# Patient Record
Sex: Male | Born: 1950 | Hispanic: No | Smoking: Former smoker
Health system: Southern US, Community
[De-identification: ages and names within clinical notes are randomized; demographics above are authoritative.]

## PROBLEM LIST (undated history)

## (undated) DIAGNOSIS — R011 Cardiac murmur, unspecified: Secondary | ICD-10-CM

## (undated) DIAGNOSIS — R7301 Impaired fasting glucose: Secondary | ICD-10-CM

## (undated) DIAGNOSIS — K4090 Unilateral inguinal hernia, without obstruction or gangrene, not specified as recurrent: Secondary | ICD-10-CM

## (undated) DIAGNOSIS — K219 Gastro-esophageal reflux disease without esophagitis: Secondary | ICD-10-CM

## (undated) DIAGNOSIS — I2510 Atherosclerotic heart disease of native coronary artery without angina pectoris: Secondary | ICD-10-CM

## (undated) DIAGNOSIS — I10 Essential (primary) hypertension: Secondary | ICD-10-CM

## (undated) DIAGNOSIS — E785 Hyperlipidemia, unspecified: Secondary | ICD-10-CM

## (undated) DIAGNOSIS — K429 Umbilical hernia without obstruction or gangrene: Secondary | ICD-10-CM

## (undated) DIAGNOSIS — E559 Vitamin D deficiency, unspecified: Secondary | ICD-10-CM

## (undated) DIAGNOSIS — I509 Heart failure, unspecified: Secondary | ICD-10-CM

## (undated) DIAGNOSIS — I350 Nonrheumatic aortic (valve) stenosis: Secondary | ICD-10-CM

## (undated) DIAGNOSIS — E782 Mixed hyperlipidemia: Secondary | ICD-10-CM

## (undated) HISTORY — DX: Vitamin D deficiency, unspecified: E55.9

## (undated) HISTORY — DX: Cardiac murmur, unspecified: R01.1

## (undated) HISTORY — DX: Gastro-esophageal reflux disease without esophagitis: K21.9

## (undated) HISTORY — DX: Unilateral inguinal hernia, without obstruction or gangrene, not specified as recurrent: K40.90

## (undated) HISTORY — DX: Hyperlipidemia, unspecified: E78.5

## (undated) HISTORY — DX: Impaired fasting glucose: R73.01

## (undated) HISTORY — PX: CATARACT EXTRACTION, BILATERAL: SHX1313

## (undated) HISTORY — DX: Essential (primary) hypertension: I10

## (undated) HISTORY — DX: Atherosclerotic heart disease of native coronary artery without angina pectoris: I25.10

## (undated) HISTORY — PX: COLONOSCOPY: SHX5424

## (undated) HISTORY — DX: Mixed hyperlipidemia: E78.2

---

## 2005-06-21 ENCOUNTER — Ambulatory Visit: Payer: Self-pay | Admitting: Cardiovascular Disease

## 2005-07-05 ENCOUNTER — Ambulatory Visit: Payer: Self-pay

## 2012-04-27 DIAGNOSIS — Z0279 Encounter for issue of other medical certificate: Secondary | ICD-10-CM

## 2015-05-25 DIAGNOSIS — K4090 Unilateral inguinal hernia, without obstruction or gangrene, not specified as recurrent: Secondary | ICD-10-CM | POA: Insufficient documentation

## 2015-05-25 DIAGNOSIS — R011 Cardiac murmur, unspecified: Secondary | ICD-10-CM | POA: Insufficient documentation

## 2015-05-25 DIAGNOSIS — E559 Vitamin D deficiency, unspecified: Secondary | ICD-10-CM | POA: Insufficient documentation

## 2015-05-25 DIAGNOSIS — K219 Gastro-esophageal reflux disease without esophagitis: Secondary | ICD-10-CM | POA: Insufficient documentation

## 2015-05-25 DIAGNOSIS — R7301 Impaired fasting glucose: Secondary | ICD-10-CM | POA: Insufficient documentation

## 2015-05-25 DIAGNOSIS — E782 Mixed hyperlipidemia: Secondary | ICD-10-CM | POA: Insufficient documentation

## 2015-05-25 DIAGNOSIS — I10 Essential (primary) hypertension: Secondary | ICD-10-CM | POA: Insufficient documentation

## 2015-05-25 NOTE — Progress Notes (Signed)
Patient ID: Daniel Mann, male   DOB: 1951-04-25, 64 y.o.   MRN: [id number]     Cardiology Office Note   Date:  05/28/2015   ID:  Daniel Mann, DOB 1951/03/27, MRN [id number]  PCP:  Blane Ohara, MD  Cardiologist:   Charlton Haws, MD   No chief complaint on file.     History of Present Illness: Daniel Mann is a 64 y.o. male who presents for evaluation of HTN and murmur.  Patient also has elevated lipids and HTN.  I took care of his father who passed two years ago BP not ideally controlled  Sedentary and poor diet  Works at [employer] all the time.  On ACE and started on amlodipine recently but not clear that he is taking it.  Reviewed labs from Dr Cox and LDL 138 with high LDL-P 1929.  He has been intolerant to lipitor and ? Livalo.  And crestor also made his legs hurt.  No history of chest pain, CAD palpitations or dyspnea.  Has had a murmur a long time but no formal evaluation No history of rheumatic fever, SBE.      Past Medical History  Diagnosis Date  . Hypertension   . Hyperlipidemia   . Heart murmur   . GERD (gastroesophageal reflux disease)   . Hernia, inguinal, right   . Impaired fasting glucose   . Vitamin D deficiency   . Mixed hyperlipidemia     Past Surgical History  Procedure Laterality Date  . Colonoscopy       Current Outpatient Prescriptions  Medication Sig Dispense Refill  . amLODipine (NORVASC) 5 MG tablet Take 5 mg by mouth daily.    Marland Kitchen co-enzyme Q-10 30 MG capsule Take 30 mg by mouth 3 (three) times daily.    . ergocalciferol (VITAMIN D2) 50000 UNITS capsule Take 50,000 Units by mouth once a week.    . esomeprazole (NEXIUM) 40 MG capsule Take 40 mg by mouth daily at 12 noon.    Marland Kitchen lisinopril (PRINIVIL,ZESTRIL) 40 MG tablet Take 40 mg by mouth 2 (two) times daily.    . simvastatin (ZOCOR) 5 MG tablet Take 1 tablet (5 mg total) by mouth daily. 30 tablet 6   No current facility-administered medications for this visit.    Allergies:   Review of patient's  allergies indicates no known allergies.    Social History:  The patient  reports that he has quit smoking. He does not have any smokeless tobacco history on file. He reports that he drinks alcohol.   Family History:  The patient's family history includes Alzheimer's disease in his mother; Cancer - Prostate in his father; Renal Disease in his father.    ROS:  Please see the history of present illness.   Otherwise, review of systems are positive for none.   All other systems are reviewed and negative.    PHYSICAL EXAM: VS:  BP 126/82 mmHg  Pulse 78  Ht 6' (1.829 m)  Wt 113.49 kg (250 lb 3.2 oz)  BMI 33.93 kg/m2  SpO2 97% , BMI Body mass index is 33.93 kg/(m^2). Affect appropriate Healthy:  appears stated age HEENT: normal Neck supple with no adenopathy JVP normal no bruits no thyromegaly Lungs clear with no wheezing and good diaphragmatic motion Heart:  S1/S2 no murmur, no rub, gallop or click PMI normal Abdomen: benighn, BS positve, no tenderness, no AAA no bruit.  No HSM or HJR Distal pulses intact with no bruits No edema Neuro non-focal Skin warm and  dry No muscular weakness    EKG:  NSR possible old IMI    Recent Labs: No results found for requested labs within last 365 days.    Lipid Panel No results found for: CHOL, TRIG, HDL, CHOLHDL, VLDL, LDLCALC, LDLDIRECT    Wt Readings from Last 3 Encounters:  05/28/15 113.49 kg (250 lb 3.2 oz)      Other studies Reviewed: Additional studies/ records that were reviewed today include: Records from [facility] practice Dr Sedalia Muta.    ASSESSMENT AND PLAN:  1.  Murmur:  AS/Aortic sclerosis murmur f/u echo r/o bicuspid valve no need for SBE 2. HTN:  F/u Dr Sedalia Muta encouraged compliance and lifestyle modifications as well as increased exercise continue ACe 3. Chol:  Encouraged him to have coronary calcium score suspect it won't be low/0 will give him more incentive to Rx risk factors Call in zocor 5 mg qod for starters.   May need referral to lipid clinic  4.  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  The patient does not have concerns regarding medicines.  The following changes have been made:  Zocor 5 qod   Labs/ tests ordered today include: Calcium Score and Echo   Orders Placed This Encounter  Procedures  . Lipid panel  . Hepatic function panel  . EKG 12-Lead  . ECHOCARDIOGRAM COMPLETE     Disposition:   FU with me 3 months      Signed, Charlton Haws, MD  05/28/2015 3:34 PM    [facility] [address] [place], [place], [place]  [address] Phone: [phone]; Fax: [phone]

## 2015-05-28 ENCOUNTER — Ambulatory Visit (INDEPENDENT_AMBULATORY_CARE_PROVIDER_SITE_OTHER): Payer: BLUE CROSS/BLUE SHIELD | Admitting: Cardiovascular Disease

## 2015-05-28 ENCOUNTER — Encounter: Payer: Self-pay | Admitting: Cardiovascular Disease

## 2015-05-28 ENCOUNTER — Ambulatory Visit (INDEPENDENT_AMBULATORY_CARE_PROVIDER_SITE_OTHER)
Admission: RE | Admit: 2015-05-28 | Discharge: 2015-05-28 | Disposition: A | Discharge status: Home / Self Care | Payer: BLUE CROSS/BLUE SHIELD | Source: Ambulatory Visit | Attending: Cardiovascular Disease | Admitting: Cardiovascular Disease

## 2015-05-28 VITALS — BP 126/82 | HR 78 | Ht 72.0 in | Wt 250.2 lb

## 2015-05-28 DIAGNOSIS — Z79899 Other long term (current) drug therapy: Secondary | ICD-10-CM

## 2015-05-28 DIAGNOSIS — I350 Nonrheumatic aortic (valve) stenosis: Secondary | ICD-10-CM

## 2015-05-28 DIAGNOSIS — R011 Cardiac murmur, unspecified: Secondary | ICD-10-CM

## 2015-05-28 MED ORDER — SIMVASTATIN 5 MG PO TABS: 5.0000 mg | ORAL_TABLET | Freq: Every day | ORAL | Status: DC

## 2015-05-28 NOTE — Patient Instructions (Signed)
Medication Instructions:  SIMVASTATIN   5 MG  EVERY OTHER DAY  Labwork: LIPID LIVER IN  3 MONTHS   DUE DECEMBER  Testing/Procedures: Your physician has requested that you have an echocardiogram. Echocardiography is a painless test that uses sound waves to create images of your heart. It provides your doctor with information about the size and shape of your heart and how well your heart's chambers and valves are working. This procedure takes approximately one hour. There are no restrictions for this procedure.   Follow-Up: Your physician recommends that you schedule a follow-up appointment in: 3 MONTHS  WITH  DR Eden Emms  Any Other Special Instructions Will Be Listed Below (If Applicable). MD  ALSO  RECOMMENDS  CALCIUM  SCORE OUT OF  POCKET  EXPENSE OF  $150.00

## 2015-06-09 ENCOUNTER — Other Ambulatory Visit: Payer: Self-pay

## 2015-06-09 ENCOUNTER — Ambulatory Visit (HOSPITAL_COMMUNITY): Payer: BLUE CROSS/BLUE SHIELD | Attending: Cardiovascular Disease

## 2015-06-09 DIAGNOSIS — I371 Nonrheumatic pulmonary valve insufficiency: Secondary | ICD-10-CM | POA: Insufficient documentation

## 2015-06-09 DIAGNOSIS — I340 Nonrheumatic mitral (valve) insufficiency: Secondary | ICD-10-CM | POA: Diagnosis not present

## 2015-06-09 DIAGNOSIS — E785 Hyperlipidemia, unspecified: Secondary | ICD-10-CM | POA: Diagnosis not present

## 2015-06-09 DIAGNOSIS — I10 Essential (primary) hypertension: Secondary | ICD-10-CM | POA: Diagnosis not present

## 2015-06-09 DIAGNOSIS — I350 Nonrheumatic aortic (valve) stenosis: Secondary | ICD-10-CM | POA: Insufficient documentation

## 2015-06-09 DIAGNOSIS — R011 Cardiac murmur, unspecified: Secondary | ICD-10-CM | POA: Diagnosis not present

## 2015-06-11 ENCOUNTER — Telehealth: Payer: Self-pay | Admitting: Cardiovascular Disease

## 2015-06-11 NOTE — Telephone Encounter (Signed)
New message ° ° °Patient returning call back to nurse.  °

## 2015-06-11 NOTE — Telephone Encounter (Signed)
PT AWARE OF ECHO RESULTS./CY 

## 2015-07-20 ENCOUNTER — Telehealth: Payer: Self-pay | Admitting: Cardiovascular Disease

## 2015-07-20 NOTE — Telephone Encounter (Signed)
NO  RECENT   CALCIUM LEVEL   LAST  LABS  WERE  SCANNED FROM [facility] .Daniel Mann/CY

## 2015-07-20 NOTE — Telephone Encounter (Signed)
New message      Calling to get patients last calcium level results

## 2015-07-20 NOTE — Telephone Encounter (Signed)
New message     Please fax calcium score results to the office.  Fax number is [phone]---Attn Dr Cox---please mark it urgent

## 2015-08-19 NOTE — Progress Notes (Signed)
Patient ID: Daniel Mann, male   DOB: 02/25/1951, 64 y.o.   MRN: [id number]     Cardiology Office Note   Date:  08/31/2015   ID:  Daniel Mann, DOB 02/25/1951, MRN [id number]  PCP:  Blane OharaOX,KIRSTEN, MD  Cardiologist:   Charlton HawsPeter Cortavius Montesinos, MD   Chief Complaint  Patient presents with  . Heart Murmur    no refills      History of Present Illness: Daniel Mann is a 64 y.o. male who presents for evaluation of HTN and murmur.  Patient also has elevated lipids and HTN.  I took care of his father who passed two years ago BP not ideally controlled  Sedentary and poor diet  Works at [employer] all the time.  On ACE and started on amlodipine recently but not clear that he is taking it.  Reviewed labs from Dr Cox and LDL 138 with high LDL-P 1929.  He has been intolerant to lipitor and ? Livalo.  And crestor also made his legs hurt.  No history of chest pain, CAD palpitations or dyspnea.  Has had a murmur a long time but no formal evaluation No history of rheumatic fever, SBE.     Calcium Score 05/28/15 :   IMPRESSION: Coronary calcium score of 26. This was 32nd percentile for age and sex matched control. Heavily calcified aortic valve Patient will have echo to assess the degree of aortic stenosis  Echo 06/09/15 moderate to severe AS Study Conclusions  - Left ventricle: The cavity size was normal. Systolic function was normal. The estimated ejection fraction was in the range of 60% to 65%. Wall motion was normal; there were no regional wall motion abnormalities. - Aortic valve: Severe diffuse thickening and calcification. There was moderate to severe stenosis. - Mitral valve: There was trivial regurgitation. - Pulmonic valve: There was trivial regurgitation.    Past Medical History  Diagnosis Date  . Hypertension   . Hyperlipidemia   . Heart murmur   . GERD (gastroesophageal reflux disease)   . Hernia, inguinal, right   . Impaired fasting glucose   . Vitamin D deficiency   . Mixed  hyperlipidemia     Past Surgical History  Procedure Laterality Date  . Colonoscopy       Current Outpatient Prescriptions  Medication Sig Dispense Refill  . amLODipine (NORVASC) 5 MG tablet Take 5 mg by mouth daily.    Marland Kitchen. co-enzyme Q-10 30 MG capsule Take 30 mg by mouth 3 (three) times daily.    . ergocalciferol (VITAMIN D2) 50000 UNITS capsule Take 50,000 Units by mouth once a week.    . esomeprazole (NEXIUM) 40 MG capsule Take 40 mg by mouth daily at 12 noon.    Marland Kitchen. lisinopril (PRINIVIL,ZESTRIL) 40 MG tablet Take 40 mg by mouth 2 (two) times daily.    . simvastatin (ZOCOR) 5 MG tablet Take 1 tablet (5 mg total) by mouth daily. 30 tablet 6   No current facility-administered medications for this visit.    Allergies:   Review of patient's allergies indicates no known allergies.    Social History:  The patient  reports that he has quit smoking. He does not have any smokeless tobacco history on file. He reports that he drinks alcohol.   Family History:  The patient's family history includes Alzheimer's disease in his mother; Cancer - Prostate in his father; Renal Disease in his father.    ROS:  Please see the history of present illness.   Otherwise, review of systems  are positive for none.   All other systems are reviewed and negative.    PHYSICAL EXAM: VS:  BP 118/70 mmHg  Pulse 67  Ht 6' (1.829 m)  Wt 106.958 kg (235 lb 12.8 oz)  BMI 31.97 kg/m2 , BMI Body mass index is 31.97 kg/(m^2). Affect appropriate Healthy:  appears stated age HEENT: normal Neck supple with no adenopathy JVP normal no bruits no thyromegaly Lungs clear with no wheezing and good diaphragmatic motion Heart:  S1/S2 no murmur, no rub, gallop or click PMI normal Abdomen: benighn, BS positve, no tenderness, no AAA no bruit.  No HSM or HJR Distal pulses intact with no bruits No edema Neuro non-focal Skin warm and dry No muscular weakness    EKG:  NSR possible old IMI    Recent Labs: No results  found for requested labs within last 365 days.    Lipid Panel No results found for: CHOL, TRIG, HDL, CHOLHDL, VLDL, LDLCALC, LDLDIRECT    Wt Readings from Last 3 Encounters:  08/31/15 106.958 kg (235 lb 12.8 oz)  05/28/15 113.49 kg (250 lb 3.2 oz)      Other studies Reviewed: Additional studies/ records that were reviewed today include: Records from [facility] practice Dr Sedalia Muta.    ASSESSMENT AND PLAN:  1.  Murmur:  AS moderate to severe f/u echo in 6 months  Mean gradient 33 mmHg  Sept 16  2. HTN:  F/u Dr Sedalia Muta encouraged compliance and lifestyle modifications as well as increased exercise continue ACe and calcium blocker improved  3. Chol:  Encouraged him to have coronary calcium score suspect it won't be low/0 will give him more incentive to Rx risk factors Labs today continue low dose zocor 4.  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  The patient does not have concerns regarding medicines.  Lexiscan myovue ordered    No orders of the defined types were placed in this encounter.     Disposition:   FU with me 6 months  With echo     Signed, Charlton Haws, MD  08/31/2015 8:17 AM    [facility] [address] [place], [place], [place]  [address] Phone: [phone]; Fax: [phone]

## 2015-08-31 ENCOUNTER — Other Ambulatory Visit (INDEPENDENT_AMBULATORY_CARE_PROVIDER_SITE_OTHER): Payer: BLUE CROSS/BLUE SHIELD | Admitting: *Deleted

## 2015-08-31 ENCOUNTER — Ambulatory Visit (INDEPENDENT_AMBULATORY_CARE_PROVIDER_SITE_OTHER): Payer: BLUE CROSS/BLUE SHIELD | Admitting: Cardiovascular Disease

## 2015-08-31 ENCOUNTER — Encounter: Payer: Self-pay | Admitting: Cardiovascular Disease

## 2015-08-31 VITALS — BP 118/70 | HR 67 | Ht 72.0 in | Wt 235.8 lb

## 2015-08-31 DIAGNOSIS — R011 Cardiac murmur, unspecified: Secondary | ICD-10-CM

## 2015-08-31 DIAGNOSIS — R0600 Dyspnea, unspecified: Secondary | ICD-10-CM

## 2015-08-31 DIAGNOSIS — Z79899 Other long term (current) drug therapy: Secondary | ICD-10-CM

## 2015-08-31 DIAGNOSIS — I10 Essential (primary) hypertension: Secondary | ICD-10-CM

## 2015-08-31 LAB — HEPATIC FUNCTION PANEL
ALT: 24 U/L (ref 9–46)
AST: 24 U/L (ref 10–35)
Albumin: 4.1 g/dL (ref 3.6–5.1)
Alkaline Phosphatase: 74 U/L (ref 40–115)
BILIRUBIN DIRECT: 0.1 mg/dL (ref <=0.2)
BILIRUBIN INDIRECT: 0.5 mg/dL (ref 0.2–1.2)
TOTAL PROTEIN: 6.9 g/dL (ref 6.1–8.1)
Total Bilirubin: 0.6 mg/dL (ref 0.2–1.2)

## 2015-08-31 LAB — LIPID PANEL
CHOLESTEROL: 180 mg/dL (ref 125–200)
HDL: 51 mg/dL (ref >=40)
LDL CALC: 113 mg/dL (ref <130)
TRIGLYCERIDES: 80 mg/dL (ref <150)
Total CHOL/HDL Ratio: 3.5 Ratio (ref <=5.0)
VLDL: 16 mg/dL (ref <30)

## 2015-08-31 NOTE — Patient Instructions (Signed)
Medication Instructions:  Your physician recommends that you continue on your current medications as directed. Please refer to the Current Medication list given to you today.  Labwork: NONE  Testing/Procedures: Your physician has requested that you have a lexiscan myoview. For further information please visit [email or web address]. Please follow instruction sheet, as given.   Follow-Up: Your physician wants you to follow-up in: 6 MONTHS  WITH DR Eden EmmsNISHAN ECHO  SAME  DAY   You will receive a reminder letter in the mail two months in advance. If you don't receive a letter, please call our office to schedule the follow-up appointment.  Any Other Special Instructions Will Be Listed Below (If Applicable).     If you need a refill on your cardiac medications before your next appointment, please call your pharmacy.

## 2015-09-01 ENCOUNTER — Telehealth: Payer: Self-pay

## 2015-09-01 NOTE — Telephone Encounter (Signed)
Patient aware of lab results.

## 2015-09-08 ENCOUNTER — Telehealth (HOSPITAL_COMMUNITY): Payer: Self-pay | Admitting: *Deleted

## 2015-09-08 NOTE — Telephone Encounter (Signed)
Patient given detailed instructions per Myocardial Perfusion Study Information Sheet for the test on 09/10/15 at 0945. Patient notified to arrive 15 minutes early and that it is imperative to arrive on time for appointment to keep from having the test rescheduled.  If you need to cancel or reschedule your appointment, please call the office within 24 hours of your appointment. Failure to do so may result in a cancellation of your appointment, and a $50 no show fee. Patient verbalized understanding.Alexey Rhoads, Adelene IdlerCynthia W

## 2015-09-08 NOTE — Telephone Encounter (Signed)
Left message on voicemail in reference to upcoming appointment scheduled for 09/10/15. Phone number given for a call back so details instructions can be given. Kimiyah Blick W    

## 2015-09-10 ENCOUNTER — Ambulatory Visit (HOSPITAL_COMMUNITY): Payer: BLUE CROSS/BLUE SHIELD | Attending: Cardiovascular Disease

## 2015-09-10 DIAGNOSIS — R0600 Dyspnea, unspecified: Secondary | ICD-10-CM | POA: Diagnosis not present

## 2015-09-10 DIAGNOSIS — I10 Essential (primary) hypertension: Secondary | ICD-10-CM | POA: Diagnosis not present

## 2015-09-10 LAB — MYOCARDIAL PERFUSION IMAGING
CHL CUP NUCLEAR SDS: 0
CHL CUP NUCLEAR SRS: 3
CHL CUP NUCLEAR SSS: 3
CHL CUP RESTING HR STRESS: 60 {beats}/min
CHL CUP STRESS STAGE 1 GRADE: 0 %
CHL CUP STRESS STAGE 1 HR: 61 {beats}/min
CHL CUP STRESS STAGE 1 SBP: 115 mmHg
CHL CUP STRESS STAGE 2 HR: 62 {beats}/min
CHL CUP STRESS STAGE 2 SPEED: 0 mph
CHL CUP STRESS STAGE 3 DBP: 92 mmHg
CHL CUP STRESS STAGE 5 GRADE: 0 %
CHL CUP STRESS STAGE 5 HR: 74 {beats}/min
CHL CUP STRESS STAGE 5 SPEED: 0 mph
CHL CUP STRESS STAGE 6 DBP: 80 mmHg
CHL CUP STRESS STAGE 6 HR: 75 {beats}/min
CHL CUP STRESS STAGE 6 SBP: 112 mmHg
CSEPEW: 1 METS
CSEPPHR: 75 {beats}/min
CSEPPMHR: 48 %
LV sys vol: 42 mL
LVDIAVOL: 106 mL
RATE: 0.37
Stage 1 DBP: 73 mmHg
Stage 1 Speed: 0 mph
Stage 2 Grade: 0 %
Stage 3 Grade: 0 %
Stage 3 HR: 86 {beats}/min
Stage 3 SBP: 129 mmHg
Stage 3 Speed: 0 mph
Stage 4 Grade: 0 %
Stage 4 HR: 75 {beats}/min
Stage 4 Speed: 0 mph
Stage 5 DBP: 84 mmHg
Stage 5 SBP: 112 mmHg
Stage 6 Grade: 0 %
Stage 6 Speed: 0 mph
TID: 0.96

## 2015-09-10 MED ORDER — TECHNETIUM TC 99M SESTAMIBI GENERIC - CARDIOLITE
10.8000 | Freq: Once | INTRAVENOUS | Status: AC | PRN
  Administered 2015-09-10: 11 via INTRAVENOUS

## 2015-09-10 MED ORDER — TECHNETIUM TC 99M SESTAMIBI GENERIC - CARDIOLITE
33.0000 | Freq: Once | INTRAVENOUS | Status: AC | PRN
  Administered 2015-09-10: 33 via INTRAVENOUS

## 2015-09-10 MED ORDER — REGADENOSON 0.4 MG/5ML IV SOLN
0.4000 mg | Freq: Once | INTRAVENOUS | Status: AC
  Administered 2015-09-10: 0.4 mg via INTRAVENOUS

## 2015-12-26 ENCOUNTER — Other Ambulatory Visit: Payer: Self-pay | Admitting: Cardiovascular Disease

## 2016-02-17 DIAGNOSIS — R7301 Impaired fasting glucose: Secondary | ICD-10-CM | POA: Diagnosis not present

## 2016-02-17 DIAGNOSIS — K219 Gastro-esophageal reflux disease without esophagitis: Secondary | ICD-10-CM | POA: Diagnosis not present

## 2016-02-17 DIAGNOSIS — I10 Essential (primary) hypertension: Secondary | ICD-10-CM | POA: Diagnosis not present

## 2016-02-17 DIAGNOSIS — E782 Mixed hyperlipidemia: Secondary | ICD-10-CM | POA: Diagnosis not present

## 2016-05-27 ENCOUNTER — Encounter: Payer: Self-pay | Admitting: Cardiovascular Disease

## 2016-05-31 DIAGNOSIS — Z23 Encounter for immunization: Secondary | ICD-10-CM | POA: Diagnosis not present

## 2016-06-10 ENCOUNTER — Encounter (INDEPENDENT_AMBULATORY_CARE_PROVIDER_SITE_OTHER): Payer: Self-pay

## 2016-06-10 ENCOUNTER — Encounter: Payer: Self-pay | Admitting: Cardiovascular Disease

## 2016-06-10 ENCOUNTER — Ambulatory Visit (INDEPENDENT_AMBULATORY_CARE_PROVIDER_SITE_OTHER): Payer: BLUE CROSS/BLUE SHIELD | Admitting: Cardiovascular Disease

## 2016-06-10 VITALS — BP 140/76 | HR 82 | Ht 72.0 in | Wt 249.4 lb

## 2016-06-10 DIAGNOSIS — I350 Nonrheumatic aortic (valve) stenosis: Secondary | ICD-10-CM | POA: Diagnosis not present

## 2016-06-10 DIAGNOSIS — R011 Cardiac murmur, unspecified: Secondary | ICD-10-CM

## 2016-06-10 MED ORDER — SIMVASTATIN 5 MG PO TABS: 5.0000 mg | ORAL_TABLET | Freq: Every day | ORAL | 3 refills | Status: DC

## 2016-06-10 NOTE — Patient Instructions (Addendum)

## 2016-06-10 NOTE — Progress Notes (Signed)
Patient ID: Daniel Mann, male   DOB: 09-Feb-1951, 65 y.o.   MRN: [id number]     Cardiology Office Note   Date:  06/10/2016   ID:  Daniel Mann, DOB 1951/06/08, MRN [id number]  PCP:  Blane Ohara, MD  Cardiologist:   Charlton Haws, MD   Chief Complaint  Patient presents with  . Heart Murmur      History of Present Illness: Daniel Mann is a 65 y.o. male who presents for evaluation of HTN and murmur.  Patient also has elevated lipids and HTN.  I took care of his father who passed two years ago BP not ideally controlled  Sedentary and poor diet  Works at [employer] all the time.  On ACE and started on amlodipine recently but not clear that he is taking it.  Reviewed labs from Dr Cox and LDL 138 with high LDL-P 1929.  He has been intolerant to lipitor and ? Livalo.  And crestor also made his legs hurt.  No history of chest pain, CAD palpitations or dyspnea.  Has had a murmur a long time but no formal evaluation No history of rheumatic fever, SBE.     Calcium Score 05/28/15 :   IMPRESSION: Coronary calcium score of 26. This was 32nd percentile for age and sex matched control. Heavily calcified aortic valve Patient will have echo to assess the degree of aortic stenosis  08/21/15 Myovue normal no ischemia EF 60%   Echo 06/09/15 moderate to severe AS Study Conclusions  - Left ventricle: The cavity size was normal. Systolic function was normal. The estimated ejection fraction was in the range of 60% to 65%. Wall motion was normal; there were no regional wall motion abnormalities. - Aortic valve: Severe diffuse thickening and calcification. There was moderate to severe stenosis. - Mitral valve: There was trivial regurgitation. - Pulmonic valve: There was trivial regurgitation.  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 GERD (gastroesophageal reflux disease)   . Heart murmur   .  Hernia, inguinal, right   . Hyperlipidemia   . Hypertension   . Impaired fasting glucose   . Mixed hyperlipidemia   . Vitamin D deficiency     Past Surgical History:  Procedure Laterality Date  . COLONOSCOPY       Current Outpatient Prescriptions  Medication Sig Dispense Refill  . amLODipine (NORVASC) 5 MG tablet Take 5 mg by mouth daily.    Marland Kitchen co-enzyme Q-10 30 MG capsule Take 30 mg by mouth 3 (three) times daily.    . ergocalciferol (VITAMIN D2) 50000 UNITS capsule Take 50,000 Units by mouth once a week.    . esomeprazole (NEXIUM) 40 MG capsule Take 40 mg by mouth daily at 12 noon.    Marland Kitchen lisinopril (PRINIVIL,ZESTRIL) 40 MG tablet Take 40 mg by mouth 2 (two) times daily.    . simvastatin (ZOCOR) 5 MG tablet TAKE ONE TABLET BY MOUTH DAILY  30 tablet 6   No current facility-administered medications for this visit.     Allergies:   Review of patient's allergies indicates no known allergies.    Social History:  The patient  reports that he has quit smoking. He has never used smokeless tobacco. He reports that he drinks alcohol. He reports that he does not use drugs.   Family History:  The patient's family history includes Alzheimer's disease in his mother; Cancer - Prostate in  his father; Renal Disease in his father.    ROS:  Please see the history of present illness.   Otherwise, review of systems are positive for none.   All other systems are reviewed and negative.    PHYSICAL EXAM: VS:  BP 140/76   Pulse 82   Ht 6' (1.829 m)   Wt 113.1 kg (249 lb 6.4 oz)   SpO2 95%   BMI 33.82 kg/m  , BMI Body mass index is 33.82 kg/m. Affect appropriate Healthy:  appears stated age HEENT: normal Neck supple with no adenopathy JVP normal no bruits no thyromegaly Lungs clear with no wheezing and good diaphragmatic motion Heart:  S1/S2 no murmur, no rub, gallop or click PMI normal Abdomen: benighn, BS positve, no tenderness, no AAA no bruit.  No HSM or HJR Distal pulses intact with  no bruits No edema Neuro non-focal Skin warm and dry No muscular weakness    EKG:  NSR possible old IMI  06/10/16  SR rate 76 normal ECG    Recent Labs: 08/31/2015: ALT 24    Lipid Panel    Component Value Date/Time   CHOL 180 08/31/2015 0814   TRIG 80 08/31/2015 0814   HDL 51 08/31/2015 0814   CHOLHDL 3.5 08/31/2015 0814   VLDL 16 08/31/2015 0814   LDLCALC 113 08/31/2015 0814      Wt Readings from Last 3 Encounters:  06/10/16 113.1 kg (249 lb 6.4 oz)  09/10/15 106.6 kg (235 lb)  08/31/15 107 kg (235 lb 12.8 oz)      Other studies Reviewed: Additional studies/ records that were reviewed today include: [facility] Dr Sedalia Mutaox.    ASSESSMENT AND PLAN:  1.  Murmur:  AS moderate to severe f/u echo   Mean gradient 33 mmHg  Sept 16  2. HTN:  F/u Dr Sedalia Mutaox encouraged compliance and lifestyle modifications as well as increased exercise continue ACe and calcium blocker improved  3. Chol:  Encouraged him to have coronary calcium score suspect it won't be low/0 will give him more incentive to Rx risk factors Labs today continue low dose zocor 4.  ECG normal today myovue with no old MI improved     Current medicines are reviewed at length with the patient today.  The patient does not have concerns regarding medicines.  Lexiscan myovue ordered    Orders Placed This Encounter  Procedures  . EKG 12-Lead     Disposition:   FU with me 6 months  With echo     Signed, Charlton HawsPeter Nacole Fluhr, MD  06/10/2016 8:10 AM    [facility] [address], [place], [place]  [address] Phone: [phone]; Fax: [phone]

## 2016-06-22 ENCOUNTER — Other Ambulatory Visit: Payer: Self-pay

## 2016-06-22 ENCOUNTER — Ambulatory Visit (HOSPITAL_COMMUNITY): Payer: BLUE CROSS/BLUE SHIELD | Attending: Cardiovascular Disease

## 2016-06-22 DIAGNOSIS — R011 Cardiac murmur, unspecified: Secondary | ICD-10-CM | POA: Insufficient documentation

## 2016-06-22 DIAGNOSIS — I350 Nonrheumatic aortic (valve) stenosis: Secondary | ICD-10-CM | POA: Insufficient documentation

## 2016-06-28 DIAGNOSIS — L821 Other seborrheic keratosis: Secondary | ICD-10-CM | POA: Diagnosis not present

## 2016-06-28 DIAGNOSIS — D224 Melanocytic nevi of scalp and neck: Secondary | ICD-10-CM | POA: Diagnosis not present

## 2016-06-28 DIAGNOSIS — C44519 Basal cell carcinoma of skin of other part of trunk: Secondary | ICD-10-CM | POA: Diagnosis not present

## 2016-06-28 DIAGNOSIS — L570 Actinic keratosis: Secondary | ICD-10-CM | POA: Diagnosis not present

## 2016-07-05 DIAGNOSIS — C44519 Basal cell carcinoma of skin of other part of trunk: Secondary | ICD-10-CM | POA: Diagnosis not present

## 2016-08-03 ENCOUNTER — Ambulatory Visit: Payer: BLUE CROSS/BLUE SHIELD | Admitting: Cardiovascular Disease

## 2016-08-08 DIAGNOSIS — N401 Enlarged prostate with lower urinary tract symptoms: Secondary | ICD-10-CM | POA: Diagnosis not present

## 2016-08-12 DIAGNOSIS — K4090 Unilateral inguinal hernia, without obstruction or gangrene, not specified as recurrent: Secondary | ICD-10-CM | POA: Diagnosis not present

## 2016-08-12 DIAGNOSIS — N401 Enlarged prostate with lower urinary tract symptoms: Secondary | ICD-10-CM | POA: Diagnosis not present

## 2016-08-12 DIAGNOSIS — N411 Chronic prostatitis: Secondary | ICD-10-CM | POA: Diagnosis not present

## 2016-08-12 DIAGNOSIS — R351 Nocturia: Secondary | ICD-10-CM | POA: Diagnosis not present

## 2016-10-19 DIAGNOSIS — R7301 Impaired fasting glucose: Secondary | ICD-10-CM | POA: Diagnosis not present

## 2016-10-19 DIAGNOSIS — Z23 Encounter for immunization: Secondary | ICD-10-CM | POA: Diagnosis not present

## 2016-10-19 DIAGNOSIS — Z0000 Encounter for general adult medical examination without abnormal findings: Secondary | ICD-10-CM | POA: Diagnosis not present

## 2016-10-19 DIAGNOSIS — Z6833 Body mass index (BMI) 33.0-33.9, adult: Secondary | ICD-10-CM | POA: Diagnosis not present

## 2016-10-31 DIAGNOSIS — H6123 Impacted cerumen, bilateral: Secondary | ICD-10-CM | POA: Diagnosis not present

## 2016-11-01 DIAGNOSIS — L570 Actinic keratosis: Secondary | ICD-10-CM | POA: Diagnosis not present

## 2016-11-01 DIAGNOSIS — B360 Pityriasis versicolor: Secondary | ICD-10-CM | POA: Diagnosis not present

## 2016-11-01 DIAGNOSIS — L821 Other seborrheic keratosis: Secondary | ICD-10-CM | POA: Diagnosis not present

## 2017-02-07 DIAGNOSIS — R7301 Impaired fasting glucose: Secondary | ICD-10-CM | POA: Diagnosis not present

## 2017-02-07 DIAGNOSIS — I10 Essential (primary) hypertension: Secondary | ICD-10-CM | POA: Diagnosis not present

## 2017-02-07 DIAGNOSIS — I350 Nonrheumatic aortic (valve) stenosis: Secondary | ICD-10-CM | POA: Diagnosis not present

## 2017-02-07 DIAGNOSIS — E782 Mixed hyperlipidemia: Secondary | ICD-10-CM | POA: Diagnosis not present

## 2017-03-17 ENCOUNTER — Other Ambulatory Visit: Payer: Self-pay | Admitting: Cardiovascular Disease

## 2017-05-11 DIAGNOSIS — I10 Essential (primary) hypertension: Secondary | ICD-10-CM | POA: Diagnosis not present

## 2017-05-11 DIAGNOSIS — E782 Mixed hyperlipidemia: Secondary | ICD-10-CM | POA: Diagnosis not present

## 2017-05-11 DIAGNOSIS — Z23 Encounter for immunization: Secondary | ICD-10-CM | POA: Diagnosis not present

## 2017-05-11 DIAGNOSIS — R7301 Impaired fasting glucose: Secondary | ICD-10-CM | POA: Diagnosis not present

## 2017-05-11 DIAGNOSIS — R42 Dizziness and giddiness: Secondary | ICD-10-CM | POA: Diagnosis not present

## 2017-05-11 DIAGNOSIS — I350 Nonrheumatic aortic (valve) stenosis: Secondary | ICD-10-CM | POA: Diagnosis not present

## 2017-05-23 NOTE — Progress Notes (Signed)
Patient ID: Daniel Mann, male   DOB: 07-23-1951, 66 y.o.   MRN: [id number]     Cardiology Office Note   Date:  06/01/2017   ID:  Daniel Mann, Daniel Mann 1951-03-20, MRN [id number]  PCP:  Blane Ohara, MD  Cardiologist:   Daniel Haws, MD   No chief complaint on file.     History of Present Illness: Daniel Mann is a 66 y.o. male f/u of moderate AS .  Patient also has elevated lipids and HTN.  I took care of his father who passed 3 years ago BP not ideally controlled  Sedentary and poor diet  Works at [employer] all the time.  On ACE and started on amlodipine recently but not clear that he is taking it.  Reviewed labs from Dr Cox and LDL 138 with high LDL-P 1929.  He has been intolerant to lipitor and ? Livalo.  And crestor also made his legs hurt.  No history of chest pain, CAD palpitations or dyspnea.  Has had a murmur a long time but no formal evaluation No history of rheumatic fever, SBE.     Calcium Score 05/28/15 :   IMPRESSION: Coronary calcium score of 26. This was 32nd percentile for age and sex matched control. 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    Past Medical History:  Diagnosis Date  . GERD (gastroesophageal reflux disease)   . Heart murmur   . Hernia, inguinal, right   . Hyperlipidemia   . Hypertension   . Impaired fasting glucose   . Mixed hyperlipidemia   . Vitamin D deficiency     Past Surgical History:  Procedure Laterality Date  . COLONOSCOPY       Current Outpatient Prescriptions  Medication Sig Dispense  Refill  . amLODipine (NORVASC) 5 MG tablet Take 5 mg by mouth daily.    . ciprofloxacin (CIPRO) 500 MG tablet Take 500 mg by mouth 2 (two) times daily as needed.    Marland Kitchen co-enzyme Q-10 30 MG capsule Take 30 mg by mouth 3 (three) times daily.    . ergocalciferol (VITAMIN D2) 50000 UNITS capsule Take 50,000 Units by mouth once a week.    . esomeprazole (NEXIUM) 40 MG capsule Take 40 mg by mouth daily at 12 noon.    Marland Kitchen lisinopril (PRINIVIL,ZESTRIL) 40 MG tablet Take 40 mg by mouth 2 (two) times daily.    . simvastatin (ZOCOR) 5 MG tablet TAKE ONE TABLET BY MOUTH DAILY  90 tablet 0   No current facility-administered medications for this visit.     Allergies:   Patient has no known allergies.    Social History:  The patient  reports that he has quit smoking. He has never used smokeless tobacco. He reports that he drinks alcohol. He reports that he does not use drugs.   Family History:  The patient's family history includes Alzheimer's disease in his mother; Cancer - Prostate in his father; Renal Disease in his father.    ROS:  Please see  the history of present illness.   Otherwise, review of systems are positive for none.   All other systems are reviewed and negative.    PHYSICAL EXAM: VS:  BP 138/70   Pulse 76   Ht 6' (1.829 m)   Wt 259 lb (117.5 kg)   BMI 35.13 kg/m  , BMI Body mass index is 35.13 kg/m. Affect appropriate Healthy:  appears stated age HEENT: normal Neck supple with no adenopathy JVP normal no bruits no thyromegaly Lungs clear with no wheezing and good diaphragmatic motion Heart:  S1/S2 preserved AS murmur, no rub, gallop or click PMI normal Abdomen: benighn, BS positve, no tenderness, no AAA no bruit.  No HSM or HJR Distal pulses intact with no bruits No edema Neuro non-focal Skin warm and dry No muscular weakness     EKG:   NSR possible old IMI      06/10/16  SR rate 76 normal ECG  06/01/17 SR rate 76 normal    Recent Labs: No results found for requested  labs within last 8760 hours.    Lipid Panel    Component Value Date/Time   CHOL 180 08/31/2015 0814   TRIG 80 08/31/2015 0814   HDL 51 08/31/2015 0814   CHOLHDL 3.5 08/31/2015 0814   VLDL 16 08/31/2015 0814   LDLCALC 113 08/31/2015 0814      Wt Readings from Last 3 Encounters:  06/01/17 259 lb (117.5 kg)  06/10/16 249 lb 6.4 oz (113.1 kg)  09/10/15 235 lb (106.6 kg)      Other studies Reviewed: Additional studies/ records that were reviewed today include: [facility] Dr Sedalia Mutaox.    ASSESSMENT AND PLAN:  1.  Aortic Stenosis : moderate AS echo 06/22/16 mean gradient 33 mmHg 2016 Echo 06/22/16 mean gradient 23 mmHg and peak 46 mmHg normal EF  F/U echo in October no symptoms   2. HTN:  F/u Dr Sedalia Mutaox encouraged compliance and lifestyle modifications as well as increased exercise continue ACe and calcium blocker improved   3. Chol: continue zocor  Calcium score 26 05/28/15 f/u labs with primary    4.  ECG normal today myovue with no old MI improved      Disposition:   FU with me in a year     Signed, Daniel HawsPeter Jhovanny Guinta, MD  06/01/2017 4:44 PM    [facility] [address], [place], [place]  [address] Phone: [phone](336) [phone]; Fax: [phone](336) [phone]

## 2017-06-01 ENCOUNTER — Ambulatory Visit (INDEPENDENT_AMBULATORY_CARE_PROVIDER_SITE_OTHER): Payer: BLUE CROSS/BLUE SHIELD | Admitting: Cardiovascular Disease

## 2017-06-01 ENCOUNTER — Encounter: Payer: Self-pay | Admitting: Cardiovascular Disease

## 2017-06-01 VITALS — BP 138/70 | HR 76 | Ht 72.0 in | Wt 259.0 lb

## 2017-06-01 DIAGNOSIS — I350 Nonrheumatic aortic (valve) stenosis: Secondary | ICD-10-CM | POA: Diagnosis not present

## 2017-06-01 NOTE — Patient Instructions (Signed)
Medication Instructions:  Your physician recommends that you continue on your current medications as directed. Please refer to the Current Medication list given to you today.  Labwork: NONE  Testing/Procedures: Your physician has requested that you have an echocardiogram in October. Echocardiography is a painless test that uses sound waves to create images of your heart. It provides your doctor with information about the size and shape of your heart and how well your heart's chambers and valves are working. This procedure takes approximately one hour. There are no restrictions for this procedure.  Follow-Up: Your physician wants you to follow-up in: 6 months with Dr. Nishan. You will receive a reminder letter in the mail two months in advance. If you don't receive a letter, please call our office to schedule the follow-up appointment.   If you need a refill on your cardiac medications before your next appointment, please call your pharmacy.    

## 2017-07-12 ENCOUNTER — Ambulatory Visit (HOSPITAL_COMMUNITY): Payer: BLUE CROSS/BLUE SHIELD | Attending: Cardiovascular Disease

## 2017-07-12 ENCOUNTER — Other Ambulatory Visit: Payer: Self-pay

## 2017-07-12 DIAGNOSIS — I350 Nonrheumatic aortic (valve) stenosis: Secondary | ICD-10-CM | POA: Insufficient documentation

## 2017-07-12 DIAGNOSIS — E785 Hyperlipidemia, unspecified: Secondary | ICD-10-CM | POA: Insufficient documentation

## 2017-07-12 DIAGNOSIS — I10 Essential (primary) hypertension: Secondary | ICD-10-CM | POA: Diagnosis not present

## 2017-07-17 ENCOUNTER — Telehealth: Payer: Self-pay | Admitting: Cardiovascular Disease

## 2017-07-17 DIAGNOSIS — I350 Nonrheumatic aortic (valve) stenosis: Secondary | ICD-10-CM

## 2017-07-17 NOTE — Telephone Encounter (Signed)
Patient returning your call for echo results. He can be reached at [phone].

## 2017-07-17 NOTE — Telephone Encounter (Signed)
Patient aware of echo results. Per Dr. Eden EmmsNishan, AS worse now in severe range Asymptomatic will likely need surgery in next 2 years f/u with me with echo in 6 months. Patient verbalized understanding.

## 2017-07-17 NOTE — Telephone Encounter (Signed)
Follow up     Pt returning call for echo results  Do not call his home number , he will not get the message

## 2017-08-11 DIAGNOSIS — N411 Chronic prostatitis: Secondary | ICD-10-CM | POA: Diagnosis not present

## 2017-08-11 DIAGNOSIS — N401 Enlarged prostate with lower urinary tract symptoms: Secondary | ICD-10-CM | POA: Diagnosis not present

## 2017-08-11 DIAGNOSIS — R351 Nocturia: Secondary | ICD-10-CM | POA: Diagnosis not present

## 2017-10-20 DIAGNOSIS — E782 Mixed hyperlipidemia: Secondary | ICD-10-CM | POA: Diagnosis not present

## 2017-10-20 DIAGNOSIS — I10 Essential (primary) hypertension: Secondary | ICD-10-CM | POA: Diagnosis not present

## 2017-10-20 DIAGNOSIS — I350 Nonrheumatic aortic (valve) stenosis: Secondary | ICD-10-CM | POA: Diagnosis not present

## 2017-10-20 DIAGNOSIS — R7301 Impaired fasting glucose: Secondary | ICD-10-CM | POA: Diagnosis not present

## 2017-11-07 DIAGNOSIS — C44519 Basal cell carcinoma of skin of other part of trunk: Secondary | ICD-10-CM | POA: Diagnosis not present

## 2017-11-07 DIAGNOSIS — L570 Actinic keratosis: Secondary | ICD-10-CM | POA: Diagnosis not present

## 2017-11-07 DIAGNOSIS — D485 Neoplasm of uncertain behavior of skin: Secondary | ICD-10-CM | POA: Diagnosis not present

## 2017-12-19 ENCOUNTER — Other Ambulatory Visit: Payer: Self-pay

## 2017-12-19 ENCOUNTER — Ambulatory Visit (HOSPITAL_COMMUNITY): Payer: BLUE CROSS/BLUE SHIELD | Attending: Cardiovascular Disease

## 2017-12-19 DIAGNOSIS — E785 Hyperlipidemia, unspecified: Secondary | ICD-10-CM | POA: Insufficient documentation

## 2017-12-19 DIAGNOSIS — I119 Hypertensive heart disease without heart failure: Secondary | ICD-10-CM | POA: Insufficient documentation

## 2017-12-19 DIAGNOSIS — R011 Cardiac murmur, unspecified: Secondary | ICD-10-CM | POA: Diagnosis not present

## 2017-12-19 DIAGNOSIS — I350 Nonrheumatic aortic (valve) stenosis: Secondary | ICD-10-CM

## 2017-12-26 DIAGNOSIS — L300 Nummular dermatitis: Secondary | ICD-10-CM | POA: Diagnosis not present

## 2017-12-26 DIAGNOSIS — D485 Neoplasm of uncertain behavior of skin: Secondary | ICD-10-CM | POA: Diagnosis not present

## 2017-12-26 DIAGNOSIS — L299 Pruritus, unspecified: Secondary | ICD-10-CM | POA: Diagnosis not present

## 2017-12-26 DIAGNOSIS — L821 Other seborrheic keratosis: Secondary | ICD-10-CM | POA: Diagnosis not present

## 2017-12-28 NOTE — Progress Notes (Signed)
Patient ID: Daniel Mann, male   DOB: Apr 05, 1951, 67 y.o.   MRN: [id number]     Cardiology Office Note   Date:  01/01/2018   ID:  Daniel Mann, Blank 1951/01/18, MRN [id number]  PCP:  Blane Ohara, MD  Cardiologist:   Charlton Haws, MD   No chief complaint on file.     History of Present Illness: 67 y.o. with moderate to severe AS. Last echo 12/19/17 mean gradient 38 mmHg peak 85 mmHg Has been asymptomatic CRF;s HTN, HLD. Intolerant to statins LDL 138.  Myovue normal 2016 Poor diet and sedentary works at [employer] over 41 years Wife with stage 4 lung cancer and disabled child with Duchenne muscular dystrophy   Discussed timing of valve replacement he has mild exertional dyspnea no chest pain or syncope Still working at [employer] 7 days / week Some leg cramps while walking Has ventral hernia    Past Medical History:  Diagnosis Date  . GERD (gastroesophageal reflux disease)   . Heart murmur   . Hernia, inguinal, right   . Hyperlipidemia   . Hypertension   . Impaired fasting glucose   . Mixed hyperlipidemia   . Vitamin D deficiency     Past Surgical History:  Procedure Laterality Date  . COLONOSCOPY       Current Outpatient Medications  Medication Sig Dispense Refill  . amLODipine (NORVASC) 10 MG tablet Take 10 mg by mouth daily.    . ciprofloxacin (CIPRO) 500 MG tablet Take 500 mg by mouth as directed. prostitis    . co-enzyme Q-10 30 MG capsule Take 30 mg by mouth 3 (three) times daily.    . ergocalciferol (VITAMIN D2) 50000 UNITS capsule Take 50,000 Units by mouth once a week.    . esomeprazole (NEXIUM) 40 MG capsule Take 40 mg by mouth daily at 12 noon.    Marland Kitchen lisinopril (PRINIVIL,ZESTRIL) 40 MG tablet Take 40 mg by mouth 2 (two) times daily.    . Pitavastatin Calcium (LIVALO) 1 MG TABS Take 1 mg by mouth as directed.     No current facility-administered medications for this visit.     Allergies:   Patient has no known allergies.    Social History:  The patient  reports  that he has quit smoking. He has never used smokeless tobacco. He reports that he drinks alcohol. He reports that he does not use drugs.   Family History:  The patient's family history includes Alzheimer's disease in his mother; Cancer - Prostate in his father; Renal Disease in his father.    ROS:  Please see the history of present illness.   Otherwise, review of systems are positive for none.   All other systems are reviewed and negative.    PHYSICAL EXAM: VS:  BP (!) 150/78   Pulse 87   Ht 6' (1.829 m)   Wt 258 lb 8 oz (117.3 kg)   SpO2 97%   BMI 35.06 kg/m  , BMI Body mass index is 35.06 kg/m. Affect appropriate Healthy:  appears stated age HEENT: normal Neck supple with no adenopathy JVP normal no bruits no thyromegaly Lungs clear with no wheezing and good diaphragmatic motion Heart:  S1/S2 preserved AS murmur, no rub, gallop or click PMI normal Abdomen: benighn, BS positve, no tenderness, no AAA no bruit.  No HSM or HJR ventral hernia  Distal pulses intact with no bruits No edema Neuro non-focal Skin warm and dry No muscular weakness     EKG:  NSR possible old IMI      06/10/16  SR rate 76 normal ECG  06/01/17 SR rate 76 normal  01/01/18 NSR rate 73 normal   Recent Labs: No results found for requested labs within last 8760 hours.    Lipid Panel    Component Value Date/Time   CHOL 180 08/31/2015 0814   TRIG 80 08/31/2015 0814   HDL 51 08/31/2015 0814   CHOLHDL 3.5 08/31/2015 0814   VLDL 16 08/31/2015 0814   LDLCALC 113 08/31/2015 0814      Wt Readings from Last 3 Encounters:  01/01/18 258 lb 8 oz (117.3 kg)  06/01/17 259 lb (117.5 kg)  06/10/16 249 lb 6.4 oz (113.1 kg)      Other studies Reviewed: Additional studies/ records that were reviewed today include: [facility] Dr Sedalia Mutaox.    ASSESSMENT AND PLAN:  1.  Aortic Stenosis :  Moderate to severe mean gradient 38 mmHg f/u echo in 6 months discussed Need for surgery within  next year or so Lexi Myovue to r/o CAD last one 3 years ago  2. HTN:  F/u Dr Sedalia Mutaox encouraged compliance and lifestyle modifications as well as increased exercise continue ACe and calcium blocker improved   3. Chol: continue zocor  Calcium score 26 05/28/15 f/u labs with primary   4.  ECG normal today myovue with no old MI improved    5. Leg Cramps f/u labs primary check ABI's pulses ok on exam  6. Ventral hernia reducible would not operate on until valve fixed     Disposition:   FU with me in a year     Signed, Charlton HawsPeter Jaylene Schrom, MD  01/01/2018 8:24 AM    [facility] [address], [place], [place]  [address] Phone: [phone](336) [phone]; Fax: [phone]

## 2018-01-01 ENCOUNTER — Ambulatory Visit (INDEPENDENT_AMBULATORY_CARE_PROVIDER_SITE_OTHER): Payer: BLUE CROSS/BLUE SHIELD | Admitting: Cardiovascular Disease

## 2018-01-01 ENCOUNTER — Telehealth (HOSPITAL_COMMUNITY): Payer: Self-pay | Admitting: *Deleted

## 2018-01-01 ENCOUNTER — Encounter: Payer: Self-pay | Admitting: Cardiovascular Disease

## 2018-01-01 VITALS — BP 150/78 | HR 87 | Ht 72.0 in | Wt 258.5 lb

## 2018-01-01 DIAGNOSIS — I350 Nonrheumatic aortic (valve) stenosis: Secondary | ICD-10-CM

## 2018-01-01 DIAGNOSIS — R079 Chest pain, unspecified: Secondary | ICD-10-CM

## 2018-01-01 DIAGNOSIS — M79606 Pain in leg, unspecified: Secondary | ICD-10-CM

## 2018-01-01 DIAGNOSIS — R0602 Shortness of breath: Secondary | ICD-10-CM | POA: Diagnosis not present

## 2018-01-01 NOTE — Telephone Encounter (Signed)
Patient given detailed instructions per Myocardial Perfusion Study Information Sheet for the test on 01/04/18. Patient notified to arrive 15 minutes early and that it is imperative to arrive on time for appointment to keep from having the test rescheduled.  If you need to cancel or reschedule your appointment, please call the office within 24 hours of your appointment. . Patient verbalized understanding. Daniel Mann Jacqueline    

## 2018-01-01 NOTE — Patient Instructions (Signed)
Medication Instructions:  Your physician recommends that you continue on your current medications as directed. Please refer to the Current Medication list given to you today.   Labwork: None ordered  Testing/Procedures: Your physician has requested that you have an ankle brachial index (ABI). During this test an ultrasound and blood pressure cuff are used to evaluate the arteries that supply the arms and legs with blood. Allow thirty minutes for this exam. There are no restrictions or special instructions.  Your physician has requested that you have a lexiscan myoview. For further information please visit [email or web address] Please follow instruction sheet, as given.  Your physician has requested that you have an echocardiogram in 6 months on the same day as appointment with Dr. Eden EmmsNishan. Echocardiography is a painless test that uses sound waves to create images of your heart. It provides your doctor with information about the size and shape of your heart and how well your heart's chambers and valves are working. This procedure takes approximately one hour. There are no restrictions for this procedure.  Follow-Up: Your physician wants you to follow-up in: 6 months with Dr. Haywood FillerNishan You will receive a reminder letter in the mail two months in advance. If you don't receive a letter, please call our office to schedule the follow-up appointment.   Any Other Special Instructions Will Be Listed Below (If Applicable).     If you need a refill on your cardiac medications before your next appointment, please call your pharmacy.

## 2018-01-03 ENCOUNTER — Other Ambulatory Visit: Payer: Self-pay | Admitting: Cardiovascular Disease

## 2018-01-03 DIAGNOSIS — M79606 Pain in leg, unspecified: Secondary | ICD-10-CM

## 2018-01-04 ENCOUNTER — Ambulatory Visit (HOSPITAL_COMMUNITY): Payer: BLUE CROSS/BLUE SHIELD | Attending: Cardiology

## 2018-01-04 DIAGNOSIS — I10 Essential (primary) hypertension: Secondary | ICD-10-CM | POA: Diagnosis not present

## 2018-01-04 DIAGNOSIS — R0602 Shortness of breath: Secondary | ICD-10-CM | POA: Diagnosis not present

## 2018-01-04 DIAGNOSIS — R079 Chest pain, unspecified: Secondary | ICD-10-CM | POA: Diagnosis not present

## 2018-01-04 LAB — MYOCARDIAL PERFUSION IMAGING
CHL CUP NUCLEAR SRS: 4
CHL CUP REST BP STRESS: 13976 mmHg
CHL CUP RESTING HR STRESS: 67 {beats}/min
LV dias vol: 102 mL (ref 62–150)
LV sys vol: 37 mL
NUC STRESS TID: 0.89
Peak HR: 95 {beats}/min
RATE: 0.41
SDS: 1
SSS: 5

## 2018-01-04 MED ORDER — TECHNETIUM TC 99M TETROFOSMIN IV KIT
10.5000 | PACK | Freq: Once | INTRAVENOUS | Status: AC | PRN
  Administered 2018-01-04: 10.5 via INTRAVENOUS
  Filled 2018-01-04: qty 11

## 2018-01-04 MED ORDER — TECHNETIUM TC 99M TETROFOSMIN IV KIT
30.7000 | PACK | Freq: Once | INTRAVENOUS | Status: AC | PRN
  Administered 2018-01-04: 30.7 via INTRAVENOUS
  Filled 2018-01-04: qty 31

## 2018-01-04 MED ORDER — REGADENOSON 0.4 MG/5ML IV SOLN
0.4000 mg | Freq: Once | INTRAVENOUS | Status: AC
  Administered 2018-01-04: 0.4 mg via INTRAVENOUS

## 2018-01-08 ENCOUNTER — Telehealth: Payer: Self-pay | Admitting: Cardiovascular Disease

## 2018-01-08 NOTE — Telephone Encounter (Signed)
New Message    Patient is calling in reference to his stress test. Please call to discuss.

## 2018-01-08 NOTE — Telephone Encounter (Signed)
Patient aware of myoview stress test results. Per Dr. Eden Emms, Normal myovue study with no evidence of ischemia or infarction. Patient verbalized understanding.

## 2018-01-09 ENCOUNTER — Ambulatory Visit (HOSPITAL_COMMUNITY)
Admission: RE | Admit: 2018-01-09 | Discharge: 2018-01-09 | Disposition: A | Discharge status: Home / Self Care | Payer: BLUE CROSS/BLUE SHIELD | Source: Ambulatory Visit | Attending: Internal Medicine | Admitting: Internal Medicine

## 2018-01-09 DIAGNOSIS — E785 Hyperlipidemia, unspecified: Secondary | ICD-10-CM | POA: Insufficient documentation

## 2018-01-09 DIAGNOSIS — M79606 Pain in leg, unspecified: Secondary | ICD-10-CM | POA: Diagnosis not present

## 2018-01-09 DIAGNOSIS — I10 Essential (primary) hypertension: Secondary | ICD-10-CM | POA: Insufficient documentation

## 2018-01-19 DIAGNOSIS — R7301 Impaired fasting glucose: Secondary | ICD-10-CM | POA: Diagnosis not present

## 2018-01-19 DIAGNOSIS — I350 Nonrheumatic aortic (valve) stenosis: Secondary | ICD-10-CM | POA: Diagnosis not present

## 2018-01-19 DIAGNOSIS — I10 Essential (primary) hypertension: Secondary | ICD-10-CM | POA: Diagnosis not present

## 2018-01-19 DIAGNOSIS — E782 Mixed hyperlipidemia: Secondary | ICD-10-CM | POA: Diagnosis not present

## 2018-05-01 DIAGNOSIS — R7301 Impaired fasting glucose: Secondary | ICD-10-CM | POA: Diagnosis not present

## 2018-05-01 DIAGNOSIS — E782 Mixed hyperlipidemia: Secondary | ICD-10-CM | POA: Diagnosis not present

## 2018-05-01 DIAGNOSIS — I350 Nonrheumatic aortic (valve) stenosis: Secondary | ICD-10-CM | POA: Diagnosis not present

## 2018-05-01 DIAGNOSIS — I10 Essential (primary) hypertension: Secondary | ICD-10-CM | POA: Diagnosis not present

## 2018-05-03 DIAGNOSIS — D485 Neoplasm of uncertain behavior of skin: Secondary | ICD-10-CM | POA: Diagnosis not present

## 2018-05-03 DIAGNOSIS — L821 Other seborrheic keratosis: Secondary | ICD-10-CM | POA: Diagnosis not present

## 2018-05-03 DIAGNOSIS — L578 Other skin changes due to chronic exposure to nonionizing radiation: Secondary | ICD-10-CM | POA: Diagnosis not present

## 2018-05-03 DIAGNOSIS — D225 Melanocytic nevi of trunk: Secondary | ICD-10-CM | POA: Diagnosis not present

## 2018-06-19 ENCOUNTER — Other Ambulatory Visit: Payer: Self-pay

## 2018-06-19 ENCOUNTER — Ambulatory Visit (HOSPITAL_COMMUNITY): Payer: BLUE CROSS/BLUE SHIELD | Attending: Cardiology

## 2018-06-19 DIAGNOSIS — I350 Nonrheumatic aortic (valve) stenosis: Secondary | ICD-10-CM

## 2018-06-25 NOTE — Progress Notes (Signed)
Patient ID: Daniel Mann, male   DOB: 1951-07-19, 67 y.o.   MRN: [id number]     Cardiology Office Note   Date:  06/28/2018   ID:  Daniel Mann, DOB 11-23-1950, MRN [id number]  PCP:  Blane Ohara, MD  Cardiologist:   Charlton Haws, MD   No chief complaint on file.     History of Present Illness: 67 y.o. with moderate to severe AS. Echo 12/19/17 mean gradient 38 mmHg peak 85 mmHg Has been asymptomatic CRF;s HTN, HLD. Intolerant to statins LDL 138.  Myovue normal 2016 Poor diet and sedentary works at [employer] over 41 years Wife with stage 4 lung cancer and disabled child with Duchenne muscular dystrophy   Discussed timing of valve replacement he has mild exertional dyspnea no chest pain or syncope Still working at [employer] 7 days / week Some leg cramps while walking Has ventral hernia   Myovue done 01/04/18 normal EF 64%   TTE done 06/19/18 updated  Peak velocity 5cm/sec mean gradient 41 mm Hg peak gradient 103 mmHg and DVI .65   Long discussion with him and son regarding need for AV surgery. Also discussed minimally invasive, vs sternotomy vs TAVR.  He is very vital for his age and I think he should be considered for a mechanical AVR as opposed to bioprosthetic either via TAVR or surgery.  Arranged cath with Dr Katrinka Blazing on Monday Risks of procedure including stroke , MI , need for emergency surgery and bleeding discussed willing to proceed   Past Medical History:  Diagnosis Date  . GERD (gastroesophageal reflux disease)   . Heart murmur   . Hernia, inguinal, right   . Hyperlipidemia   . Hypertension   . Impaired fasting glucose   . Mixed hyperlipidemia   . Vitamin D deficiency     Past Surgical History:  Procedure Laterality Date  . COLONOSCOPY       Current Outpatient Medications  Medication Sig Dispense Refill  . amLODipine (NORVASC) 10 MG tablet Take 10 mg by mouth daily.    . ciprofloxacin (CIPRO) 500 MG tablet Take 500 mg by mouth as directed. prostitis    . co-enzyme  Q-10 30 MG capsule Take 30 mg by mouth 3 (three) times daily.    . ergocalciferol (VITAMIN D2) 50000 UNITS capsule Take 50,000 Units by mouth once a week.    . esomeprazole (NEXIUM) 40 MG capsule Take 40 mg by mouth daily at 12 noon.    Marland Kitchen lisinopril (PRINIVIL,ZESTRIL) 40 MG tablet Take 40 mg by mouth 2 (two) times daily.    . Pitavastatin Calcium (LIVALO) 1 MG TABS Take 1 mg by mouth as directed.     No current facility-administered medications for this visit.     Allergies:   Patient has no known allergies.    Social History:  The patient  reports that he has quit smoking. He has never used smokeless tobacco. He reports that he drinks alcohol. He reports that he does not use drugs.   Family History:  The patient's family history includes Alzheimer's disease in his mother; Cancer - Prostate in his father; Renal Disease in his father.    ROS:  Please see the history of present illness.   Otherwise, review of systems are positive for none.   All other systems are reviewed and negative.    PHYSICAL EXAM: VS:  BP 116/74   Pulse 97   Ht 6' (1.829 m)   Wt 225 lb (102.1 kg)  SpO2 97%   BMI 30.52 kg/m  , BMI Body mass index is 30.52 kg/m. Affect appropriate Healthy:  appears stated age HEENT: normal Neck supple with no adenopathy JVP normal no bruits no thyromegaly Lungs clear with no wheezing and good diaphragmatic motion Heart:  S1/S2 muffled late peaking AS murmur, no rub, gallop or click PMI normal Abdomen: benighn, BS positve, no tenderness, no AAA no bruit.  No HSM or HJR ventral hernia  Distal pulses intact with no bruits No edema Neuro non-focal Skin warm and dry No muscular weakness     EKG:   NSR possible old IMI      06/10/16  SR rate 76 normal ECG  06/01/17 SR rate 76 normal  01/01/18 NSR rate 73 normal   Recent Labs: No results found for requested labs within last 8760 hours.    Lipid Panel    Component Value Date/Time   CHOL 180 08/31/2015 0814   TRIG 80  08/31/2015 0814   HDL 51 08/31/2015 0814   CHOLHDL 3.5 08/31/2015 0814   VLDL 16 08/31/2015 0814   LDLCALC 113 08/31/2015 0814      Wt Readings from Last 3 Encounters:  06/28/18 225 lb (102.1 kg)  01/04/18 258 lb (117 kg)  01/01/18 258 lb 8 oz (117.3 kg)      Other studies Reviewed: Additional studies/ records that were reviewed today include: [facility] Dr Sedalia Muta.    ASSESSMENT AND PLAN:  1.  Aortic Stenosis : worsening gradients in last 6 months with peak 103 mmHg mean is over 40 mmHg now an DVI .27 Discussed Need for right and left cath and referral for surgical consideration Cath arranged for Monday with Dr Katrinka Blazing labs done orders written and referral to Dr Laneta Simmers made   2. HTN:  F/u Dr Sedalia Muta encouraged compliance and lifestyle modifications as well as increased exercise continue ACe and calcium blocker improved   3. Chol: continue zocor  Calcium score 26 05/28/15 f/u labs with primary   4.  ECG normal today myovue with no old MI improved   5. Leg Cramps f/u labs primary check ABI's pulses ok on exam  6. Ventral hernia reducible would not operate on until valve fixed     Disposition:   FU with me post cath     Signed, Charlton Haws, MD  06/28/2018 9:58 AM    [facility] [address] [place], [place], [place]  [address] Phone: [phone]; Fax: [phone]

## 2018-06-28 ENCOUNTER — Other Ambulatory Visit (HOSPITAL_COMMUNITY): Payer: Self-pay | Admitting: Cardiovascular Disease

## 2018-06-28 ENCOUNTER — Ambulatory Visit (INDEPENDENT_AMBULATORY_CARE_PROVIDER_SITE_OTHER): Payer: BLUE CROSS/BLUE SHIELD | Admitting: Cardiovascular Disease

## 2018-06-28 ENCOUNTER — Encounter: Payer: Self-pay | Admitting: Cardiovascular Disease

## 2018-06-28 VITALS — BP 116/74 | HR 97 | Ht 72.0 in | Wt 225.0 lb

## 2018-06-28 DIAGNOSIS — I350 Nonrheumatic aortic (valve) stenosis: Secondary | ICD-10-CM | POA: Diagnosis not present

## 2018-06-28 DIAGNOSIS — Z01812 Encounter for preprocedural laboratory examination: Secondary | ICD-10-CM | POA: Diagnosis not present

## 2018-06-28 LAB — CBC WITH DIFFERENTIAL/PLATELET
BASOS: 0 %
Basophils Absolute: 0 10*3/uL (ref 0.0–0.2)
EOS (ABSOLUTE): 0.1 10*3/uL (ref 0.0–0.4)
Eos: 1 %
Hematocrit: 45.5 % (ref 37.5–51.0)
Hemoglobin: 15.7 g/dL (ref 13.0–17.7)
Immature Grans (Abs): 0 10*3/uL (ref 0.0–0.1)
Immature Granulocytes: 0 %
LYMPHS ABS: 1.8 10*3/uL (ref 0.7–3.1)
Lymphs: 23 %
MCH: 31.4 pg (ref 26.6–33.0)
MCHC: 34.5 g/dL (ref 31.5–35.7)
MCV: 91 fL (ref 79–97)
MONOS ABS: 0.9 10*3/uL (ref 0.1–0.9)
Monocytes: 11 %
NEUTROS PCT: 65 %
Neutrophils Absolute: 5.2 10*3/uL (ref 1.4–7.0)
PLATELETS: 298 10*3/uL (ref 150–450)
RBC: 5 x10E6/uL (ref 4.14–5.80)
RDW: 12.5 % (ref 12.3–15.4)
WBC: 8 10*3/uL (ref 3.4–10.8)

## 2018-06-28 LAB — BASIC METABOLIC PANEL
BUN / CREAT RATIO: 17 (ref 10–24)
BUN: 14 mg/dL (ref 8–27)
CO2: 24 mmol/L (ref 20–29)
Calcium: 10.6 mg/dL — ABNORMAL HIGH (ref 8.6–10.2)
Chloride: 99 mmol/L (ref 96–106)
Creatinine, Ser: 0.81 mg/dL (ref 0.76–1.27)
GFR calc Af Amer: 106 mL/min/{1.73_m2} (ref >59)
GFR calc non Af Amer: 91 mL/min/{1.73_m2} (ref >59)
GLUCOSE: 78 mg/dL (ref 65–99)
POTASSIUM: 4.6 mmol/L (ref 3.5–5.2)
SODIUM: 138 mmol/L (ref 134–144)

## 2018-06-28 NOTE — Patient Instructions (Addendum)
Medication Instructions:  Your physician recommends that you continue on your current medications as directed. Please refer to the Current Medication list given to you today.  Labwork: Your physician recommends that you have lab work today- BMET and CBC  Testing/Procedures: Your physician has requested that you have a cardiac catheterization. Cardiac catheterization is used to diagnose and/or treat various heart conditions. Doctors may recommend this procedure for a number of different reasons. The most common reason is to evaluate chest pain. Chest pain can be a symptom of coronary artery disease (CAD), and cardiac catheterization can show whether plaque is narrowing or blocking your heart's arteries. This procedure is also used to evaluate the valves, as well as measure the blood flow and oxygen levels in different parts of your heart. For further information please visit [email or web address] Please follow instruction sheet, as given.  Follow-Up: You have been referred to Dr. Laneta Simmers for Aortic Stenosis  Your physician recommends that you keep your follow up appointment- Dr. Eden Emms.   If you need a refill on your cardiac medications before your next appointment, please call your pharmacy.

## 2018-07-01 DIAGNOSIS — I350 Nonrheumatic aortic (valve) stenosis: Secondary | ICD-10-CM

## 2018-07-01 HISTORY — DX: Nonrheumatic aortic (valve) stenosis: I35.0

## 2018-07-02 ENCOUNTER — Encounter (HOSPITAL_COMMUNITY): Payer: Self-pay | Admitting: Interventional Cardiology

## 2018-07-02 ENCOUNTER — Encounter (HOSPITAL_COMMUNITY)
Admission: RE | Disposition: A | Discharge status: Home / Self Care | Payer: Self-pay | Source: Ambulatory Visit | Attending: Interventional Cardiology

## 2018-07-02 ENCOUNTER — Ambulatory Visit (HOSPITAL_COMMUNITY)
Admission: RE | Admit: 2018-07-02 | Discharge: 2018-07-02 | Disposition: A | Discharge status: Home / Self Care | Payer: BLUE CROSS/BLUE SHIELD | Source: Ambulatory Visit | Attending: Interventional Cardiology | Admitting: Interventional Cardiology

## 2018-07-02 DIAGNOSIS — N189 Chronic kidney disease, unspecified: Secondary | ICD-10-CM | POA: Insufficient documentation

## 2018-07-02 DIAGNOSIS — K219 Gastro-esophageal reflux disease without esophagitis: Secondary | ICD-10-CM | POA: Insufficient documentation

## 2018-07-02 DIAGNOSIS — I25118 Atherosclerotic heart disease of native coronary artery with other forms of angina pectoris: Secondary | ICD-10-CM | POA: Diagnosis not present

## 2018-07-02 DIAGNOSIS — I25110 Atherosclerotic heart disease of native coronary artery with unstable angina pectoris: Secondary | ICD-10-CM | POA: Diagnosis not present

## 2018-07-02 DIAGNOSIS — I2582 Chronic total occlusion of coronary artery: Secondary | ICD-10-CM | POA: Insufficient documentation

## 2018-07-02 DIAGNOSIS — E782 Mixed hyperlipidemia: Secondary | ICD-10-CM | POA: Diagnosis present

## 2018-07-02 DIAGNOSIS — I350 Nonrheumatic aortic (valve) stenosis: Secondary | ICD-10-CM | POA: Diagnosis not present

## 2018-07-02 DIAGNOSIS — I10 Essential (primary) hypertension: Secondary | ICD-10-CM | POA: Diagnosis present

## 2018-07-02 DIAGNOSIS — I129 Hypertensive chronic kidney disease with stage 1 through stage 4 chronic kidney disease, or unspecified chronic kidney disease: Secondary | ICD-10-CM | POA: Diagnosis not present

## 2018-07-02 DIAGNOSIS — I2584 Coronary atherosclerosis due to calcified coronary lesion: Secondary | ICD-10-CM | POA: Diagnosis not present

## 2018-07-02 HISTORY — PX: RIGHT/LEFT HEART CATH AND CORONARY ANGIOGRAPHY: CATH118266

## 2018-07-02 LAB — POCT I-STAT 3, ART BLOOD GAS (G3+)
Bicarbonate: 25.3 mmol/L (ref 20.0–28.0)
O2 SAT: 98 %
PH ART: 7.377 (ref 7.350–7.450)
TCO2: 27 mmol/L (ref 22–32)
pCO2 arterial: 43.1 mmHg (ref 32.0–48.0)
pO2, Arterial: 110 mmHg — ABNORMAL HIGH (ref 83.0–108.0)

## 2018-07-02 LAB — POCT I-STAT 3, VENOUS BLOOD GAS (G3P V)
BICARBONATE: 25.4 mmol/L (ref 20.0–28.0)
O2 SAT: 75 %
PCO2 VEN: 44.4 mmHg (ref 44.0–60.0)
PO2 VEN: 42 mmHg (ref 32.0–45.0)
TCO2: 27 mmol/L (ref 22–32)
pH, Ven: 7.366 (ref 7.250–7.430)

## 2018-07-02 SURGERY — RIGHT/LEFT HEART CATH AND CORONARY ANGIOGRAPHY
Anesthesia: LOCAL

## 2018-07-02 MED ORDER — SODIUM CHLORIDE 0.9 % IV SOLN: 250.0000 mL | INTRAVENOUS | Status: DC | PRN

## 2018-07-02 MED ORDER — SODIUM CHLORIDE 0.9 % WEIGHT BASED INFUSION: 1.0000 mL/kg/h | INTRAVENOUS | Status: DC

## 2018-07-02 MED ORDER — SODIUM CHLORIDE 0.9 % IV SOLN: INTRAVENOUS | Status: DC

## 2018-07-02 MED ORDER — OXYCODONE HCL 5 MG PO TABS: 5.0000 mg | ORAL_TABLET | ORAL | Status: DC | PRN

## 2018-07-02 MED ORDER — SODIUM CHLORIDE 0.9% FLUSH: 3.0000 mL | INTRAVENOUS | Status: DC | PRN

## 2018-07-02 MED ORDER — HEPARIN (PORCINE) IN NACL 1000-0.9 UT/500ML-% IV SOLN
INTRAVENOUS | Status: AC
  Filled 2018-07-02: qty 500

## 2018-07-02 MED ORDER — MIDAZOLAM HCL 2 MG/2ML IJ SOLN
INTRAMUSCULAR | Status: AC
  Filled 2018-07-02: qty 2

## 2018-07-02 MED ORDER — SODIUM CHLORIDE 0.9% FLUSH: 3.0000 mL | Freq: Two times a day (BID) | INTRAVENOUS | Status: DC

## 2018-07-02 MED ORDER — VERAPAMIL HCL 2.5 MG/ML IV SOLN
INTRAVENOUS | Status: AC
  Filled 2018-07-02: qty 2

## 2018-07-02 MED ORDER — ASPIRIN 81 MG PO CHEW: 81.0000 mg | CHEWABLE_TABLET | Freq: Every day | ORAL | Status: DC

## 2018-07-02 MED ORDER — SODIUM CHLORIDE 0.9 % WEIGHT BASED INFUSION
3.0000 mL/kg/h | INTRAVENOUS | Status: AC
  Administered 2018-07-02: 3 mL/kg/h via INTRAVENOUS

## 2018-07-02 MED ORDER — VERAPAMIL HCL 2.5 MG/ML IV SOLN
INTRAVENOUS | Status: DC | PRN
  Administered 2018-07-02: 10 mL via INTRA_ARTERIAL

## 2018-07-02 MED ORDER — HEPARIN (PORCINE) IN NACL 1000-0.9 UT/500ML-% IV SOLN
INTRAVENOUS | Status: DC | PRN
  Administered 2018-07-02 (×2): 500 mL

## 2018-07-02 MED ORDER — IOHEXOL 350 MG/ML SOLN
INTRAVENOUS | Status: DC | PRN
  Administered 2018-07-02: 75 mL via INTRA_ARTERIAL

## 2018-07-02 MED ORDER — ACETAMINOPHEN 325 MG PO TABS: 650.0000 mg | ORAL_TABLET | ORAL | Status: DC | PRN

## 2018-07-02 MED ORDER — FENTANYL CITRATE (PF) 100 MCG/2ML IJ SOLN
INTRAMUSCULAR | Status: AC
  Filled 2018-07-02: qty 2

## 2018-07-02 MED ORDER — ASPIRIN 81 MG PO CHEW: 81.0000 mg | CHEWABLE_TABLET | ORAL | Status: DC

## 2018-07-02 MED ORDER — MIDAZOLAM HCL 2 MG/2ML IJ SOLN
INTRAMUSCULAR | Status: DC | PRN
  Administered 2018-07-02 (×2): 0.5 mg via INTRAVENOUS

## 2018-07-02 MED ORDER — HEPARIN SODIUM (PORCINE) 1000 UNIT/ML IJ SOLN
INTRAMUSCULAR | Status: DC | PRN
  Administered 2018-07-02: 5000 [IU] via INTRAVENOUS

## 2018-07-02 MED ORDER — LIDOCAINE HCL (PF) 1 % IJ SOLN
INTRAMUSCULAR | Status: AC
  Filled 2018-07-02: qty 30

## 2018-07-02 MED ORDER — LIDOCAINE HCL (PF) 1 % IJ SOLN
INTRAMUSCULAR | Status: DC | PRN
  Administered 2018-07-02 (×2): 2 mL

## 2018-07-02 MED ORDER — FENTANYL CITRATE (PF) 100 MCG/2ML IJ SOLN
INTRAMUSCULAR | Status: DC | PRN
  Administered 2018-07-02 (×2): 25 ug via INTRAVENOUS

## 2018-07-02 MED ORDER — ONDANSETRON HCL 4 MG/2ML IJ SOLN: 4.0000 mg | Freq: Four times a day (QID) | INTRAMUSCULAR | Status: DC | PRN

## 2018-07-02 SURGICAL SUPPLY — 12 items
CATH 5FR JL3.5 JR4 ANG PIG MP (CATHETERS) ×1 IMPLANT
CATH BALLN WEDGE 5F 110CM (CATHETERS) ×2 IMPLANT
DEVICE RAD COMP TR BAND LRG (VASCULAR PRODUCTS) ×1 IMPLANT
GLIDESHEATH SLEND A-KIT 6F 22G (SHEATH) ×2 IMPLANT
GUIDEWIRE INQWIRE 1.5J.035X260 (WIRE) ×1 IMPLANT
INQWIRE 1.5J .035X260CM (WIRE) ×2
KIT HEART LEFT (KITS) ×2 IMPLANT
PACK CARDIAC CATHETERIZATION (CUSTOM PROCEDURE TRAY) ×2 IMPLANT
SHEATH GLIDE SLENDER 4/5FR (SHEATH) ×1 IMPLANT
SHEATH PROBE COVER 6X72 (BAG) ×2 IMPLANT
TRANSDUCER W/STOPCOCK (MISCELLANEOUS) ×2 IMPLANT
TUBING CIL FLEX 10 FLL-RA (TUBING) ×2 IMPLANT

## 2018-07-02 NOTE — Discharge Instructions (Signed)

## 2018-07-03 NOTE — H&P (Signed)
See office note from Dr. Eden Emms. Exam is unchanged. Cath being done to exclude concomitant CAD in setting of severe AS.

## 2018-07-04 ENCOUNTER — Other Ambulatory Visit: Payer: Self-pay | Admitting: *Deleted

## 2018-07-04 ENCOUNTER — Institutional Professional Consult (permissible substitution) (INDEPENDENT_AMBULATORY_CARE_PROVIDER_SITE_OTHER): Payer: BLUE CROSS/BLUE SHIELD | Admitting: Surgery

## 2018-07-04 ENCOUNTER — Other Ambulatory Visit: Payer: Self-pay

## 2018-07-04 ENCOUNTER — Encounter: Payer: Self-pay | Admitting: Surgery

## 2018-07-04 ENCOUNTER — Encounter: Payer: Self-pay | Admitting: *Deleted

## 2018-07-04 VITALS — BP 131/80 | HR 81 | Resp 16 | Ht 72.0 in | Wt 225.0 lb

## 2018-07-04 DIAGNOSIS — I350 Nonrheumatic aortic (valve) stenosis: Secondary | ICD-10-CM

## 2018-07-04 DIAGNOSIS — I25118 Atherosclerotic heart disease of native coronary artery with other forms of angina pectoris: Secondary | ICD-10-CM | POA: Diagnosis not present

## 2018-07-04 DIAGNOSIS — I2510 Atherosclerotic heart disease of native coronary artery without angina pectoris: Secondary | ICD-10-CM

## 2018-07-04 NOTE — Progress Notes (Signed)
Cardiothoracic Surgery Consultation   PCP is Cox, Fritzi Mandes, MD Referring Provider is Wendall Stade, MD  Chief Complaint  Patient presents with  . Aortic Stenosis    eval for AVR...echo 10/8/cath 07/02/18  . Coronary Artery Disease    HPI:  The patient is a 67 year old gentleman with history of hypertension, hyperlipidemia, intolerance to statins, and aortic stenosis who has been followed by Dr. Eden Emms.  He had a 2D echocardiogram in April 2019 which showed a mean gradient of 38 mmHg and a peak gradient of 85 mmHg with a dimensionless index of 0.31.  He had a nuclear stress test at that time there was a low risk study with ejection fraction of 64% and no significant reversible ischemia.  The patient reports that over this past several months he has had some chest discomfort and shortness of breath with exertion such as walking briskly, walking up hills, or using the treadmill at the gym.  He has had no symptoms with normal daily activity.  He has had occasional mild dizziness but no syncope.  He denies orthopnea and PND.  He said no peripheral edema.  He had a follow-up echocardiogram on 06/19/2018 which showed a mean gradient of 41 mmHg with a peak of 103 mmHg.  The dimensionless index was 0.25.  Left ventricular ejection fraction was 60 to 65%.  Cardiac catheterization was performed on 07/02/2018.  This confirmed severe aortic stenosis with a mean gradient of 33 mmHg and a peak to peak gradient of 51 mmHg.  Coronary angiography showed severe three-vessel disease with total occlusion of the proximal codominant right coronary artery with collaterals filling the distal vessel.  The distal left circumflex had a large marginal with 50 to 70% narrowing.  The LAD had 75% proximal stenosis.  The patient is here today with his son who is a Engineer, civil (consulting) on 4 E.  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  Past Medical History:  Diagnosis Date  . GERD (gastroesophageal reflux disease)     . Heart murmur   . Hernia, inguinal, right   . Hyperlipidemia   . Hypertension   . Impaired fasting glucose   . Mixed hyperlipidemia   . Vitamin D deficiency     Past Surgical History:  Procedure Laterality Date  . COLONOSCOPY    . RIGHT/LEFT HEART CATH AND CORONARY ANGIOGRAPHY N/A 07/02/2018   Procedure: RIGHT/LEFT HEART CATH AND CORONARY ANGIOGRAPHY;  Surgeon: Lyn Records, MD;  Location: [facility];  Service: Cardiovascular;  Laterality: N/A;    Family History  Problem Relation Age of Onset  . Alzheimer's disease Mother   . Renal Disease Father   . Cancer - Prostate Father     Social History Social History   Tobacco Use  . Smoking status: Former Games developer  . Smokeless tobacco: Never Used  Substance Use Topics  . Alcohol use: Yes    Comment: WINE   . Drug use: No    Current Outpatient Medications  Medication Sig Dispense Refill  . amLODipine (NORVASC) 10 MG tablet Take 10 mg by mouth daily.    Marland Kitchen aspirin 81 MG EC tablet Take 81 mg by mouth daily.    . ciprofloxacin (CIPRO) 500 MG tablet Take 500 mg by mouth 2 (two) times daily as needed (prostitis).     Marland Kitchen co-enzyme Q-10 30 MG capsule Take 30 mg by mouth daily.     . ergocalciferol (VITAMIN D2) 50000 UNITS capsule  Take 50,000 Units by mouth 2 (two) times a week.     . esomeprazole (NEXIUM) 20 MG packet Take 20 mg by mouth daily at 12 noon.     Marland Kitchen lisinopril (PRINIVIL,ZESTRIL) 40 MG tablet Take 40 mg by mouth daily.     . Pitavastatin Calcium (LIVALO) 1 MG TABS Take 1 mg by mouth daily.      No current facility-administered medications for this visit.     No Known Allergies  Review of Systems  Constitutional: Positive for activity change. Negative for fatigue.  HENT: Negative.        Sees dentist regularly   Eyes: Negative.   Respiratory: Positive for shortness of breath.   Cardiovascular: Positive for chest pain. Negative for palpitations and leg swelling.  Gastrointestinal: Negative.   Endocrine:  Negative.   Genitourinary: Positive for frequency.       BPH, prostatitis  Musculoskeletal: Negative.   Allergic/Immunologic: Negative.   Neurological: Positive for dizziness. Negative for syncope.  Hematological: Negative.   Psychiatric/Behavioral: Negative.     BP 131/80 (BP Location: Right Arm, Patient Position: Sitting, Cuff Size: Large)   Pulse 81   Resp 16   Ht 6' (1.829 m)   Wt 225 lb (102.1 kg)   SpO2 97% Comment: ON RA  BMI 30.52 kg/m  Physical Exam  Constitutional: He is oriented to person, place, and time. He appears well-developed and well-nourished. No distress.  HENT:  Head: Normocephalic and atraumatic.  Mouth/Throat: Oropharynx is clear and moist.  Eyes: Pupils are equal, round, and reactive to light. Conjunctivae and EOM are normal.  Neck: Normal range of motion. Neck supple. No JVD present. No thyromegaly present.  Cardiovascular: Normal rate, regular rhythm and intact distal pulses.  Murmur heard. 3/6 systolic murmur RSB  Pulmonary/Chest: Effort normal and breath sounds normal. No respiratory distress.  Abdominal: Soft. Bowel sounds are normal. He exhibits no distension. There is no tenderness.  Large umbilical hernia that is easily reducible. Some discoloration of skin over hernia sac.  Musculoskeletal: Normal range of motion. He exhibits no edema.  Lymphadenopathy:    He has no cervical adenopathy.  Neurological: He is alert and oriented to person, place, and time. He has normal strength. No cranial nerve deficit or sensory deficit.  Skin: Skin is warm and dry.  Psychiatric: He has a normal mood and affect.     Diagnostic Tests:  Result status: Final result                           [facility]                        [address]                        [place], [place] [address]                            [phone]  ------------------------------------------------------------------- Transthoracic Echocardiography  Patient:     Taseen, Marasigan MR #:       [id number] Study Date: 06/19/2018 Gender:     M Age:        37 Height:     182.9 cm Weight:     117 kg BSA:        2.48 m^2 Pt. Status: Room:   ORDERING  Charlton Haws, M.D.  REFERRING    Charlton Haws, M.D.  ATTENDING    Armanda Magic, MD  SONOGRAPHER  Randa Evens, Will  PERFORMING   [facility], Outpatient  cc:  ------------------------------------------------------------------- LV EF: 60% -   65%  ------------------------------------------------------------------- Indications:      (I35.0).  ------------------------------------------------------------------- History:   PMH:  Acquired from the patient and from the patient&'s chart.  Chest pain.  Dyspnea.  Aortic stenosis.  Risk factors: Hypertension. Dyslipidemia.  ------------------------------------------------------------------- Study Conclusions  - Left ventricle: The cavity size was normal. There was severe   focal basal hypertrophy. Systolic function was normal. The   estimated ejection fraction was in the range of 60% to 65%. Wall   motion was normal; there were no regional wall motion   abnormalities. Left ventricular diastolic function parameters   were normal. - Aortic valve: Trileaflet; severely thickened, severely calcified   leaflets. Valve mobility was restricted. There was severe   stenosis. Mean gradient (S): 41 mm Hg. VTI ratio of LVOT to   aortic valve: 0.27. - Mitral valve: There was trivial regurgitation. - Left atrium: The atrium was mildly dilated. - Pulmonic valve: There was trivial regurgitation.  ------------------------------------------------------------------- Study data:  Comparison was made to the study of 12/19/2017.  Study status:  Routine.  Procedure:  The patient reported no pain pre or post test. Transthoracic echocardiography for left ventricular function evaluation. Image quality was adequate.  Study completion:  There were no complications.           Transthoracic echocardiography.  M-mode, complete 2D, spectral Doppler, and color Doppler.  Birthdate:  Patient birthdate: 07-Apr-1951.  Age:  Patient is 67 yr old.  Sex:  Gender: male.    BMI: 35 kg/m^2.  Blood pressure:     150/78  Patient status:  Outpatient.  Study date: Study date: 06/19/2018. Study time: 01:12 PM.  Location:  [facility]  -------------------------------------------------------------------  ------------------------------------------------------------------- Left ventricle:  The cavity size was normal. There was severe focal basal hypertrophy. Systolic function was normal. The estimated ejection fraction was in the range of 60% to 65%. Wall motion was normal; there were no regional wall motion abnormalities. The transmitral flow pattern was normal. The deceleration time of the early transmitral flow velocity was normal. The pulmonary vein flow pattern was normal. The tissue Doppler parameters were normal. Left ventricular diastolic function parameters were normal.  ------------------------------------------------------------------- Aortic valve:   Trileaflet; severely thickened, severely calcified leaflets. Valve mobility was restricted.  Doppler:   There was severe stenosis.   There was no regurgitation.    VTI ratio of LVOT to aortic valve: 0.27. Valve area (VTI): 0.85 cm^2. Indexed valve area (VTI): 0.34 cm^2/m^2. Peak velocity ratio of LVOT to aortic valve: 0.25. Valve area (Vmax): 0.8 cm^2. Indexed valve area (Vmax): 0.32 cm^2/m^2. Mean velocity ratio of LVOT to aortic valve: 0.28. Valve area (Vmean): 0.88 cm^2. Indexed valve area (Vmean): 0.35 cm^2/m^2.    Mean gradient (S): 41 mm Hg. Peak gradient (S): 103 mm Hg.  ------------------------------------------------------------------- Aorta:  Aortic root: The aortic root was normal in size.  ------------------------------------------------------------------- Mitral valve:   Structurally normal  valve.   Mobility was not restricted.  Doppler:  Transvalvular velocity was within the normal range. There was no evidence for stenosis. There was trivial regurgitation.    Valve area by pressure half-time: 3.28 cm^2. Indexed valve area by pressure half-time: 1.32 cm^2/m^2.    Peak gradient (D): 4 mm Hg.  ------------------------------------------------------------------- Left atrium:  The atrium was mildly  dilated.  ------------------------------------------------------------------- Right ventricle:  The cavity size was normal. Wall thickness was normal. Systolic function was normal.  ------------------------------------------------------------------- Pulmonic valve:    Structurally normal valve.   Cusp separation was normal.  Doppler:  Transvalvular velocity was within the normal range. There was no evidence for stenosis. There was trivial regurgitation.  ------------------------------------------------------------------- Tricuspid valve:   Structurally normal valve.    Doppler: Transvalvular velocity was within the normal range. There was mild regurgitation.  ------------------------------------------------------------------- Pulmonary artery:   The main pulmonary artery was normal-sized. Systolic pressure was within the normal range.  ------------------------------------------------------------------- Right atrium:  The atrium was normal in size.  ------------------------------------------------------------------- Pericardium:  There was no pericardial effusion.  ------------------------------------------------------------------- Systemic veins: Inferior vena cava: The vessel was normal in size.  ------------------------------------------------------------------- Measurements   Left ventricle                           Value          Reference  LV ID, ED, PLAX chordal                  45    mm       43 - 52  LV ID, ES, PLAX chordal                  28    mm        23 - 38  LV fx shortening, PLAX chordal           38    %        >=29  LV PW thickness, ED                      11    mm       ----------  IVS/LV PW ratio, ED              (H)     1.45           <=1.3  Stroke volume, 2D                        84    ml       ----------  Stroke volume/bsa, 2D                    34    ml/m^2   ----------  LV e&', lateral                           8.92  cm/s     ----------  LV E/e&', lateral                         10.98          ----------  LV e&', medial                            8.05  cm/s     ----------  LV E/e&', medial                          12.16          ----------  LV e&', average  8.49  cm/s     ----------  LV E/e&', average                         11.54          ----------    Ventricular septum                       Value          Reference  IVS thickness, ED                        16    mm       ----------    LVOT                                     Value          Reference  LVOT ID, S                               20    mm       ----------  LVOT area                                3.14  cm^2     ----------  LVOT ID                                  20    mm       ----------  LVOT peak velocity, S                    129   cm/s     ----------  LVOT mean velocity, S                    81.1  cm/s     ----------  LVOT VTI, S                              26.7  cm       ----------  LVOT peak gradient, S                    7     mm Hg    ----------  Stroke volume (SV), LVOT DP              83.9  ml       ----------  Stroke index (SV/bsa), LVOT DP           33.9  ml/m^2   ----------    Aortic valve                             Value          Reference  Aortic valve peak velocity, S            507   cm/s     ----------  Aortic valve mean velocity, S            291   cm/s     ----------  Aortic valve VTI, S                      99.1  cm       ----------  Aortic mean gradient, S                  41    mm Hg    ----------   Aortic peak gradient, S                  103   mm Hg    ----------  VTI ratio, LVOT/AV                       0.27           ----------  Aortic valve area, VTI                   0.85  cm^2     ----------  Aortic valve area/bsa, VTI               0.34  cm^2/m^2 ----------  Velocity ratio, peak, LVOT/AV            0.25           ----------  Aortic valve area, peak velocity         0.8   cm^2     ----------  Aortic valve area/bsa, peak              0.32  cm^2/m^2 ----------  velocity  Velocity ratio, mean, LVOT/AV            0.28           ----------  Aortic valve area, mean velocity         0.88  cm^2     ----------  Aortic valve area/bsa, mean              0.35  cm^2/m^2 ----------  velocity    Aorta                                    Value          Reference  Aortic root ID, ED                       35    mm       ----------  Ascending aorta ID, A-P, S               35    mm       ----------    Left atrium                              Value          Reference  LA ID, A-P, ES                           45    mm       ----------  LA ID/bsa, A-P                           1.82  cm/m^2   <=2.2  LA volume, S  77.4  ml       ----------  LA volume/bsa, S                         31.2  ml/m^2   ----------  LA volume, ES, 1-p A4C                   75.6  ml       ----------  LA volume/bsa, ES, 1-p A4C               30.5  ml/m^2   ----------  LA volume, ES, 1-p A2C                   65.8  ml       ----------  LA volume/bsa, ES, 1-p A2C               26.6  ml/m^2   ----------    Mitral valve                             Value          Reference  Mitral E-wave peak velocity              97.9  cm/s     ----------  Mitral A-wave peak velocity              72.2  cm/s     ----------  Mitral deceleration time                 230   ms       150 - 230  Mitral pressure half-time                67    ms       ----------  Mitral peak gradient, D                  4     mm Hg     ----------  Mitral E/A ratio, peak                   1.4            ----------  Mitral valve area, PHT, DP               3.28  cm^2     ----------  Mitral valve area/bsa, PHT, DP           1.32  cm^2/m^2 ----------    Pulmonary arteries                       Value          Reference  PA pressure, S, DP                       23    mm Hg    <=30    Tricuspid valve                          Value          Reference  Tricuspid regurg peak velocity           221   cm/s     ----------  Tricuspid peak RV-RA gradient            20  mm Hg    ----------    Right atrium                             Value          Reference  RA ID, S-I, ES, A4C                      44    mm       34 - 49  RA area, ES, A4C                         13.3  cm^2     8.3 - 19.5  RA volume, ES, A/L                       32.1  ml       ----------  RA volume/bsa, ES, A/L                   13    ml/m^2   ----------    Systemic veins                           Value          Reference  Estimated CVP                            3     mm Hg    ----------    Right ventricle                          Value          Reference  TAPSE                                    22.6  mm       ----------  RV pressure, S, DP                       23    mm Hg    <=30  RV s&', lateral, S                        14.9  cm/s     ----------  Legend: (L)  and  (H)  mark values outside specified reference range.  ------------------------------------------------------------------- Prepared and Electronically Authenticated by  Armanda Magic, MD 2019-10-08T14:39:35   Physicians   Panel Physicians Referring Physician Case Authorizing Physician  Lyn Records, MD (Primary)    Procedures   RIGHT/LEFT HEART CATH AND CORONARY ANGIOGRAPHY  Conclusion    Severe aortic stenosis, peak to peak gradient 51 mmHg, mean gradient 33 mmHg, calculated aortic valve area 1.07 cm using Fick cardiac output of 6.06 L/min  Significant coronary artery disease  with total occlusion of proximal codominant RCA and significant segmental LAD stenosis.  The distal circumflex before a large marginal contains eccentric 50 to 70% narrowing  Normal left main coronary artery  Normal right heart pressures.  LVEF 50 to 60% with LVEDP 21 mmHg.  RECOMMENDATIONS:   The patient has an appointment set to see Dr. Evelene Croon for consideration of aortic  valve therapy.  Continue same medical therapy.  Recommend Aspirin 81mg  daily for moderate CAD.  Indications   Aortic stenosis, severe [I35.0 (ICD-10-CM)]  Coronary artery disease of native artery of native heart with stable angina pectoris (HCC) [I25.118 (ICD-10-CM)]  Procedural Details/Technique   Technical Details The right radial area was sterilely prepped and draped. Intravenous sedation with Versed and fentanyl was administered. 1% Xylocaine was infiltrated to achieve local analgesia. Using real-time vascular ultrasound, a double wall stick with an angiocath was utilized to obtain intra-arterial access. A VUS image was saved for the permanent record.The modified Seldinger technique was used to place a 25F " Slender" sheath in the right radial artery. Weight based heparin was administered. Coronary angiography was done using 5 F catheters. Right coronary angiography was performed with a JR4. Left ventricular hemodymic recordings and angiography was done using the JR 4 catheter and hand injection. Left coronary angiography was performed with a JL 3.5 cm.  Right heart catheterization was performed by exchanging a previously placed antecubital IV angio-cath for a 5 French Slender sheath. 1% Xylocaine was used to locally nesthetize the area around the IV site. The IV catheter was wired using an .018 guidewire. The modified Seldinger technique was used to place the 5 [place] sheath. Double glove technique was used to enhance sterility. After sheath insertion, right heart cath was performed using a 5 French balloon  tipped catheter and fluoroscopic guidance. Pressures were recorded in each chamber and in the pulmonary capillary wedge position.. The main pulmonary artery O2 saturation was sampled.   Hemostasis was achieved using a pneumatic band.  During this procedure the patient is administered a total of Versed 1 mg and Fentanyl 50 mg to achieve and maintain moderate conscious sedation. The patient's heart rate, blood pressure, and oxygen saturation are monitored continuously during the procedure. The period of conscious sedation is 31 minutes, of which I was present face-to-face 100% of this time.   Estimated blood loss <50 mL.  During this procedure the patient was administered the following to achieve and maintain moderate conscious sedation: Versed 1 mg, Fentanyl 50 mcg, while the patient's heart rate, blood pressure, and oxygen saturation were continuously monitored. The period of conscious sedation was 31 minutes, of which I was present face-to-face 100% of this time.  Coronary Findings   Diagnostic  Dominance: Right  Left Anterior Descending  Ost LAD to Prox LAD lesion 75% stenosed  Ost LAD to Prox LAD lesion is 75% stenosed.  Left Circumflex  Mid Cx lesion 65% stenosed  Mid Cx lesion is 65% stenosed.  First Obtuse Marginal Branch  Vessel is large in size.  Second Obtuse Marginal Branch  Vessel is small in size.  Third Obtuse Marginal Branch  Vessel is large in size.  Right Coronary Artery  Prox RCA to Mid RCA lesion 100% stenosed  Prox RCA to Mid RCA lesion is 100% stenosed.  Mid RCA to Dist RCA lesion 100% stenosed  Mid RCA to Dist RCA lesion is 100% stenosed.  Acute Marginal Branch  Vessel is small in size.  Right Posterior Descending Artery  Collaterals  RPDA filled by collaterals from Dist LAD.    Intervention   No interventions have been documented.  Right Heart   Right Heart Pressures Hemodynamic findings consistent with pulmonary hypertension.  Left Heart   Left  Ventricle The left ventricular systolic function is normal. LV end diastolic pressure is normal. The left ventricular ejection fraction is 50-55% by visual estimate. There are LV  function abnormalities due to segmental dysfunction.  Aortic Valve There is moderate aortic valve stenosis. There is no aortic valve regurgitation. The aortic valve is calcified. There is restricted aortic valve motion.  Coronary Diagrams   Diagnostic Diagram       Implants    No implant documentation for this case.  MERGE Images   Show images for CARDIAC CATHETERIZATION   Link to Procedure Log   Procedure Log    Hemo Data    Most Recent Value  Fick Cardiac Output 6.06 L/min  Fick Cardiac Output Index 2.71 (L/min)/BSA  Aortic Mean Gradient 32.84 mmHg  Aortic Peak Gradient 51 mmHg  Aortic Valve Area 1.07  Aortic Value Area Index 0.48 cm2/BSA  RA A Wave 9 mmHg  RA V Wave 7 mmHg  RA Mean 6 mmHg  RV Systolic Pressure 26 mmHg  RV Diastolic Pressure 5 mmHg  RV EDP 8 mmHg  PA Systolic Pressure 26 mmHg  PA Diastolic Pressure 11 mmHg  PA Mean 19 mmHg  PW A Wave 15 mmHg  PW V Wave 10 mmHg  PW Mean 9 mmHg  AO Systolic Pressure 100 mmHg  AO Diastolic Pressure 61 mmHg  AO Mean 80 mmHg  LV Systolic Pressure 156 mmHg  LV Diastolic Pressure 11 mmHg  LV EDP 21 mmHg  AOp Systolic Pressure 108 mmHg  AOp Diastolic Pressure 65 mmHg  AOp Mean Pressure 87 mmHg  LVp Systolic Pressure 157 mmHg  LVp Diastolic Pressure 8 mmHg  LVp EDP Pressure 17 mmHg  QP/QS 1  TPVR Index 7.03 HRUI  TSVR Index 29.56 HRUI  PVR SVR Ratio 0.14  TPVR/TSVR Ratio 0.24    Impression:  This 67 year old gentleman has stage D, severe, symptomatic aortic stenosis with [place] Heart Association class II symptoms of exertional shortness of breath and chest discomfort consistent with chronic diastolic congestive heart failure.  His cardiac catheterization shows severe three-vessel coronary disease in addition to severe aortic stenosis.   His echocardiogram shows a trileaflet aortic valve with severely thickened and calcified leaflets with restricted mobility.  The mean gradient is 41 mmHg consistent with severe aortic stenosis.  Left ventricular ejection fraction is 60 to 65% with a focal basal septal bulge.  I think the best treatment for him is open surgical aortic valve replacement and coronary bypass graft surgery.  I would recommend using a bioprosthetic bovine pericardial prosthesis which has excellent long-term durability in his age group.  I discussed the alternative of a mechanical valve but I do not think there is any indication for that in a 67 year old patient due to the 1 to 2 %/year risk of thromboembolism and 1 to 2% risk per year of hemorrhagic complications even if Coumadin is taken and managed correctly.  I would evaluate the focal basal septal hypertrophy in the operating room with TEE and excised that if it appears to be causing any obstruction.  I reviewed the cardiac catheterization and echo findings with the patient and his son who is a nurse on 4 E and answered their questions.I discussed the operative procedure with the patient and his son including alternatives, benefits and risks; including but not limited to bleeding, blood transfusion, infection, stroke, myocardial infarction, graft failure, heart block requiring a permanent pacemaker, organ dysfunction, and death.  Desi Rowe understands and agrees to proceed.     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  I spent 60 minutes performing this consultation  and > 50% of this time was spent face to face counseling and coordinating the care of this patient's severe symptomatic aortic stenosis and three-vessel coronary disease.  [name], MD [facility] [phone]

## 2018-07-09 NOTE — Pre-Procedure Instructions (Signed)
Daniel Mann  07/09/2018      [id number] - [place], [place] - [address] [facility] [place] [address] Phone: [phone] Fax: [phone]    Your procedure is scheduled on 07/12/2018.  Report to [facility] Admitting at 0530 A.M.  Call this number if you have problems the morning of surgery:  [phone]   Remember:  Do not eat or drink after midnight.     Take these medicines the morning of surgery with A SIP OF WATER: Amlodipine (Norvasc) Ciprofloxacin (Cipro) - if needed esomeprazole (Nexium)  7 days prior to surgery STOP taking any Aleve, Naproxen, Ibuprofen, Motrin, Advil, Goody's, BC's, all herbal medications, fish oil, and all vitamins  Follow your surgeon's instructions on when to stop Asprin.  If no instructions were given by your surgeon then you will need to call the office to get those instructions.      Do not wear jewelry  Do not wear lotions, powders, or colognes, or deodorant.  Men may shave face and neck.  Do not bring valuables to the hospital.  [facility] is not responsible for any belongings or valuables.  Contacts, eyeglasses, hearing aids, dentures or bridgework may not be worn into surgery.  Leave your suitcase in the car.  After surgery it may be brought to your room.  For patients admitted to the hospital, discharge time will be determined by your treatment team.  Patients discharged the day of surgery will not be allowed to drive home.   Name and phone number of your driver:    Special instructions:   [facility]- Preparing For Surgery  Before surgery, you can play an important role. Because skin is not sterile, your skin needs to be as free of germs as possible. You can reduce the number of germs on your skin by washing with CHG (chlorahexidine gluconate) Soap before surgery.  CHG is an antiseptic cleaner which kills germs and bonds with the skin to continue killing germs even after washing.    Oral  Hygiene is also important to reduce your risk of infection.  Remember - BRUSH YOUR TEETH THE MORNING OF SURGERY WITH YOUR REGULAR TOOTHPASTE  Please do not use if you have an allergy to CHG or antibacterial soaps. If your skin becomes reddened/irritated stop using the CHG.  Do not shave (including legs and underarms) for at least 48 hours prior to first CHG shower. It is OK to shave your face.  Please follow these instructions carefully.   1. Shower the NIGHT BEFORE SURGERY and the MORNING OF SURGERY with CHG.   2. If you chose to wash your hair, wash your hair first as usual with your normal shampoo.  3. After you shampoo, rinse your hair and body thoroughly to remove the shampoo.  4. Use CHG as you would any other liquid soap. You can apply CHG directly to the skin and wash gently with a scrungie or a clean washcloth.   5. Apply the CHG Soap to your body ONLY FROM THE NECK DOWN.  Do not use on open wounds or open sores. Avoid contact with your eyes, ears, mouth and genitals (private parts). Wash Face and genitals (private parts)  with your normal soap.  6. Wash thoroughly, paying special attention to the area where your surgery will be performed.  7. Thoroughly rinse your body with warm water from the neck down.  8. DO NOT shower/wash with your normal soap after using and  rinsing off the CHG Soap.  9. Pat yourself dry with a CLEAN TOWEL.  10. Wear CLEAN PAJAMAS to bed the night before surgery, wear comfortable clothes the morning of surgery  11. Place CLEAN SHEETS on your bed the night of your first shower and DO NOT SLEEP WITH PETS.    Day of Surgery: Shower as stated above. Do not apply any deodorants/lotions.  Please wear clean clothes to the hospital/surgery center.   Remember to brush your teeth WITH YOUR REGULAR TOOTHPASTE.    Please read over the following fact sheets that you were given. Pain Booklet, Coughing and Deep Breathing, MRSA Information and Surgical Site  Infection Prevention

## 2018-07-10 ENCOUNTER — Encounter (HOSPITAL_COMMUNITY)
Admission: RE | Admit: 2018-07-10 | Discharge: 2018-07-10 | Disposition: A | Discharge status: Home / Self Care | Payer: BLUE CROSS/BLUE SHIELD | Source: Ambulatory Visit | Attending: Surgery | Admitting: Surgery

## 2018-07-10 ENCOUNTER — Encounter (HOSPITAL_COMMUNITY): Payer: Self-pay

## 2018-07-10 ENCOUNTER — Ambulatory Visit (HOSPITAL_BASED_OUTPATIENT_CLINIC_OR_DEPARTMENT_OTHER)
Admission: RE | Admit: 2018-07-10 | Discharge: 2018-07-10 | Disposition: A | Discharge status: Home / Self Care | Payer: BLUE CROSS/BLUE SHIELD | Source: Ambulatory Visit | Attending: Surgery | Admitting: Surgery

## 2018-07-10 ENCOUNTER — Ambulatory Visit (HOSPITAL_COMMUNITY)
Admission: RE | Admit: 2018-07-10 | Discharge: 2018-07-10 | Disposition: A | Discharge status: Home / Self Care | Payer: BLUE CROSS/BLUE SHIELD | Source: Ambulatory Visit | Attending: Surgery | Admitting: Surgery

## 2018-07-10 ENCOUNTER — Other Ambulatory Visit: Payer: Self-pay

## 2018-07-10 ENCOUNTER — Other Ambulatory Visit (HOSPITAL_COMMUNITY): Payer: BLUE CROSS/BLUE SHIELD

## 2018-07-10 DIAGNOSIS — I6523 Occlusion and stenosis of bilateral carotid arteries: Secondary | ICD-10-CM | POA: Diagnosis not present

## 2018-07-10 DIAGNOSIS — I350 Nonrheumatic aortic (valve) stenosis: Secondary | ICD-10-CM

## 2018-07-10 DIAGNOSIS — I2510 Atherosclerotic heart disease of native coronary artery without angina pectoris: Secondary | ICD-10-CM

## 2018-07-10 DIAGNOSIS — Z01818 Encounter for other preprocedural examination: Secondary | ICD-10-CM | POA: Diagnosis not present

## 2018-07-10 HISTORY — DX: Nonrheumatic aortic (valve) stenosis: I35.0

## 2018-07-10 HISTORY — DX: Heart failure, unspecified: I50.9

## 2018-07-10 LAB — CBC
HCT: 45.5 % (ref 39.0–52.0)
HEMOGLOBIN: 15.1 g/dL (ref 13.0–17.0)
MCH: 31.2 pg (ref 26.0–34.0)
MCHC: 33.2 g/dL (ref 30.0–36.0)
MCV: 94 fL (ref 80.0–100.0)
NRBC: 0 % (ref 0.0–0.2)
Platelets: 270 10*3/uL (ref 150–400)
RBC: 4.84 MIL/uL (ref 4.22–5.81)
RDW: 12.3 % (ref 11.5–15.5)
WBC: 7.8 10*3/uL (ref 4.0–10.5)

## 2018-07-10 LAB — BLOOD GAS, ARTERIAL
Acid-Base Excess: 1 mmol/L (ref 0.0–2.0)
BICARBONATE: 24.5 mmol/L (ref 20.0–28.0)
Drawn by: 449841
FIO2: 21
O2 Saturation: 95.9 %
PH ART: 7.461 — AB (ref 7.350–7.450)
Patient temperature: 98.6
pCO2 arterial: 34.8 mmHg (ref 32.0–48.0)
pO2, Arterial: 81.7 mmHg — ABNORMAL LOW (ref 83.0–108.0)

## 2018-07-10 LAB — URINALYSIS, ROUTINE W REFLEX MICROSCOPIC
BACTERIA UA: NONE SEEN
Bilirubin Urine: NEGATIVE
Glucose, UA: NEGATIVE mg/dL
Hgb urine dipstick: NEGATIVE
KETONES UR: NEGATIVE mg/dL
NITRITE: NEGATIVE
PROTEIN: NEGATIVE mg/dL
Specific Gravity, Urine: 1.02 (ref 1.005–1.030)
pH: 6 (ref 5.0–8.0)

## 2018-07-10 LAB — ABO/RH: ABO/RH(D): B NEG

## 2018-07-10 LAB — COMPREHENSIVE METABOLIC PANEL
ALBUMIN: 3.8 g/dL (ref 3.5–5.0)
ALK PHOS: 58 U/L (ref 38–126)
ALT: 23 U/L (ref 0–44)
AST: 22 U/L (ref 15–41)
Anion gap: 7 (ref 5–15)
BUN: 16 mg/dL (ref 8–23)
CALCIUM: 10.3 mg/dL (ref 8.9–10.3)
CO2: 21 mmol/L — AB (ref 22–32)
CREATININE: 0.65 mg/dL (ref 0.61–1.24)
Chloride: 108 mmol/L (ref 98–111)
GFR calc Af Amer: >60 mL/min (ref >60)
GFR calc non Af Amer: >60 mL/min (ref >60)
GLUCOSE: 113 mg/dL — AB (ref 70–99)
Potassium: 4.3 mmol/L (ref 3.5–5.1)
SODIUM: 136 mmol/L (ref 135–145)
Total Bilirubin: 0.7 mg/dL (ref 0.3–1.2)
Total Protein: 6.2 g/dL — ABNORMAL LOW (ref 6.5–8.1)

## 2018-07-10 LAB — PULMONARY FUNCTION TEST
DL/VA % PRED: 92 %
DL/VA: 4.34 ml/min/mmHg/L
DLCO UNC: 25.78 ml/min/mmHg
DLCO cor % pred: 72 %
DLCO cor: 25.43 ml/min/mmHg
DLCO unc % pred: 73 %
FEF 25-75 Pre: 4.11 L/sec
FEF2575-%PRED-PRE: 148 %
FEV1-%PRED-PRE: 95 %
FEV1-PRE: 3.42 L
FEV1FVC-%Pred-Pre: 114 %
FEV6-%PRED-PRE: 87 %
FEV6-Pre: 4.02 L
FEV6FVC-%Pred-Pre: 105 %
FVC-%Pred-Pre: 83 %
FVC-Pre: 4.02 L
PRE FEV1/FVC RATIO: 85 %
Pre FEV6/FVC Ratio: 100 %
RV % PRED: 91 %
RV: 2.31 L
TLC % PRED: 87 %
TLC: 6.46 L

## 2018-07-10 LAB — SURGICAL PCR SCREEN
MRSA, PCR: NEGATIVE
Staphylococcus aureus: NEGATIVE

## 2018-07-10 LAB — HEMOGLOBIN A1C
Hgb A1c MFr Bld: 5.4 % (ref 4.8–5.6)
Mean Plasma Glucose: 108.28 mg/dL

## 2018-07-10 LAB — PROTIME-INR
INR: 1.01
Prothrombin Time: 13.2 seconds (ref 11.4–15.2)

## 2018-07-10 LAB — APTT: aPTT: 31 seconds (ref 24–36)

## 2018-07-10 NOTE — Progress Notes (Signed)
Pre-CABG testing has been completed. 1-39% ICA stenosis bilaterally.  ABI's Right 1.54 Left 1.4  Palmar waveforms Right Palmar waveforms are obliterated with radial and ulnar compression. Left Palmar waveforms are obliterated with radial and ulnar compression.  07/10/18 10:27 AM Olen Cordial RVT

## 2018-07-10 NOTE — Progress Notes (Addendum)
PCP: Blane Ohara, MD  Cardiologist: Charlton Haws, MD  EKG: 07/02/18 in [facility]  Stress test: denies  ECHO: 07/04/18 in EP[facility]   Cardiac Cath: 07/02/18 in [facility]  Chest x-ray: obtained today 07/10/18 at PAT visit  Patient advised to continue 81 mg aspirin per MD, pt is aware that he has doppler and respiratory appointments today at 1100 and 1200 and will not leave until these appointments are complete.

## 2018-07-11 MED ORDER — PLASMA-LYTE 148 IV SOLN
INTRAVENOUS | Status: AC
  Administered 2018-07-12: 500 mL
  Filled 2018-07-11: qty 2.5

## 2018-07-11 MED ORDER — TRANEXAMIC ACID (OHS) BOLUS VIA INFUSION
15.0000 mg/kg | INTRAVENOUS | Status: AC
  Administered 2018-07-12: 1525.5 mg via INTRAVENOUS
  Filled 2018-07-11: qty 1526

## 2018-07-11 MED ORDER — TRANEXAMIC ACID (OHS) PUMP PRIME SOLUTION
2.0000 mg/kg | INTRAVENOUS | Status: DC
  Filled 2018-07-11: qty 2.03

## 2018-07-11 MED ORDER — SODIUM CHLORIDE 0.9 % IV SOLN
750.0000 mg | INTRAVENOUS | Status: DC
  Filled 2018-07-11: qty 750

## 2018-07-11 MED ORDER — NITROGLYCERIN IN D5W 200-5 MCG/ML-% IV SOLN
2.0000 ug/min | INTRAVENOUS | Status: DC
  Filled 2018-07-11: qty 250

## 2018-07-11 MED ORDER — NOREPINEPHRINE 4 MG/250ML-% IV SOLN
0.0000 ug/min | INTRAVENOUS | Status: DC
  Filled 2018-07-11: qty 250

## 2018-07-11 MED ORDER — DEXMEDETOMIDINE HCL IN NACL 400 MCG/100ML IV SOLN
0.1000 ug/kg/h | INTRAVENOUS | Status: AC
  Administered 2018-07-12: .2 ug/kg/h via INTRAVENOUS
  Filled 2018-07-11: qty 100

## 2018-07-11 MED ORDER — TRANEXAMIC ACID 1000 MG/10ML IV SOLN
1.5000 mg/kg/h | INTRAVENOUS | Status: AC
  Administered 2018-07-12: 1.5 mg/kg/h via INTRAVENOUS
  Filled 2018-07-11: qty 25

## 2018-07-11 MED ORDER — EPINEPHRINE PF 1 MG/ML IJ SOLN
0.0000 ug/min | INTRAVENOUS | Status: DC
  Filled 2018-07-11: qty 4

## 2018-07-11 MED ORDER — DOPAMINE-DEXTROSE 3.2-5 MG/ML-% IV SOLN
0.0000 ug/kg/min | INTRAVENOUS | Status: DC
  Filled 2018-07-11: qty 250

## 2018-07-11 MED ORDER — PHENYLEPHRINE HCL-NACL 20-0.9 MG/250ML-% IV SOLN
30.0000 ug/min | INTRAVENOUS | Status: AC
  Administered 2018-07-12: 25 ug/min via INTRAVENOUS
  Filled 2018-07-11: qty 250

## 2018-07-11 MED ORDER — VANCOMYCIN HCL 10 G IV SOLR
1500.0000 mg | INTRAVENOUS | Status: AC
  Administered 2018-07-12: 1500 mg via INTRAVENOUS
  Filled 2018-07-11: qty 1500

## 2018-07-11 MED ORDER — SODIUM CHLORIDE 0.9 % IV SOLN
1.5000 g | INTRAVENOUS | Status: AC
  Administered 2018-07-12: 1.5 g via INTRAVENOUS
  Administered 2018-07-12: .75 g via INTRAVENOUS
  Filled 2018-07-11: qty 1.5

## 2018-07-11 MED ORDER — SODIUM CHLORIDE 0.9 % IV SOLN
INTRAVENOUS | Status: DC
  Filled 2018-07-11: qty 30

## 2018-07-11 MED ORDER — POTASSIUM CHLORIDE 2 MEQ/ML IV SOLN
80.0000 meq | INTRAVENOUS | Status: DC
  Filled 2018-07-11: qty 40

## 2018-07-11 MED ORDER — MILRINONE LACTATE IN DEXTROSE 20-5 MG/100ML-% IV SOLN
0.3000 ug/kg/min | INTRAVENOUS | Status: DC
  Filled 2018-07-11: qty 100

## 2018-07-11 MED ORDER — INSULIN REGULAR(HUMAN) IN NACL 100-0.9 UT/100ML-% IV SOLN
INTRAVENOUS | Status: AC
  Administered 2018-07-12: .9 [IU]/h via INTRAVENOUS
  Filled 2018-07-11: qty 100

## 2018-07-11 MED ORDER — MAGNESIUM SULFATE 50 % IJ SOLN
40.0000 meq | INTRAMUSCULAR | Status: DC
  Filled 2018-07-11: qty 9.85

## 2018-07-11 NOTE — H&P (Signed)
[address]       [place] [address]             [phone]      Cardiothoracic Surgery Admission History and Physical   PCP is Cox, Fritzi Mandes, MD  Referring Provider is Wendall Stade, MD      Chief Complaint  Patient presents with  . Aortic Stenosis      . Coronary Artery Disease   HPI:  The patient is a 67 year old gentleman with history of hypertension, hyperlipidemia, intolerance to statins, and aortic stenosis who has been followed by Dr. Eden Emms. He had a 2D echocardiogram in April 2019 which showed a mean gradient of 38 mmHg and a peak gradient of 85 mmHg with a dimensionless index of 0.31. He had a nuclear stress test at that time there was a low risk study with ejection fraction of 64% and no significant reversible ischemia. The patient reports that over this past several months he has had some chest discomfort and shortness of breath with exertion such as walking briskly, walking up hills, or using the treadmill at the gym. He has had no symptoms with normal daily activity. He has had occasional mild dizziness but no syncope. He denies orthopnea and PND. He said no peripheral edema. He had a follow-up echocardiogram on 06/19/2018 which showed a mean gradient of 41 mmHg with a peak of 103 mmHg. The dimensionless index was 0.25. Left ventricular ejection fraction was 60 to 65%. Cardiac catheterization was performed on 07/02/2018. This confirmed severe aortic stenosis with a mean gradient of 33 mmHg and a peak to peak gradient of 51 mmHg. Coronary angiography showed severe three-vessel disease with total occlusion of the proximal codominant right coronary artery with collaterals filling the distal vessel. The distal left circumflex had a large marginal with 50 to 70% narrowing. The LAD had 75% proximal stenosis.   The patient is here today with his son who is a Engineer, civil (consulting) on 4 [employer]. 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       Past Medical  History:  Diagnosis Date  . GERD (gastroesophageal reflux disease)   . Heart murmur   . Hernia, inguinal, right   . Hyperlipidemia   . Hypertension   . Impaired fasting glucose   . Mixed hyperlipidemia   . Vitamin D deficiency         Past Surgical History:  Procedure Laterality Date  . COLONOSCOPY    . RIGHT/LEFT HEART CATH AND CORONARY ANGIOGRAPHY N/A 07/02/2018   Procedure: RIGHT/LEFT HEART CATH AND CORONARY ANGIOGRAPHY; Surgeon: Lyn Records, MD; Location: [facility]; Service: Cardiovascular; Laterality: N/A;        Family History  Problem Relation Age of Onset  . Alzheimer's disease Mother   . Renal Disease Father   . Cancer - Prostate Father    Social History  Social History        Tobacco Use  . Smoking status: Former Games developer  . Smokeless tobacco: Never Used  Substance Use Topics  . Alcohol use: Yes    Comment: WINE   . Drug use: No         Current Outpatient Medications  Medication Sig Dispense Refill  . amLODipine (NORVASC) 10 MG tablet Take 10 mg by mouth daily.    Marland Kitchen aspirin 81 MG EC tablet Take 81 mg by mouth daily.    . ciprofloxacin (CIPRO) 500 MG tablet  Take 500 mg by mouth 2 (two) times daily as needed (prostitis).     Marland Kitchen co-enzyme Q-10 30 MG capsule Take 30 mg by mouth daily.     . ergocalciferol (VITAMIN D2) 50000 UNITS capsule Take 50,000 Units by mouth 2 (two) times a week.     . esomeprazole (NEXIUM) 20 MG packet Take 20 mg by mouth daily at 12 noon.     Marland Kitchen lisinopril (PRINIVIL,ZESTRIL) 40 MG tablet Take 40 mg by mouth daily.     . Pitavastatin Calcium (LIVALO) 1 MG TABS Take 1 mg by mouth daily.      No current facility-administered medications for this visit.    No Known Allergies  Review of Systems  Constitutional: Positive for activity change. Negative for fatigue.  HENT: Negative.  Sees dentist regularly  Eyes: Negative.  Respiratory: Positive for shortness of breath.  Cardiovascular: Positive for chest pain. Negative for  palpitations and leg swelling.  Gastrointestinal: Negative.  Endocrine: Negative.  Genitourinary: Positive for frequency.  BPH, prostatitis  Musculoskeletal: Negative.  Allergic/Immunologic: Negative.  Neurological: Positive for dizziness. Negative for syncope.  Hematological: Negative.  Psychiatric/Behavioral: Negative.   BP 131/80 (BP Location: Right Arm, Patient Position: Sitting, Cuff Size: Large)  Pulse 81  Resp 16  Ht 6' (1.829 m)  Wt 225 lb (102.1 kg)  SpO2 97% Comment: ON RA  BMI 30.52 kg/m  Physical Exam  Constitutional: He is oriented to person, place, and time. He appears well-developed and well-nourished. No distress.  HENT:  Head: Normocephalic and atraumatic.  Mouth/Throat: Oropharynx is clear and moist.  Eyes: Pupils are equal, round, and reactive to light. Conjunctivae and EOM are normal.  Neck: Normal range of motion. Neck supple. No JVD present. No thyromegaly present.  Cardiovascular: Normal rate, regular rhythm and intact distal pulses.  Murmur heard. 3/6 systolic murmur RSB  Pulmonary/Chest: Effort normal and breath sounds normal. No respiratory distress.  Abdominal: Soft. Bowel sounds are normal. He exhibits no distension. There is no tenderness.  Large umbilical hernia that is easily reducible. Some discoloration of skin over hernia sac.  Musculoskeletal: Normal range of motion. He exhibits no edema.  Lymphadenopathy:  He has no cervical adenopathy.  Neurological: He is alert and oriented to person, place, and time. He has normal strength. No cranial nerve deficit or sensory deficit.  Skin: Skin is warm and dry.  Psychiatric: He has a normal mood and affect.   Diagnostic Tests:  Result status: Final result  [facility]  [address]  [place], [place] [address]  [phone]  -------------------------------------------------------------------  Transthoracic Echocardiography  Patient: Cleve, Paolillo  MR #: [id number]  Study Date:  06/19/2018  Gender: M  Age: 30  Height: 182.9 cm  Weight: 117 kg  BSA: 2.48 m^2  Pt. Status:  Room:  Layla Maw, M.D.  REFERRING Charlton Haws, M.D.  ATTENDING Armanda Magic, MD  SONOGRAPHER Randa Evens, Will  PERFORMING [facility], Outpatient  cc:  -------------------------------------------------------------------  LV EF: 60% - 65%  -------------------------------------------------------------------  Indications: (I35.0).  -------------------------------------------------------------------  History: PMH: Acquired from the patient and from the patient&'s  chart. Chest pain. Dyspnea. Aortic stenosis. Risk factors:  Hypertension. Dyslipidemia.  -------------------------------------------------------------------  Study Conclusions  - Left ventricle: The cavity size was normal. There was severe  focal basal hypertrophy. Systolic function was normal. The  estimated ejection fraction was in the range of 60% to 65%. Wall  motion was normal; there were no regional wall motion  abnormalities. Left ventricular diastolic function parameters  were normal.  - Aortic valve: Trileaflet; severely thickened, severely calcified  leaflets. Valve mobility was restricted. There was severe  stenosis. Mean gradient (S): 41 mm Hg. VTI ratio of LVOT to  aortic valve: 0.27.  - Mitral valve: There was trivial regurgitation.  - Left atrium: The atrium was mildly dilated.  - Pulmonic valve: There was trivial regurgitation.  -------------------------------------------------------------------  Study data: Comparison was made to the study of 12/19/2017. Study  status: Routine. Procedure: The patient reported no pain pre or  post test. Transthoracic echocardiography for left ventricular  function evaluation. Image quality was adequate. Study completion:  There were no complications. Transthoracic  echocardiography. M-mode, complete 2D, spectral Doppler, and color  Doppler. Birthdate: Patient birthdate:  Nov 27, 1950. Age: Patient  is 67 yr old. Sex: Gender: male. BMI: 35 kg/m^2. Blood  pressure: 150/78 Patient status: Outpatient. Study date:  Study date: 06/19/2018. Study time: 01:12 PM. Location: [facility]  -------------------------------------------------------------------  -------------------------------------------------------------------  Left ventricle: The cavity size was normal. There was severe focal  basal hypertrophy. Systolic function was normal. The estimated  ejection fraction was in the range of 60% to 65%. Wall motion was  normal; there were no regional wall motion abnormalities. The  transmitral flow pattern was normal. The deceleration time of the  early transmitral flow velocity was normal. The pulmonary vein flow  pattern was normal. The tissue Doppler parameters were normal. Left  ventricular diastolic function parameters were normal.  -------------------------------------------------------------------  Aortic valve: Trileaflet; severely thickened, severely calcified  leaflets. Valve mobility was restricted. Doppler: There was  severe stenosis. There was no regurgitation. VTI ratio of LVOT  to aortic valve: 0.27. Valve area (VTI): 0.85 cm^2. Indexed valve  area (VTI): 0.34 cm^2/m^2. Peak velocity ratio of LVOT to aortic  valve: 0.25. Valve area (Vmax): 0.8 cm^2. Indexed valve area  (Vmax): 0.32 cm^2/m^2. Mean velocity ratio of LVOT to aortic valve:  0.28. Valve area (Vmean): 0.88 cm^2. Indexed valve area (Vmean):  0.35 cm^2/m^2. Mean gradient (S): 41 mm Hg. Peak gradient (S):  103 mm Hg.  -------------------------------------------------------------------  Aorta: Aortic root: The aortic root was normal in size.  -------------------------------------------------------------------  Mitral valve: Structurally normal valve. Mobility was not  restricted. Doppler: Transvalvular velocity was within the normal  range. There was no evidence for stenosis. There was  trivial  regurgitation. Valve area by pressure half-time: 3.28 cm^2.  Indexed valve area by pressure half-time: 1.32 cm^2/m^2. Peak  gradient (D): 4 mm Hg.  -------------------------------------------------------------------  Left atrium: The atrium was mildly dilated.  -------------------------------------------------------------------  Right ventricle: The cavity size was normal. Wall thickness was  normal. Systolic function was normal.  -------------------------------------------------------------------  Pulmonic valve: Structurally normal valve. Cusp separation was  normal. Doppler: Transvalvular velocity was within the normal  range. There was no evidence for stenosis. There was trivial  regurgitation.  -------------------------------------------------------------------  Tricuspid valve: Structurally normal valve. Doppler:  Transvalvular velocity was within the normal range. There was mild  regurgitation.  -------------------------------------------------------------------  Pulmonary artery: The main pulmonary artery was normal-sized.  Systolic pressure was within the normal range.  -------------------------------------------------------------------  Right atrium: The atrium was normal in size.  -------------------------------------------------------------------  Pericardium: There was no pericardial effusion.  -------------------------------------------------------------------  Systemic veins:  Inferior vena cava: The vessel was normal in size.  -------------------------------------------------------------------  Measurements  Left ventricle Value Reference  LV ID, ED, PLAX chordal 45 mm 43 - 52  LV ID, ES, PLAX chordal 28 mm 23 - 38  LV fx shortening, PLAX chordal  38 % >=29  LV PW thickness, ED 11 mm ----------  IVS/LV PW ratio, ED (H) 1.45 <=1.3  Stroke volume, 2D 84 ml ----------  Stroke volume/bsa, 2D 34 ml/m^2 ----------  LV e&', lateral 8.92 cm/s ----------  LV  E/e&', lateral 10.98 ----------  LV e&', medial 8.05 cm/s ----------  LV E/e&', medial 12.16 ----------  LV e&', average 8.49 cm/s ----------  LV E/e&', average 11.54 ----------  Ventricular septum Value Reference  IVS thickness, ED 16 mm ----------  LVOT Value Reference  LVOT ID, S 20 mm ----------  LVOT area 3.14 cm^2 ----------  LVOT ID 20 mm ----------  LVOT peak velocity, S 129 cm/s ----------  LVOT mean velocity, S 81.1 cm/s ----------  LVOT VTI, S 26.7 cm ----------  LVOT peak gradient, S 7 mm Hg ----------  Stroke volume (SV), LVOT DP 83.9 ml ----------  Stroke index (SV/bsa), LVOT DP 33.9 ml/m^2 ----------  Aortic valve Value Reference  Aortic valve peak velocity, S 507 cm/s ----------  Aortic valve mean velocity, S 291 cm/s ----------  Aortic valve VTI, S 99.1 cm ----------  Aortic mean gradient, S 41 mm Hg ----------  Aortic peak gradient, S 103 mm Hg ----------  VTI ratio, LVOT/AV 0.27 ----------  Aortic valve area, VTI 0.85 cm^2 ----------  Aortic valve area/bsa, VTI 0.34 cm^2/m^2 ----------  Velocity ratio, peak, LVOT/AV 0.25 ----------  Aortic valve area, peak velocity 0.8 cm^2 ----------  Aortic valve area/bsa, peak 0.32 cm^2/m^2 ----------  velocity  Velocity ratio, mean, LVOT/AV 0.28 ----------  Aortic valve area, mean velocity 0.88 cm^2 ----------  Aortic valve area/bsa, mean 0.35 cm^2/m^2 ----------  velocity  Aorta Value Reference  Aortic root ID, ED 35 mm ----------  Ascending aorta ID, A-P, S 35 mm ----------  Left atrium Value Reference  LA ID, A-P, ES 45 mm ----------  LA ID/bsa, A-P 1.82 cm/m^2 <=2.2  LA volume, S 77.4 ml ----------  LA volume/bsa, S 31.2 ml/m^2 ----------  LA volume, ES, 1-p A4C 75.6 ml ----------  LA volume/bsa, ES, 1-p A4C 30.5 ml/m^2 ----------  LA volume, ES, 1-p A2C 65.8 ml ----------  LA volume/bsa, ES, 1-p A2C 26.6 ml/m^2 ----------  Mitral valve Value Reference  Mitral E-wave peak velocity 97.9 cm/s ----------  Mitral  A-wave peak velocity 72.2 cm/s ----------  Mitral deceleration time 230 ms 150 - 230  Mitral pressure half-time 67 ms ----------  Mitral peak gradient, D 4 mm Hg ----------  Mitral E/A ratio, peak 1.4 ----------  Mitral valve area, PHT, DP 3.28 cm^2 ----------  Mitral valve area/bsa, PHT, DP 1.32 cm^2/m^2 ----------  Pulmonary arteries Value Reference  PA pressure, S, DP 23 mm Hg <=30  Tricuspid valve Value Reference  Tricuspid regurg peak velocity 221 cm/s ----------  Tricuspid peak RV-RA gradient 20 mm Hg ----------  Right atrium Value Reference  RA ID, S-I, ES, A4C 44 mm 34 - 49  RA area, ES, A4C 13.3 cm^2 8.3 - 19.5  RA volume, ES, A/L 32.1 ml ----------  RA volume/bsa, ES, A/L 13 ml/m^2 ----------  Systemic veins Value Reference  Estimated CVP 3 mm Hg ----------  Right ventricle Value Reference  TAPSE 22.6 mm ----------  RV pressure, S, DP 23 mm Hg <=30  RV s&', lateral, S 14.9 cm/s ----------  Legend:  (L) and (H) mark values outside specified reference range.  -------------------------------------------------------------------  Prepared and Electronically Authenticated by  Armanda Magic, MD  [id number]:39:35  Physicians  Panel Physicians Referring Physician Case Authorizing Physician  Verdis Prime  W, MD (Primary)    Procedures  RIGHT/LEFT HEART CATH AND CORONARY ANGIOGRAPHY  Conclusion  Severe aortic stenosis, peak to peak gradient 51 mmHg, mean gradient 33 mmHg, calculated aortic valve area 1.07 cm using Fick cardiac output of 6.06 L/min  Significant coronary artery disease with total occlusion of proximal codominant RCA and significant segmental LAD stenosis. The distal circumflex before a large marginal contains eccentric 50 to 70% narrowing  Normal left main coronary artery  Normal right heart pressures.  LVEF 50 to 60% with LVEDP 21 mmHg. RECOMMENDATIONS:  The patient has an appointment set to see Dr. Evelene Croon for consideration of aortic valve therapy.    Continue same medical therapy. Recommend Aspirin 81mg  daily for moderate CAD.  Indications  Aortic stenosis, severe [I35.0 (ICD-10-CM)]  Coronary artery disease of native artery of native heart with stable angina pectoris (HCC) [I25.118 (ICD-10-CM)]  Procedural Details/Technique  Technical Details The right radial area was sterilely prepped and draped. Intravenous sedation with Versed and fentanyl was administered. 1% Xylocaine was infiltrated to achieve local analgesia. Using real-time vascular ultrasound, a double wall stick with an angiocath was utilized to obtain intra-arterial access. A VUS image was saved for the permanent record.The modified Seldinger technique was used to place a 70F " Slender" sheath in the right radial artery. Weight based heparin was administered. Coronary angiography was done using 5 F catheters. Right coronary angiography was performed with a JR4. Left ventricular hemodymic recordings and angiography was done using the JR 4 catheter and hand injection. Left coronary angiography was performed with a JL 3.5 cm.  Right heart catheterization was performed by exchanging a previously placed antecubital IV angio-cath for a 5 French Slender sheath. 1% Xylocaine was used to locally nesthetize the area around the IV site. The IV catheter was wired using an .018 guidewire. The modified Seldinger technique was used to place the 5 [place] sheath. Double glove technique was used to enhance sterility. After sheath insertion, right heart cath was performed using a 5 French balloon tipped catheter and fluoroscopic guidance. Pressures were recorded in each chamber and in the pulmonary capillary wedge position.. The main pulmonary artery O2 saturation was sampled.   Hemostasis was achieved using a pneumatic band.  During this procedure the patient is administered a total of Versed 1 mg and Fentanyl 50 mg to achieve and maintain moderate conscious sedation. The patient's heart rate, blood  pressure, and oxygen saturation are monitored continuously during the procedure. The period of conscious sedation is 31 minutes, of which I was present face-to-face 100% of this time.   Estimated blood loss <50 mL.  During this procedure the patient was administered the following to achieve and maintain moderate conscious sedation: Versed 1 mg, Fentanyl 50 mcg, while the patient's heart rate, blood pressure, and oxygen saturation were continuously monitored. The period of conscious sedation was 31 minutes, of which I was present face-to-face 100% of this time.  Coronary Findings  Diagnostic  Dominance: Right  Left Anterior Descending  Ost LAD to Prox LAD lesion 75% stenosed  Ost LAD to Prox LAD lesion is 75% stenosed.  Left Circumflex  Mid Cx lesion 65% stenosed  Mid Cx lesion is 65% stenosed.  First Obtuse Marginal Branch  Vessel is large in size.  Second Obtuse Marginal Branch  Vessel is small in size.  Third Obtuse Marginal Branch  Vessel is large in size.  Right Coronary Artery  Prox RCA to Mid RCA lesion 100% stenosed  Prox RCA to Mid  RCA lesion is 100% stenosed.  Mid RCA to Dist RCA lesion 100% stenosed  Mid RCA to Dist RCA lesion is 100% stenosed.  Acute Marginal Branch  Vessel is small in size.  Right Posterior Descending Artery  Collaterals  RPDA filled by collaterals from Dist LAD.    Intervention  No interventions have been documented.  Right Heart  Right Heart Pressures Hemodynamic findings consistent with pulmonary hypertension.  Left Heart  Left Ventricle The left ventricular systolic function is normal. LV end diastolic pressure is normal. The left ventricular ejection fraction is 50-55% by visual estimate. There are LV function abnormalities due to segmental dysfunction.  Aortic Valve There is moderate aortic valve stenosis. There is no aortic valve regurgitation. The aortic valve is calcified. There is restricted aortic valve motion.  Coronary Diagrams    Diagnostic Diagram     Implants     No implant documentation for this case.  MERGE Images  Link to Procedure Log   Show images for CARDIAC CATHETERIZATION Procedure Log  Hemo Data   Most Recent Value  Fick Cardiac Output 6.06 L/min  Fick Cardiac Output Index 2.71 (L/min)/BSA  Aortic Mean Gradient 32.84 mmHg  Aortic Peak Gradient 51 mmHg  Aortic Valve Area 1.07  Aortic Value Area Index 0.48 cm2/BSA  RA A Wave 9 mmHg  RA V Wave 7 mmHg  RA Mean 6 mmHg  RV Systolic Pressure 26 mmHg  RV Diastolic Pressure 5 mmHg  RV EDP 8 mmHg  PA Systolic Pressure 26 mmHg  PA Diastolic Pressure 11 mmHg  PA Mean 19 mmHg  PW A Wave 15 mmHg  PW V Wave 10 mmHg  PW Mean 9 mmHg  AO Systolic Pressure 100 mmHg  AO Diastolic Pressure 61 mmHg  AO Mean 80 mmHg  LV Systolic Pressure 156 mmHg  LV Diastolic Pressure 11 mmHg  LV EDP 21 mmHg  AOp Systolic Pressure 108 mmHg  AOp Diastolic Pressure 65 mmHg  AOp Mean Pressure 87 mmHg  LVp Systolic Pressure 157 mmHg  LVp Diastolic Pressure 8 mmHg  LVp EDP Pressure 17 mmHg  QP/QS 1  TPVR Index 7.03 HRUI  TSVR Index 29.56 HRUI  PVR SVR Ratio 0.14  TPVR/TSVR Ratio 0.24    Impression:   This 67 year old gentleman has stage D, severe, symptomatic aortic stenosis with [place] Heart Association class II symptoms of exertional shortness of breath and chest discomfort consistent with chronic diastolic congestive heart failure. His cardiac catheterization shows severe three-vessel coronary disease in addition to severe aortic stenosis. His echocardiogram shows a trileaflet aortic valve with severely thickened and calcified leaflets with restricted mobility. The mean gradient is 41 mmHg consistent with severe aortic stenosis. Left ventricular ejection fraction is 60 to 65% with a focal basal septal bulge. I think the best treatment for him is open surgical aortic valve replacement and coronary bypass graft surgery. I would recommend using a bioprosthetic bovine  pericardial prosthesis which has excellent long-term durability in his age group. I discussed the alternative of a mechanical valve but I do not think there is any indication for that in a 67 year old patient due to the 1 to 2 %/year risk of thromboembolism and 1 to 2% risk per year of hemorrhagic complications even if Coumadin is taken and managed correctly. I would evaluate the focal basal septal hypertrophy in the operating room with TEE and excised that if it appears to be causing any obstruction. I reviewed the cardiac catheterization and echo findings with the patient and  his son who is a Engineer, civil (consulting) on 4 E and answered their questions.I discussed the operative procedure with the patient and his son including alternatives, benefits and risks; including but not limited to bleeding, blood transfusion, infection, stroke, myocardial infarction, graft failure, heart block requiring a permanent pacemaker, organ dysfunction, and death. Tomaz Janis understands and agrees to proceed.  Plan:   Aortic valve replacement using a bioprosthetic valve and coronary artery bypass graft surgery.     [name], MD  [facility]  [phone]

## 2018-07-11 NOTE — Anesthesia Preprocedure Evaluation (Addendum)
Anesthesia Evaluation  Patient identified by MRN, date of birth, ID band Patient awake    Reviewed: Allergy & Precautions, NPO status , Patient's Chart, lab work & pertinent test results  History of Anesthesia Complications Negative for: history of anesthetic complications  Airway Mallampati: II  TM Distance: >3 FB Neck ROM: Full    Dental  (+) Dental Advisory Given   Pulmonary former smoker,    breath sounds clear to auscultation       Cardiovascular hypertension, Pt. on medications + CAD and +CHF  + Valvular Problems/Murmurs AS  Rhythm:Regular Rate:Normal + Systolic murmurs  '19 Carotid US - 1-39% b/l ICAS  '19 Cath - Severe aortic stenosis, peak to peak gradient 51 mmHg, mean gradient 33 mmHg, calculated aortic valve area 1.07 cm using Fick cardiac output of 6.06 L/min Significant coronary artery disease with total occlusion of proximal codominant RCA and significant segmental LAD stenosis.  The distal circumflex before a large marginal contains eccentric 50 to 70% narrowing Normal left main coronary artery Normal right heart pressures. LVEF 50 to 60% with LVEDP 21 mmHg.  '19 TTE - There was severe focal basal hypertrophy. EF 60% to 65%. Wall motion was normal. Trileaflet AV; severely thickened, severely calcified leaflets. Valve mobility was restricted. There was severe stenosis. VTI ratio of LVOT to aortic valve: 0.27. Valve area (VTI): 0.85 cm^2. Indexed valve area (VTI): 0.34 cm^2/m^2. Peak velocity ratio of LVOT to aortic valve: 0.25. Valve area (Vmax): 0.8 cm^2. Indexed valve area (Vmax): 0.32 cm^2/m^2. Mean velocity ratio of LVOT to aortic valve: 0.28. Valve area (Vmean): 0.88 cm^2. Indexed valve area (Vmean): 0.35 cm^2/m^2.    Mean gradient (S): 41 mm Hg. Peak gradient (S): 103 mm Hg. Trivial MR. Mildly dilated LA. Trivial PR. Mild TR.     Neuro/Psych negative neurological ROS  negative psych ROS    GI/Hepatic Neg liver ROS, GERD  Medicated,  Endo/Other   Obesity   Renal/GU negative Renal ROS     Musculoskeletal negative musculoskeletal ROS (+)   Abdominal   Peds  Hematology negative hematology ROS (+)   Anesthesia Other Findings   Reproductive/Obstetrics                            Anesthesia Physical Anesthesia Plan  ASA: IV  Anesthesia Plan: General   Post-op Pain Management:    Induction: Intravenous  PONV Risk Score and Plan: 2 and Treatment may vary due to age or medical condition, Ondansetron, Dexamethasone and Midazolam  Airway Management Planned: Oral ETT  Additional Equipment: Arterial line, CVP, PA Cath, TEE and Ultrasound Guidance Line Placement  Intra-op Plan:   Post-operative Plan: Post-operative intubation/ventilation  Informed Consent: I have reviewed the patients History and Physical, chart, labs and discussed the procedure including the risks, benefits and alternatives for the proposed anesthesia with the patient or authorized representative who has indicated his/her understanding and acceptance.   Dental advisory given  Plan Discussed with: CRNA and Anesthesiologist  Anesthesia Plan Comments:        Anesthesia Quick Evaluation

## 2018-07-12 ENCOUNTER — Inpatient Hospital Stay (HOSPITAL_COMMUNITY): Payer: BLUE CROSS/BLUE SHIELD

## 2018-07-12 ENCOUNTER — Encounter (HOSPITAL_COMMUNITY)
Admission: RE | Disposition: A | Discharge status: Home / Self Care | Payer: Self-pay | Source: Home / Self Care | Attending: Surgery

## 2018-07-12 ENCOUNTER — Inpatient Hospital Stay (HOSPITAL_COMMUNITY): Payer: BLUE CROSS/BLUE SHIELD | Admitting: Physician Assistant

## 2018-07-12 ENCOUNTER — Inpatient Hospital Stay (HOSPITAL_COMMUNITY)
Admission: RE | Admit: 2018-07-12 | Discharge: 2018-07-16 | DRG: 220 | Disposition: A | Discharge status: Home / Self Care | Payer: BLUE CROSS/BLUE SHIELD | Attending: Surgery | Admitting: Surgery

## 2018-07-12 ENCOUNTER — Inpatient Hospital Stay (HOSPITAL_COMMUNITY): Payer: BLUE CROSS/BLUE SHIELD | Admitting: Registered Nurse

## 2018-07-12 DIAGNOSIS — Z951 Presence of aortocoronary bypass graft: Secondary | ICD-10-CM

## 2018-07-12 DIAGNOSIS — I350 Nonrheumatic aortic (valve) stenosis: Secondary | ICD-10-CM

## 2018-07-12 DIAGNOSIS — I110 Hypertensive heart disease with heart failure: Secondary | ICD-10-CM | POA: Diagnosis not present

## 2018-07-12 DIAGNOSIS — I083 Combined rheumatic disorders of mitral, aortic and tricuspid valves: Secondary | ICD-10-CM | POA: Diagnosis not present

## 2018-07-12 DIAGNOSIS — D62 Acute posthemorrhagic anemia: Secondary | ICD-10-CM | POA: Diagnosis not present

## 2018-07-12 DIAGNOSIS — Z6831 Body mass index (BMI) 31.0-31.9, adult: Secondary | ICD-10-CM

## 2018-07-12 DIAGNOSIS — J90 Pleural effusion, not elsewhere classified: Secondary | ICD-10-CM | POA: Diagnosis not present

## 2018-07-12 DIAGNOSIS — K219 Gastro-esophageal reflux disease without esophagitis: Secondary | ICD-10-CM | POA: Diagnosis present

## 2018-07-12 DIAGNOSIS — E8770 Fluid overload, unspecified: Secondary | ICD-10-CM | POA: Diagnosis not present

## 2018-07-12 DIAGNOSIS — J984 Other disorders of lung: Secondary | ICD-10-CM | POA: Diagnosis not present

## 2018-07-12 DIAGNOSIS — Z006 Encounter for examination for normal comparison and control in clinical research program: Secondary | ICD-10-CM | POA: Diagnosis not present

## 2018-07-12 DIAGNOSIS — I2510 Atherosclerotic heart disease of native coronary artery without angina pectoris: Secondary | ICD-10-CM | POA: Diagnosis present

## 2018-07-12 DIAGNOSIS — I5032 Chronic diastolic (congestive) heart failure: Secondary | ICD-10-CM | POA: Diagnosis present

## 2018-07-12 DIAGNOSIS — E669 Obesity, unspecified: Secondary | ICD-10-CM | POA: Diagnosis present

## 2018-07-12 DIAGNOSIS — J9811 Atelectasis: Secondary | ICD-10-CM | POA: Diagnosis not present

## 2018-07-12 DIAGNOSIS — K5900 Constipation, unspecified: Secondary | ICD-10-CM | POA: Diagnosis not present

## 2018-07-12 DIAGNOSIS — Z79899 Other long term (current) drug therapy: Secondary | ICD-10-CM

## 2018-07-12 DIAGNOSIS — I358 Other nonrheumatic aortic valve disorders: Secondary | ICD-10-CM | POA: Diagnosis not present

## 2018-07-12 DIAGNOSIS — E782 Mixed hyperlipidemia: Secondary | ICD-10-CM | POA: Diagnosis not present

## 2018-07-12 DIAGNOSIS — I371 Nonrheumatic pulmonary valve insufficiency: Secondary | ICD-10-CM | POA: Diagnosis not present

## 2018-07-12 DIAGNOSIS — Z952 Presence of prosthetic heart valve: Secondary | ICD-10-CM

## 2018-07-12 DIAGNOSIS — Z953 Presence of xenogenic heart valve: Secondary | ICD-10-CM

## 2018-07-12 DIAGNOSIS — Z87891 Personal history of nicotine dependence: Secondary | ICD-10-CM | POA: Diagnosis not present

## 2018-07-12 DIAGNOSIS — Z7982 Long term (current) use of aspirin: Secondary | ICD-10-CM | POA: Diagnosis not present

## 2018-07-12 DIAGNOSIS — Z452 Encounter for adjustment and management of vascular access device: Secondary | ICD-10-CM | POA: Diagnosis not present

## 2018-07-12 HISTORY — PX: TEE WITHOUT CARDIOVERSION: SHX5443

## 2018-07-12 HISTORY — PX: AORTIC VALVE REPLACEMENT: SHX41

## 2018-07-12 HISTORY — DX: Presence of aortocoronary bypass graft: Z95.1

## 2018-07-12 HISTORY — PX: CORONARY ARTERY BYPASS GRAFT: SHX141

## 2018-07-12 LAB — POCT I-STAT 3, ART BLOOD GAS (G3+)
Acid-Base Excess: 3 mmol/L — ABNORMAL HIGH (ref 0.0–2.0)
Acid-base deficit: 3 mmol/L — ABNORMAL HIGH (ref 0.0–2.0)
Acid-base deficit: 3 mmol/L — ABNORMAL HIGH (ref 0.0–2.0)
Acid-base deficit: 3 mmol/L — ABNORMAL HIGH (ref 0.0–2.0)
Acid-base deficit: 3 mmol/L — ABNORMAL HIGH (ref 0.0–2.0)
BICARBONATE: 23.4 mmol/L (ref 20.0–28.0)
BICARBONATE: 22.8 mmol/L (ref 20.0–28.0)
Bicarbonate: 28.5 mmol/L — ABNORMAL HIGH (ref 20.0–28.0)
Bicarbonate: 22.8 mmol/L (ref 20.0–28.0)
Bicarbonate: 22.5 mmol/L (ref 20.0–28.0)
O2 SAT: 100 %
O2 SAT: 97 %
O2 Saturation: 95 %
O2 Saturation: 97 %
O2 Saturation: 96 %
PCO2 ART: 46.7 mmHg (ref 32.0–48.0)
PCO2 ART: 45.7 mmHg (ref 32.0–48.0)
PCO2 ART: 42.1 mmHg (ref 32.0–48.0)
PH ART: 7.394 (ref 7.350–7.450)
PH ART: 7.355 (ref 7.350–7.450)
PH ART: 7.312 — AB (ref 7.350–7.450)
PH ART: 7.336 — AB (ref 7.350–7.450)
PO2 ART: 411 mmHg — AB (ref 83.0–108.0)
PO2 ART: 91 mmHg (ref 83.0–108.0)
PO2 ART: 88 mmHg (ref 83.0–108.0)
Patient temperature: 35.6
Patient temperature: 37.1
TCO2: 30 mmol/L (ref 22–32)
TCO2: 24 mmol/L (ref 22–32)
TCO2: 25 mmol/L (ref 22–32)
TCO2: 24 mmol/L (ref 22–32)
TCO2: 24 mmol/L (ref 22–32)
pCO2 arterial: 40.9 mmHg (ref 32.0–48.0)
pCO2 arterial: 45.3 mmHg (ref 32.0–48.0)
pH, Arterial: 7.311 — ABNORMAL LOW (ref 7.350–7.450)
pO2, Arterial: 77 mmHg — ABNORMAL LOW (ref 83.0–108.0)
pO2, Arterial: 93 mmHg (ref 83.0–108.0)

## 2018-07-12 LAB — POCT I-STAT, CHEM 8
BUN: 14 mg/dL (ref 8–23)
BUN: 11 mg/dL (ref 8–23)
BUN: 12 mg/dL (ref 8–23)
BUN: 12 mg/dL (ref 8–23)
BUN: 12 mg/dL (ref 8–23)
BUN: 11 mg/dL (ref 8–23)
BUN: 12 mg/dL (ref 8–23)
BUN: 12 mg/dL (ref 8–23)
CALCIUM ION: 1.28 mmol/L (ref 1.15–1.40)
CHLORIDE: 103 mmol/L (ref 98–111)
CREATININE: 0.6 mg/dL — AB (ref 0.61–1.24)
CREATININE: 0.6 mg/dL — AB (ref 0.61–1.24)
CREATININE: 0.5 mg/dL — AB (ref 0.61–1.24)
CREATININE: 0.5 mg/dL — AB (ref 0.61–1.24)
Calcium, Ion: 1.4 mmol/L (ref 1.15–1.40)
Calcium, Ion: 1.1 mmol/L — ABNORMAL LOW (ref 1.15–1.40)
Calcium, Ion: 1.35 mmol/L (ref 1.15–1.40)
Calcium, Ion: 1.23 mmol/L (ref 1.15–1.40)
Calcium, Ion: 1.23 mmol/L (ref 1.15–1.40)
Calcium, Ion: 1.24 mmol/L (ref 1.15–1.40)
Calcium, Ion: 1.29 mmol/L (ref 1.15–1.40)
Chloride: 101 mmol/L (ref 98–111)
Chloride: 99 mmol/L (ref 98–111)
Chloride: 102 mmol/L (ref 98–111)
Chloride: 100 mmol/L (ref 98–111)
Chloride: 102 mmol/L (ref 98–111)
Chloride: 103 mmol/L (ref 98–111)
Chloride: 105 mmol/L (ref 98–111)
Creatinine, Ser: 0.4 mg/dL — ABNORMAL LOW (ref 0.61–1.24)
Creatinine, Ser: 0.5 mg/dL — ABNORMAL LOW (ref 0.61–1.24)
Creatinine, Ser: 0.5 mg/dL — ABNORMAL LOW (ref 0.61–1.24)
Creatinine, Ser: 0.6 mg/dL — ABNORMAL LOW (ref 0.61–1.24)
Glucose, Bld: 104 mg/dL — ABNORMAL HIGH (ref 70–99)
Glucose, Bld: 110 mg/dL — ABNORMAL HIGH (ref 70–99)
Glucose, Bld: 123 mg/dL — ABNORMAL HIGH (ref 70–99)
Glucose, Bld: 158 mg/dL — ABNORMAL HIGH (ref 70–99)
Glucose, Bld: 161 mg/dL — ABNORMAL HIGH (ref 70–99)
Glucose, Bld: 127 mg/dL — ABNORMAL HIGH (ref 70–99)
Glucose, Bld: 151 mg/dL — ABNORMAL HIGH (ref 70–99)
Glucose, Bld: 160 mg/dL — ABNORMAL HIGH (ref 70–99)
HCT: 32 % — ABNORMAL LOW (ref 39.0–52.0)
HCT: 36 % — ABNORMAL LOW (ref 39.0–52.0)
HCT: 27 % — ABNORMAL LOW (ref 39.0–52.0)
HEMATOCRIT: 37 % — AB (ref 39.0–52.0)
HEMATOCRIT: 29 % — AB (ref 39.0–52.0)
HEMATOCRIT: 30 % — AB (ref 39.0–52.0)
HEMATOCRIT: 30 % — AB (ref 39.0–52.0)
HEMATOCRIT: 29 % — AB (ref 39.0–52.0)
HEMOGLOBIN: 9.2 g/dL — AB (ref 13.0–17.0)
HEMOGLOBIN: 10.2 g/dL — AB (ref 13.0–17.0)
HEMOGLOBIN: 10.2 g/dL — AB (ref 13.0–17.0)
HEMOGLOBIN: 9.9 g/dL — AB (ref 13.0–17.0)
Hemoglobin: 12.6 g/dL — ABNORMAL LOW (ref 13.0–17.0)
Hemoglobin: 10.9 g/dL — ABNORMAL LOW (ref 13.0–17.0)
Hemoglobin: 12.2 g/dL — ABNORMAL LOW (ref 13.0–17.0)
Hemoglobin: 9.9 g/dL — ABNORMAL LOW (ref 13.0–17.0)
POTASSIUM: 4.2 mmol/L (ref 3.5–5.1)
POTASSIUM: 4.7 mmol/L (ref 3.5–5.1)
POTASSIUM: 5.5 mmol/L — AB (ref 3.5–5.1)
POTASSIUM: 4.7 mmol/L (ref 3.5–5.1)
Potassium: 4.9 mmol/L (ref 3.5–5.1)
Potassium: 5 mmol/L (ref 3.5–5.1)
Potassium: 3.9 mmol/L (ref 3.5–5.1)
Potassium: 4.9 mmol/L (ref 3.5–5.1)
SODIUM: 137 mmol/L (ref 135–145)
SODIUM: 134 mmol/L — AB (ref 135–145)
SODIUM: 138 mmol/L (ref 135–145)
Sodium: 137 mmol/L (ref 135–145)
Sodium: 136 mmol/L (ref 135–145)
Sodium: 133 mmol/L — ABNORMAL LOW (ref 135–145)
Sodium: 135 mmol/L (ref 135–145)
Sodium: 137 mmol/L (ref 135–145)
TCO2: 28 mmol/L (ref 22–32)
TCO2: 26 mmol/L (ref 22–32)
TCO2: 28 mmol/L (ref 22–32)
TCO2: 28 mmol/L (ref 22–32)
TCO2: 28 mmol/L (ref 22–32)
TCO2: 24 mmol/L (ref 22–32)
TCO2: 26 mmol/L (ref 22–32)
TCO2: 24 mmol/L (ref 22–32)

## 2018-07-12 LAB — CBC
HEMATOCRIT: 36.1 % — AB (ref 39.0–52.0)
HEMOGLOBIN: 11.7 g/dL — AB (ref 13.0–17.0)
MCH: 30.4 pg (ref 26.0–34.0)
MCHC: 32.4 g/dL (ref 30.0–36.0)
MCV: 93.8 fL (ref 80.0–100.0)
Platelets: 188 10*3/uL (ref 150–400)
RBC: 3.85 MIL/uL — AB (ref 4.22–5.81)
RDW: 12.3 % (ref 11.5–15.5)
WBC: 17.2 10*3/uL — ABNORMAL HIGH (ref 4.0–10.5)
nRBC: 0 % (ref 0.0–0.2)

## 2018-07-12 LAB — GLUCOSE, CAPILLARY
GLUCOSE-CAPILLARY: 95 mg/dL (ref 70–99)
GLUCOSE-CAPILLARY: 83 mg/dL (ref 70–99)
Glucose-Capillary: 116 mg/dL — ABNORMAL HIGH (ref 70–99)
Glucose-Capillary: 122 mg/dL — ABNORMAL HIGH (ref 70–99)
Glucose-Capillary: 120 mg/dL — ABNORMAL HIGH (ref 70–99)

## 2018-07-12 LAB — CREATININE, SERUM
CREATININE: 0.6 mg/dL — AB (ref 0.61–1.24)
GFR calc non Af Amer: >60 mL/min (ref >60)

## 2018-07-12 LAB — APTT: aPTT: 38 seconds — ABNORMAL HIGH (ref 24–36)

## 2018-07-12 LAB — PROTIME-INR
INR: 1.48
Prothrombin Time: 17.8 seconds — ABNORMAL HIGH (ref 11.4–15.2)

## 2018-07-12 LAB — ECHO TEE: AVA: 0.82 cm2

## 2018-07-12 LAB — PREPARE RBC (CROSSMATCH)

## 2018-07-12 LAB — PLATELET COUNT: PLATELETS: 190 10*3/uL (ref 150–400)

## 2018-07-12 LAB — HEMOGLOBIN AND HEMATOCRIT, BLOOD
HEMATOCRIT: 30 % — AB (ref 39.0–52.0)
Hemoglobin: 10.1 g/dL — ABNORMAL LOW (ref 13.0–17.0)

## 2018-07-12 SURGERY — REPLACEMENT, AORTIC VALVE, OPEN
Anesthesia: General | Site: Chest

## 2018-07-12 MED ORDER — GLYCOPYRROLATE 0.2 MG/ML IJ SOLN
INTRAMUSCULAR | Status: DC | PRN
  Administered 2018-07-12: 0.2 mg via INTRAVENOUS

## 2018-07-12 MED ORDER — CHLORHEXIDINE GLUCONATE 0.12 % MT SOLN
15.0000 mL | OROMUCOSAL | Status: AC
  Administered 2018-07-12: 15 mL via OROMUCOSAL

## 2018-07-12 MED ORDER — ONDANSETRON HCL 4 MG/2ML IJ SOLN
INTRAMUSCULAR | Status: AC
  Filled 2018-07-12: qty 2

## 2018-07-12 MED ORDER — PROPOFOL 10 MG/ML IV BOLUS
INTRAVENOUS | Status: AC
  Filled 2018-07-12: qty 20

## 2018-07-12 MED ORDER — SODIUM CHLORIDE 0.9% FLUSH
3.0000 mL | Freq: Two times a day (BID) | INTRAVENOUS | Status: DC
  Administered 2018-07-13 – 2018-07-14 (×3): 3 mL via INTRAVENOUS

## 2018-07-12 MED ORDER — BISACODYL 10 MG RE SUPP: 10.0000 mg | Freq: Every day | RECTAL | Status: DC

## 2018-07-12 MED ORDER — METOPROLOL TARTRATE 12.5 MG HALF TABLET
ORAL_TABLET | ORAL | Status: AC
  Filled 2018-07-12: qty 1

## 2018-07-12 MED ORDER — BISACODYL 5 MG PO TBEC
10.0000 mg | DELAYED_RELEASE_TABLET | Freq: Every day | ORAL | Status: DC
  Administered 2018-07-13 – 2018-07-14 (×2): 10 mg via ORAL
  Filled 2018-07-12 (×2): qty 2

## 2018-07-12 MED ORDER — FAMOTIDINE IN NACL 20-0.9 MG/50ML-% IV SOLN
20.0000 mg | Freq: Two times a day (BID) | INTRAVENOUS | Status: DC
  Administered 2018-07-12: 20 mg via INTRAVENOUS
  Filled 2018-07-12 (×2): qty 50

## 2018-07-12 MED ORDER — CHLORHEXIDINE GLUCONATE 0.12 % MT SOLN
OROMUCOSAL | Status: AC
  Administered 2018-07-12: 1 mL
  Filled 2018-07-12: qty 15

## 2018-07-12 MED ORDER — MORPHINE SULFATE (PF) 2 MG/ML IV SOLN
2.0000 mg | INTRAVENOUS | Status: DC | PRN
  Filled 2018-07-12: qty 1

## 2018-07-12 MED ORDER — CHLORHEXIDINE GLUCONATE 4 % EX LIQD: 30.0000 mL | CUTANEOUS | Status: DC

## 2018-07-12 MED ORDER — ORAL CARE MOUTH RINSE
15.0000 mL | Freq: Two times a day (BID) | OROMUCOSAL | Status: DC
  Administered 2018-07-13 – 2018-07-16 (×6): 15 mL via OROMUCOSAL

## 2018-07-12 MED ORDER — HEPARIN SODIUM (PORCINE) 1000 UNIT/ML IJ SOLN
INTRAMUSCULAR | Status: DC | PRN
  Administered 2018-07-12: 36000 [IU] via INTRAVENOUS

## 2018-07-12 MED ORDER — LIDOCAINE 2% (20 MG/ML) 5 ML SYRINGE
INTRAMUSCULAR | Status: DC | PRN
  Administered 2018-07-12: 100 mg via INTRAVENOUS

## 2018-07-12 MED ORDER — THROMBIN 5000 UNITS EX SOLR
CUTANEOUS | Status: AC
  Filled 2018-07-12: qty 15000

## 2018-07-12 MED ORDER — EPHEDRINE SULFATE 50 MG/ML IJ SOLN
INTRAMUSCULAR | Status: DC | PRN
  Administered 2018-07-12: 5 mg via INTRAVENOUS
  Administered 2018-07-12: 10 mg via INTRAVENOUS

## 2018-07-12 MED ORDER — METOPROLOL TARTRATE 12.5 MG HALF TABLET
12.5000 mg | ORAL_TABLET | Freq: Two times a day (BID) | ORAL | Status: DC
  Administered 2018-07-12: 12.5 mg via ORAL
  Filled 2018-07-12: qty 1

## 2018-07-12 MED ORDER — VANCOMYCIN HCL IN DEXTROSE 1-5 GM/200ML-% IV SOLN
1000.0000 mg | Freq: Once | INTRAVENOUS | Status: AC
  Administered 2018-07-12: 1000 mg via INTRAVENOUS
  Filled 2018-07-12: qty 200

## 2018-07-12 MED ORDER — ALBUMIN HUMAN 5 % IV SOLN
INTRAVENOUS | Status: DC | PRN
  Administered 2018-07-12: 13:00:00 via INTRAVENOUS

## 2018-07-12 MED ORDER — SODIUM CHLORIDE 0.9 % IV SOLN: 250.0000 mL | INTRAVENOUS | Status: DC

## 2018-07-12 MED ORDER — MIDAZOLAM HCL 10 MG/2ML IJ SOLN
INTRAMUSCULAR | Status: AC
  Filled 2018-07-12: qty 2

## 2018-07-12 MED ORDER — HEMOSTATIC AGENTS (NO CHARGE) OPTIME
TOPICAL | Status: DC | PRN
  Administered 2018-07-12 (×3): 1 via TOPICAL

## 2018-07-12 MED ORDER — THROMBIN 5000 UNITS EX SOLR
CUTANEOUS | Status: DC | PRN
  Administered 2018-07-12 (×3): 5000 [IU] via TOPICAL

## 2018-07-12 MED ORDER — FENTANYL CITRATE (PF) 250 MCG/5ML IJ SOLN
INTRAMUSCULAR | Status: AC
  Filled 2018-07-12: qty 30

## 2018-07-12 MED ORDER — LACTATED RINGERS IV SOLN
INTRAVENOUS | Status: DC
  Administered 2018-07-12: 20:00:00 via INTRAVENOUS

## 2018-07-12 MED ORDER — DEXMEDETOMIDINE HCL IN NACL 400 MCG/100ML IV SOLN: 0.0000 ug/kg/h | INTRAVENOUS | Status: DC

## 2018-07-12 MED ORDER — LACTATED RINGERS IV SOLN
INTRAVENOUS | Status: DC | PRN
  Administered 2018-07-12: 07:00:00 via INTRAVENOUS

## 2018-07-12 MED ORDER — DEXAMETHASONE SODIUM PHOSPHATE 10 MG/ML IJ SOLN
INTRAMUSCULAR | Status: AC
  Filled 2018-07-12: qty 1

## 2018-07-12 MED ORDER — SODIUM CHLORIDE 0.9 % IV SOLN
INTRAVENOUS | Status: DC
  Administered 2018-07-12: 15:00:00 via INTRAVENOUS

## 2018-07-12 MED ORDER — ESMOLOL HCL 100 MG/10ML IV SOLN
INTRAVENOUS | Status: AC
  Filled 2018-07-12: qty 10

## 2018-07-12 MED ORDER — LACTATED RINGERS IV SOLN
INTRAVENOUS | Status: DC | PRN
  Administered 2018-07-12 (×2): via INTRAVENOUS

## 2018-07-12 MED ORDER — MAGNESIUM SULFATE 4 GM/100ML IV SOLN
4.0000 g | Freq: Once | INTRAVENOUS | Status: AC
  Administered 2018-07-12: 4 g via INTRAVENOUS
  Filled 2018-07-12: qty 100

## 2018-07-12 MED ORDER — PHENYLEPHRINE 40 MCG/ML (10ML) SYRINGE FOR IV PUSH (FOR BLOOD PRESSURE SUPPORT)
PREFILLED_SYRINGE | INTRAVENOUS | Status: AC
  Filled 2018-07-12: qty 10

## 2018-07-12 MED ORDER — SODIUM CHLORIDE 0.9% IV SOLUTION: Freq: Once | INTRAVENOUS | Status: DC

## 2018-07-12 MED ORDER — OXYCODONE HCL 5 MG PO TABS
5.0000 mg | ORAL_TABLET | ORAL | Status: DC | PRN
  Administered 2018-07-13 (×3): 5 mg via ORAL
  Filled 2018-07-12 (×3): qty 1

## 2018-07-12 MED ORDER — ACETAMINOPHEN 650 MG RE SUPP
650.0000 mg | Freq: Once | RECTAL | Status: AC
  Administered 2018-07-12: 650 mg via RECTAL

## 2018-07-12 MED ORDER — ONDANSETRON HCL 4 MG/2ML IJ SOLN
INTRAMUSCULAR | Status: DC | PRN
  Administered 2018-07-12: 4 mg via INTRAVENOUS

## 2018-07-12 MED ORDER — SODIUM CHLORIDE 0.9 % IV SOLN
INTRAVENOUS | Status: DC | PRN
  Administered 2018-07-12: 14:00:00 via INTRAVENOUS

## 2018-07-12 MED ORDER — EPHEDRINE 5 MG/ML INJ
INTRAVENOUS | Status: AC
  Filled 2018-07-12: qty 10

## 2018-07-12 MED ORDER — TRAMADOL HCL 50 MG PO TABS: 50.0000 mg | ORAL_TABLET | ORAL | Status: DC | PRN

## 2018-07-12 MED ORDER — VECURONIUM BROMIDE 10 MG IV SOLR
INTRAVENOUS | Status: DC | PRN
  Administered 2018-07-12 (×6): 5 mg via INTRAVENOUS

## 2018-07-12 MED ORDER — PRAVASTATIN SODIUM 20 MG PO TABS
20.0000 mg | ORAL_TABLET | Freq: Every day | ORAL | Status: DC
  Administered 2018-07-13 – 2018-07-15 (×3): 20 mg via ORAL
  Filled 2018-07-12 (×4): qty 1

## 2018-07-12 MED ORDER — SODIUM CHLORIDE 0.9 % IV SOLN
1.5000 g | Freq: Two times a day (BID) | INTRAVENOUS | Status: AC
  Administered 2018-07-12 – 2018-07-14 (×4): 1.5 g via INTRAVENOUS
  Filled 2018-07-12 (×4): qty 1.5

## 2018-07-12 MED ORDER — ROCURONIUM BROMIDE 10 MG/ML (PF) SYRINGE
PREFILLED_SYRINGE | INTRAVENOUS | Status: DC | PRN
  Administered 2018-07-12: 50 mg via INTRAVENOUS

## 2018-07-12 MED ORDER — 0.9 % SODIUM CHLORIDE (POUR BTL) OPTIME
TOPICAL | Status: DC | PRN
  Administered 2018-07-12: 6000 mL

## 2018-07-12 MED ORDER — METOPROLOL TARTRATE 25 MG/10 ML ORAL SUSPENSION: 12.5000 mg | Freq: Two times a day (BID) | ORAL | Status: DC

## 2018-07-12 MED ORDER — MIDAZOLAM HCL 2 MG/2ML IJ SOLN: 2.0000 mg | INTRAMUSCULAR | Status: DC | PRN

## 2018-07-12 MED ORDER — ACETAMINOPHEN 160 MG/5ML PO SOLN: 650.0000 mg | Freq: Once | ORAL | Status: AC

## 2018-07-12 MED ORDER — FENTANYL CITRATE (PF) 250 MCG/5ML IJ SOLN
INTRAMUSCULAR | Status: DC | PRN
  Administered 2018-07-12: 150 ug via INTRAVENOUS
  Administered 2018-07-12: 100 ug via INTRAVENOUS
  Administered 2018-07-12: 150 ug via INTRAVENOUS
  Administered 2018-07-12 (×3): 100 ug via INTRAVENOUS
  Administered 2018-07-12: 200 ug via INTRAVENOUS
  Administered 2018-07-12: 250 ug via INTRAVENOUS
  Administered 2018-07-12: 100 ug via INTRAVENOUS
  Administered 2018-07-12: 200 ug via INTRAVENOUS
  Administered 2018-07-12: 50 ug via INTRAVENOUS

## 2018-07-12 MED ORDER — MIDAZOLAM HCL 5 MG/5ML IJ SOLN
INTRAMUSCULAR | Status: DC | PRN
  Administered 2018-07-12: 2 mg via INTRAVENOUS
  Administered 2018-07-12: 1 mg via INTRAVENOUS
  Administered 2018-07-12: 2 mg via INTRAVENOUS
  Administered 2018-07-12 (×3): 1 mg via INTRAVENOUS
  Administered 2018-07-12: 2 mg via INTRAVENOUS

## 2018-07-12 MED ORDER — ORAL CARE MOUTH RINSE
15.0000 mL | OROMUCOSAL | Status: DC
  Administered 2018-07-12: 15 mL via OROMUCOSAL

## 2018-07-12 MED ORDER — METOPROLOL TARTRATE 12.5 MG HALF TABLET: 12.5000 mg | ORAL_TABLET | Freq: Once | ORAL | Status: DC

## 2018-07-12 MED ORDER — ACETAMINOPHEN 160 MG/5ML PO SOLN: 1000.0000 mg | Freq: Four times a day (QID) | ORAL | Status: DC

## 2018-07-12 MED ORDER — METOPROLOL TARTRATE 5 MG/5ML IV SOLN: 2.5000 mg | INTRAVENOUS | Status: DC | PRN

## 2018-07-12 MED ORDER — ACETAMINOPHEN 500 MG PO TABS
1000.0000 mg | ORAL_TABLET | Freq: Four times a day (QID) | ORAL | Status: DC
  Administered 2018-07-12 – 2018-07-14 (×6): 1000 mg via ORAL
  Filled 2018-07-12 (×6): qty 2

## 2018-07-12 MED ORDER — ALBUMIN HUMAN 5 % IV SOLN
250.0000 mL | INTRAVENOUS | Status: AC | PRN
  Administered 2018-07-12 – 2018-07-13 (×4): 12.5 g via INTRAVENOUS
  Filled 2018-07-12 (×2): qty 250

## 2018-07-12 MED ORDER — POTASSIUM CHLORIDE 10 MEQ/50ML IV SOLN: 10.0000 meq | INTRAVENOUS | Status: AC

## 2018-07-12 MED ORDER — INSULIN REGULAR(HUMAN) IN NACL 100-0.9 UT/100ML-% IV SOLN
INTRAVENOUS | Status: DC
  Administered 2018-07-12: 1.7 [IU]/h via INTRAVENOUS

## 2018-07-12 MED ORDER — LACTATED RINGERS IV SOLN: INTRAVENOUS | Status: DC

## 2018-07-12 MED ORDER — CHLORHEXIDINE GLUCONATE 0.12 % MT SOLN: 15.0000 mL | Freq: Once | OROMUCOSAL | Status: DC

## 2018-07-12 MED ORDER — LACTATED RINGERS IV SOLN
500.0000 mL | Freq: Once | INTRAVENOUS | Status: AC | PRN
  Administered 2018-07-12: 500 mL via INTRAVENOUS

## 2018-07-12 MED ORDER — DOCUSATE SODIUM 100 MG PO CAPS
200.0000 mg | ORAL_CAPSULE | Freq: Every day | ORAL | Status: DC
  Administered 2018-07-13 – 2018-07-14 (×2): 200 mg via ORAL
  Filled 2018-07-12 (×2): qty 2

## 2018-07-12 MED ORDER — VECURONIUM BROMIDE 10 MG IV SOLR
INTRAVENOUS | Status: AC
  Filled 2018-07-12: qty 40

## 2018-07-12 MED ORDER — CHLORHEXIDINE GLUCONATE 0.12% ORAL RINSE (MEDLINE KIT): 15.0000 mL | Freq: Two times a day (BID) | OROMUCOSAL | Status: DC

## 2018-07-12 MED ORDER — MORPHINE SULFATE (PF) 2 MG/ML IV SOLN
1.0000 mg | INTRAVENOUS | Status: AC | PRN
  Administered 2018-07-12: 2 mg via INTRAVENOUS

## 2018-07-12 MED ORDER — ASPIRIN 81 MG PO CHEW: 324.0000 mg | CHEWABLE_TABLET | Freq: Every day | ORAL | Status: DC

## 2018-07-12 MED ORDER — DEXAMETHASONE SODIUM PHOSPHATE 10 MG/ML IJ SOLN
INTRAMUSCULAR | Status: DC | PRN
  Administered 2018-07-12: 4 mg via INTRAVENOUS

## 2018-07-12 MED ORDER — PROTAMINE SULFATE 10 MG/ML IV SOLN
INTRAVENOUS | Status: DC | PRN
  Administered 2018-07-12: 350 mg via INTRAVENOUS

## 2018-07-12 MED ORDER — INSULIN REGULAR BOLUS VIA INFUSION
0.0000 [IU] | Freq: Three times a day (TID) | INTRAVENOUS | Status: DC
  Filled 2018-07-12: qty 10

## 2018-07-12 MED ORDER — NITROGLYCERIN IN D5W 200-5 MCG/ML-% IV SOLN: 0.0000 ug/min | INTRAVENOUS | Status: DC

## 2018-07-12 MED ORDER — PANTOPRAZOLE SODIUM 40 MG PO TBEC
40.0000 mg | DELAYED_RELEASE_TABLET | Freq: Every day | ORAL | Status: DC
  Administered 2018-07-14: 40 mg via ORAL
  Filled 2018-07-12: qty 1

## 2018-07-12 MED ORDER — LIDOCAINE 2% (20 MG/ML) 5 ML SYRINGE
INTRAMUSCULAR | Status: AC
  Filled 2018-07-12: qty 5

## 2018-07-12 MED ORDER — SODIUM CHLORIDE 0.45 % IV SOLN
INTRAVENOUS | Status: DC | PRN
  Administered 2018-07-12: 15:00:00 via INTRAVENOUS

## 2018-07-12 MED ORDER — ASPIRIN EC 325 MG PO TBEC
325.0000 mg | DELAYED_RELEASE_TABLET | Freq: Every day | ORAL | Status: DC
  Administered 2018-07-13 – 2018-07-14 (×2): 325 mg via ORAL
  Filled 2018-07-12 (×2): qty 1

## 2018-07-12 MED ORDER — SODIUM CHLORIDE 0.9% FLUSH: 3.0000 mL | INTRAVENOUS | Status: DC | PRN

## 2018-07-12 MED ORDER — PHENYLEPHRINE HCL-NACL 20-0.9 MG/250ML-% IV SOLN
0.0000 ug/min | INTRAVENOUS | Status: DC
  Administered 2018-07-12: 50 ug/min via INTRAVENOUS
  Administered 2018-07-13 (×2): 70 ug/min via INTRAVENOUS
  Filled 2018-07-12 (×3): qty 250

## 2018-07-12 MED ORDER — SODIUM CHLORIDE 0.9 % IJ SOLN
INTRAMUSCULAR | Status: AC
  Filled 2018-07-12: qty 10

## 2018-07-12 MED ORDER — PROPOFOL 10 MG/ML IV BOLUS
INTRAVENOUS | Status: DC | PRN
  Administered 2018-07-12: 20 mg via INTRAVENOUS

## 2018-07-12 MED ORDER — ARTIFICIAL TEARS OPHTHALMIC OINT
TOPICAL_OINTMENT | OPHTHALMIC | Status: DC | PRN
  Administered 2018-07-12: 1 via OPHTHALMIC

## 2018-07-12 MED ORDER — ONDANSETRON HCL 4 MG/2ML IJ SOLN
4.0000 mg | Freq: Four times a day (QID) | INTRAMUSCULAR | Status: DC | PRN
  Administered 2018-07-13: 4 mg via INTRAVENOUS
  Filled 2018-07-12: qty 2

## 2018-07-12 MED ORDER — ROCURONIUM BROMIDE 50 MG/5ML IV SOSY
PREFILLED_SYRINGE | INTRAVENOUS | Status: AC
  Filled 2018-07-12: qty 5

## 2018-07-12 SURGICAL SUPPLY — 121 items
ADAPTER CARDIO PERF ANTE/RETRO (ADAPTER) ×3 IMPLANT
ADH SKN CLS APL DERMABOND .7 (GAUZE/BANDAGES/DRESSINGS) ×2
ADPR PRFSN 84XANTGRD RTRGD (ADAPTER) ×2
BAG DECANTER FOR FLEXI CONT (MISCELLANEOUS) ×3 IMPLANT
BANDAGE ACE 4X5 VEL STRL LF (GAUZE/BANDAGES/DRESSINGS) ×3 IMPLANT
BANDAGE ACE 6X5 VEL STRL LF (GAUZE/BANDAGES/DRESSINGS) ×3 IMPLANT
BANDAGE ELASTIC 4 VELCRO ST LF (GAUZE/BANDAGES/DRESSINGS) ×1 IMPLANT
BANDAGE ELASTIC 6 VELCRO ST LF (GAUZE/BANDAGES/DRESSINGS) ×1 IMPLANT
BASKET HEART (ORDER IN 25'S) (MISCELLANEOUS) ×1
BASKET HEART (ORDER IN 25S) (MISCELLANEOUS) ×2 IMPLANT
BLADE STERNUM SYSTEM 6 (BLADE) ×3 IMPLANT
BLADE SURG 11 STRL SS (BLADE) ×2 IMPLANT
BLADE SURG 15 STRL LF DISP TIS (BLADE) ×3 IMPLANT
BLADE SURG 15 STRL SS (BLADE) ×6
BNDG GAUZE ELAST 4 BULKY (GAUZE/BANDAGES/DRESSINGS) ×3 IMPLANT
CANISTER SUCT 3000ML PPV (MISCELLANEOUS) ×3 IMPLANT
CANNULA GUNDRY RCSP 15FR (MISCELLANEOUS) ×3 IMPLANT
CATH HEART VENT LEFT (CATHETERS) ×2 IMPLANT
CATH ROBINSON RED A/P 18FR (CATHETERS) ×9 IMPLANT
CATH THORACIC 28FR (CATHETERS) ×3 IMPLANT
CATH THORACIC 36FR (CATHETERS) ×3 IMPLANT
CATH THORACIC 36FR RT ANG (CATHETERS) ×3 IMPLANT
CLIP VESOCCLUDE MED 24/CT (CLIP) IMPLANT
CLIP VESOCCLUDE SM WIDE 24/CT (CLIP) ×2 IMPLANT
CONT SPEC 4OZ CLIKSEAL STRL BL (MISCELLANEOUS) ×3 IMPLANT
COVER SURGICAL LIGHT HANDLE (MISCELLANEOUS) ×3 IMPLANT
COVER WAND RF STERILE (DRAPES) ×3 IMPLANT
CRADLE DONUT ADULT HEAD (MISCELLANEOUS) ×3 IMPLANT
DERMABOND ADVANCED (GAUZE/BANDAGES/DRESSINGS) ×1
DERMABOND ADVANCED .7 DNX12 (GAUZE/BANDAGES/DRESSINGS) IMPLANT
DRAPE CARDIOVASCULAR INCISE (DRAPES) ×3
DRAPE SLUSH/WARMER DISC (DRAPES) ×3 IMPLANT
DRAPE SRG 135X102X78XABS (DRAPES) ×2 IMPLANT
DRSG COVADERM 4X14 (GAUZE/BANDAGES/DRESSINGS) ×3 IMPLANT
ELECT CAUTERY BLADE 6.4 (BLADE) ×3 IMPLANT
ELECT REM PT RETURN 9FT ADLT (ELECTROSURGICAL) ×6
ELECTRODE REM PT RTRN 9FT ADLT (ELECTROSURGICAL) ×4 IMPLANT
FELT TEFLON 1X6 (MISCELLANEOUS) ×6 IMPLANT
GAUZE SPONGE 4X4 12PLY STRL (GAUZE/BANDAGES/DRESSINGS) ×6 IMPLANT
GAUZE SPONGE 4X4 12PLY STRL LF (GAUZE/BANDAGES/DRESSINGS) ×2 IMPLANT
GLOVE BIO SURGEON STRL SZ 6 (GLOVE) ×6 IMPLANT
GLOVE BIO SURGEON STRL SZ 6.5 (GLOVE) ×9 IMPLANT
GLOVE BIO SURGEON STRL SZ7 (GLOVE) IMPLANT
GLOVE BIO SURGEON STRL SZ7.5 (GLOVE) IMPLANT
GLOVE BIOGEL PI IND STRL 6 (GLOVE) IMPLANT
GLOVE BIOGEL PI IND STRL 6.5 (GLOVE) IMPLANT
GLOVE BIOGEL PI IND STRL 7.0 (GLOVE) IMPLANT
GLOVE BIOGEL PI INDICATOR 6 (GLOVE)
GLOVE BIOGEL PI INDICATOR 6.5 (GLOVE)
GLOVE BIOGEL PI INDICATOR 7.0 (GLOVE)
GLOVE EUDERMIC 7 POWDERFREE (GLOVE) ×6 IMPLANT
GLOVE ORTHO TXT STRL SZ7.5 (GLOVE) IMPLANT
GOWN STRL REUS W/ TWL LRG LVL3 (GOWN DISPOSABLE) ×8 IMPLANT
GOWN STRL REUS W/ TWL XL LVL3 (GOWN DISPOSABLE) ×2 IMPLANT
GOWN STRL REUS W/TWL LRG LVL3 (GOWN DISPOSABLE) ×12
GOWN STRL REUS W/TWL XL LVL3 (GOWN DISPOSABLE) ×3
HEMOSTAT POWDER SURGIFOAM 1G (HEMOSTASIS) ×6 IMPLANT
HEMOSTAT SURGICEL 2X14 (HEMOSTASIS) ×3 IMPLANT
INSERT FOGARTY 61MM (MISCELLANEOUS) IMPLANT
INSERT FOGARTY XLG (MISCELLANEOUS) IMPLANT
KIT BASIN OR (CUSTOM PROCEDURE TRAY) ×3 IMPLANT
KIT CATH CPB BARTLE (MISCELLANEOUS) ×3 IMPLANT
KIT SUCTION CATH 14FR (SUCTIONS) ×3 IMPLANT
KIT TURNOVER KIT B (KITS) ×3 IMPLANT
KIT VASOVIEW HEMOPRO 2 VH 4000 (KITS) ×3 IMPLANT
LINE VENT (MISCELLANEOUS) ×2 IMPLANT
NS IRRIG 1000ML POUR BTL (IV SOLUTION) ×18 IMPLANT
PACK E OPEN HEART (SUTURE) ×3 IMPLANT
PACK OPEN HEART (CUSTOM PROCEDURE TRAY) ×3 IMPLANT
PAD ARMBOARD 7.5X6 YLW CONV (MISCELLANEOUS) ×6 IMPLANT
PAD ELECT DEFIB RADIOL ZOLL (MISCELLANEOUS) ×3 IMPLANT
PENCIL BUTTON HOLSTER BLD 10FT (ELECTRODE) ×3 IMPLANT
PUNCH AORTIC ROTATE 4.0MM (MISCELLANEOUS) IMPLANT
PUNCH AORTIC ROTATE 4.5MM 8IN (MISCELLANEOUS) ×3 IMPLANT
PUNCH AORTIC ROTATE 5MM 8IN (MISCELLANEOUS) IMPLANT
SET CARDIOPLEGIA MPS 5001102 (MISCELLANEOUS) ×1 IMPLANT
SPONGE INTESTINAL PEANUT (DISPOSABLE) IMPLANT
SPONGE LAP 18X18 X RAY DECT (DISPOSABLE) IMPLANT
SPONGE LAP 4X18 RFD (DISPOSABLE) ×4 IMPLANT
SUT BONE WAX W31G (SUTURE) ×3 IMPLANT
SUT ETHIBON 2 0 V 52N 30 (SUTURE) ×6 IMPLANT
SUT ETHIBOND 2 0 SH (SUTURE) ×3
SUT ETHIBOND 2 0 SH 36X2 (SUTURE) IMPLANT
SUT ETHIBOND V-5 VALVE (SUTURE) ×6 IMPLANT
SUT MNCRL AB 4-0 PS2 18 (SUTURE) ×2 IMPLANT
SUT PROLENE 3 0 SH DA (SUTURE) IMPLANT
SUT PROLENE 3 0 SH1 36 (SUTURE) ×3 IMPLANT
SUT PROLENE 4 0 RB 1 (SUTURE) ×9
SUT PROLENE 4 0 SH DA (SUTURE) IMPLANT
SUT PROLENE 4-0 RB1 .5 CRCL 36 (SUTURE) ×6 IMPLANT
SUT PROLENE 5 0 C 1 36 (SUTURE) IMPLANT
SUT PROLENE 6 0 C 1 30 (SUTURE) ×1 IMPLANT
SUT PROLENE 7 0 BV 1 (SUTURE) ×1 IMPLANT
SUT PROLENE 7 0 BV1 MDA (SUTURE) ×3 IMPLANT
SUT PROLENE 8 0 BV175 6 (SUTURE) IMPLANT
SUT SILK  1 MH (SUTURE)
SUT SILK 1 MH (SUTURE) IMPLANT
SUT SILK 2 0 SH CR/8 (SUTURE) ×1 IMPLANT
SUT STEEL 6MS V (SUTURE) IMPLANT
SUT STEEL STERNAL CCS#1 18IN (SUTURE) IMPLANT
SUT STEEL SZ 6 DBL 3X14 BALL (SUTURE) ×3 IMPLANT
SUT VIC AB 1 CTX 36 (SUTURE) ×6
SUT VIC AB 1 CTX36XBRD ANBCTR (SUTURE) ×4 IMPLANT
SUT VIC AB 2-0 CT1 27 (SUTURE) ×3
SUT VIC AB 2-0 CT1 TAPERPNT 27 (SUTURE) IMPLANT
SUT VIC AB 2-0 CTX 27 (SUTURE) IMPLANT
SUT VIC AB 3-0 SH 27 (SUTURE)
SUT VIC AB 3-0 SH 27X BRD (SUTURE) IMPLANT
SUT VIC AB 3-0 X1 27 (SUTURE) IMPLANT
SUT VICRYL 4-0 PS2 18IN ABS (SUTURE) IMPLANT
SYSTEM SAHARA CHEST DRAIN ATS (WOUND CARE) ×3 IMPLANT
TAPE CLOTH SURG 4X10 WHT LF (GAUZE/BANDAGES/DRESSINGS) ×1 IMPLANT
TAPE PAPER 2X10 WHT MICROPORE (GAUZE/BANDAGES/DRESSINGS) ×1 IMPLANT
TOWEL GREEN STERILE (TOWEL DISPOSABLE) ×3 IMPLANT
TOWEL GREEN STERILE FF (TOWEL DISPOSABLE) ×3 IMPLANT
TRAY FOLEY SLVR 16FR TEMP STAT (SET/KITS/TRAYS/PACK) ×3 IMPLANT
TUBING INSUFFLATION (TUBING) ×3 IMPLANT
UNDERPAD 30X30 (UNDERPADS AND DIAPERS) ×3 IMPLANT
VALVE AORTIC SZ23 INSP/RESIL (Prosthesis & Implant Heart) ×2 IMPLANT
VENT LEFT HEART 12002 (CATHETERS) ×3
WATER STERILE IRR 1000ML POUR (IV SOLUTION) ×6 IMPLANT

## 2018-07-12 NOTE — Procedures (Signed)
Extubation Procedure Note  Patient Details:   Name: Daniel Mann DOB: December 22, 1950 MRN: [id number]   Airway Documentation:    Vent end date: 07/12/18 Vent end time: 1825   Evaluation  O2 sats: stable throughout Complications: No apparent complications Patient did tolerate procedure well. Bilateral Breath Sounds: Clear, Diminished   Yes   RT extubated patient to 4L [place] with RN at bedside per MD order. Positive cuff leak noted. NIF -30 and vital capacity 1 liter. No stridor noted. Patient is currently sating 97%. RT worked with patient on IS and patient obtained with good effort. RT will continue to monitor as needed.    Lura Em 07/12/2018, 6:37 PM

## 2018-07-12 NOTE — Op Note (Signed)
CARDIOVASCULAR SURGERY OPERATIVE NOTE  07/12/2018  Surgeon:  [name], MD  First Assistant: Doree Fudge,  PA-C   Preoperative Diagnosis:  Severe multi-vessel coronary artery disease and severe aortic stenosis.   Postoperative Diagnosis:  Same   Procedure:  1. Median Sternotomy 2. Extracorporeal circulation 3.   Coronary artery bypass grafting x 3   Left internal mammary graft to the LAD  SVG to OM2  SVG to RCA  4.   Endoscopic vein harvest from the right leg 5.   Aortic valve replacement with a 23 mm Edwards INSPIRIS RESILIA pericardial valve.   Anesthesia:  General Endotracheal   Clinical History/Surgical Indication:  This 67 year old gentleman has stage D, severe, symptomatic aortic stenosis with [place] Heart Association class II symptoms of exertional shortness of breath and chest discomfort consistent with chronic diastolic congestive heart failure. His cardiac catheterization shows severe three-vessel coronary disease in addition to severe aortic stenosis. His echocardiogram shows a trileaflet aortic valve with severely thickened and calcified leaflets with restricted mobility. The mean gradient is 41 mmHg consistent with severe aortic stenosis. Left ventricular ejection fraction is 60 to 65% with a focal basal septal bulge. I think the best treatment for him is open surgical aortic valve replacement and coronary bypass graft surgery. I would recommend using a bioprosthetic bovine pericardial prosthesis which has excellent long-term durability in his age group. I discussed the alternative of a mechanical valve but I do not think there is any indication for that in a 67 year old patient due to the 1 to 2 %/year risk of thromboembolism and 1 to 2% risk per year of hemorrhagic complications even if Coumadin is taken and managed correctly. I would evaluate the focal basal  septal hypertrophy in the operating room with TEE and excised that if it appears to be causing any obstruction. I reviewed the cardiac catheterization and echo findings with the patient and his son who is a nurse on 4 E and answered their questions.I discussed the operative procedure with the patient and his son including alternatives, benefits and risks; including but not limited to bleeding, blood transfusion, infection, stroke, myocardial infarction, graft failure, heart block requiring a permanent pacemaker, organ dysfunction, and death. Daniel Mann understands and agrees to proceed.   Preparation:  The patient was seen in the preoperative holding area and the correct patient, correct operation were confirmed with the patient after reviewing the medical record and catheterization. The consent was signed by me. Preoperative antibiotics were given. A pulmonary arterial line and radial arterial line were placed by the anesthesia team. The patient was taken back to the operating room and positioned supine on the operating room table. After being placed under general endotracheal anesthesia by the anesthesia team a foley catheter was placed. The neck, chest, abdomen, and both legs were prepped with betadine soap and solution and draped in the usual sterile manner. A surgical time-out was taken and the correct patient and operative procedure were confirmed with the nursing and anesthesia staff.   Cardiopulmonary Bypass:  A median sternotomy was performed. The pericardium was opened in the midline. Right ventricular function appeared normal. The ascending aorta was of normal size and had no palpable plaque. There were no contraindications to aortic cannulation or cross-clamping. The patient was fully systemically heparinized and the ACT was maintained > 400 sec. The proximal aortic arch was cannulated with a 20 F aortic cannula for arterial inflow. Venous cannulation was performed via the right atrial  appendage using a two-staged  venous cannula. An antegrade cardioplegia/vent cannula was inserted into the mid-ascending aorta. A retrograde cardioplegia cannula was placed into the coronary sinus via the right atrium.  A left ventricular vent was placed via the right superior pulmonary vein. Aortic occlusion was performed with a single cross-clamp. Systemic cooling to 32 degrees Centigrade and topical cooling of the heart with iced saline were used. Hyperkalemic antegrade cold blood cardioplegia was used to induce diastolic arrest and then cold blood retrograde cardioplegia was given at about 20 minute intervals throughout the period of arrest to maintain myocardial temperature at or below 10 degrees centigrade. A temperature probe was inserted into the interventricular septum and an insulating pad was placed in the pericardium.   Left internal mammary harvest:  The left side of the sternum was retracted using the Rultract retractor. The left internal mammary artery was harvested as a pedicle graft. All side branches were clipped. It was a medium-sized vessel of good quality with excellent blood flow. It was ligated distally and divided. It was sprayed with topical papaverine solution to prevent vasospasm.   Endoscopic vein harvest:  The right greater saphenous vein was harvested endoscopically through a 2 cm incision medial to the right knee. It was harvested from the upper thigh to below the knee. It was a large-sized vein of good quality. The side branches were all ligated with 4-0 silk ties.    Coronary arteries:  The coronary arteries were examined.   LAD:  Large vessel that was intramyocardial in its proximal and mid portions but visible in the distal third.  LCX: Large OM1 with no visible disease.  The OM 2 was a large vessel with no distal disease.  RCA: Medium caliber vessel that was located in the midportion where it was suitable for grafting.   Grafts:  1. LIMA to the LAD: 2.0  mm. It was sewn end to side using 8-0 prolene continuous suture. 2. SVG to OM2:  1.75 mm. It was sewn end to side using 7-0 prolene continuous suture. 3. SVG to RCA:  1.75 mm. It was sewn end to side using 7-0 prolene continuous suture.   The proximal vein graft anastomoses were performed to the mid-ascending aorta using continuous 6-0 prolene suture. Graft markers were placed around the proximal anastomoses.  Aortic Valve Replacement:  A transverse aortotomy was performed 1 cm above the take-off of the right coronary artery. The native valve was tricuspid with calcified leaflets and moderate annular calcification. The ostia of the coronary arteries were in normal position and were not obstructed.  There was mild focal basal septal hypertrophy that did not appear to be obstructing the left ventricular outflow tract and did not require resection. The native valve leaflets were excised and the annulus was decalcified with rongeurs. Care was taken to remove all particulate debris. The left ventricle was directly inspected for debris and then irrigated with ice saline solution. The annulus was sized and a size 23 mm Edwards INSPIRIS RESILIA pericardial valve was chosen. The model number was [id number] and the serial number was [id number]. BSA was 2.24. While the valve was being prepared 2-0 Ethibond pledgeted horizontal mattress sutures were placed around the annulus with the pledgets in a sub-annular position. The sutures were placed through the sewing ring and the valve lowered into place. The sutures were tied sequentially. The valve seated nicely and the coronary ostia were not obstructed. The prosthetic valve leaflets moved normally and there was no sub-valvular obstruction. The aortotomy was closed using 4-0  Prolene suture in 2 layers with felt strips to reinforce the closure.  Completion:  The patient was rewarmed to 37 degrees Centigrade. The clamp was removed from the LIMA pedicle and there was rapid  warming of the septum and return of ventricular fibrillation. The crossclamp was removed with a time of 141 minutes. There was spontaneous return of sinus rhythm. The distal and proximal anastomoses were checked for hemostasis. The position of the grafts was satisfactory. Two temporary epicardial pacing wires were placed on the right atrium and two on the right ventricle. The patient was weaned from CPB without difficulty on no inotropes. CPB time was 169 minutes. Cardiac output was 8 LPM. Heparin was fully reversed with protamine and the aortic and venous cannulas removed. Hemostasis was achieved. Mediastinal and left pleural drainage tubes were placed. The sternum was closed with double #6 stainless steel wires. The fascia was closed with continuous # 1 vicryl suture. The subcutaneous tissue was closed with 2-0 vicryl continuous suture. The skin was closed with 3-0 vicryl subcuticular suture. All sponge, needle, and instrument counts were reported correct at the end of the case. Dry sterile dressings were placed over the incisions and around the chest tubes which were connected to pleurevac suction. The patient was then transported to the surgical intensive care unit in stable condition.

## 2018-07-12 NOTE — Interval H&P Note (Signed)
History and Physical Interval Note:  07/12/2018 6:29 AM  Daniel Mann  has presented today for surgery, with the diagnosis of CAD AS  The various methods of treatment have been discussed with the patient and family. After consideration of risks, benefits and other options for treatment, the patient has consented to  Procedure(s): AORTIC VALVE REPLACEMENT (AVR) (N/A) CORONARY ARTERY BYPASS GRAFTING (CABG) (N/A) TRANSESOPHAGEAL ECHOCARDIOGRAM (TEE) (N/A) as a surgical intervention .  The patient's history has been reviewed, patient examined, no change in status, stable for surgery.  I have reviewed the patient's chart and labs.  Questions were answered to the patient's satisfaction.     [name]

## 2018-07-12 NOTE — Anesthesia Procedure Notes (Addendum)
Procedure Name: Intubation Date/Time: 07/12/2018 7:41 AM Performed by: Jearld Pies, CRNA Pre-anesthesia Checklist: Patient identified, Emergency Drugs available, Suction available and Patient being monitored Patient Re-evaluated:Patient Re-evaluated prior to induction Oxygen Delivery Method: Circle System Utilized Preoxygenation: Pre-oxygenation with 100% oxygen Induction Type: IV induction Ventilation: Oral airway inserted - appropriate to patient size and Two handed mask ventilation required Laryngoscope Size: Mac and 4 Grade View: Grade II Tube type: Oral Tube size: 8.0 mm Number of attempts: 1 Airway Equipment and Method: Stylet and Oral airway Placement Confirmation: ETT inserted through vocal cords under direct vision,  positive ETCO2 and breath sounds checked- equal and bilateral Secured at: 23 cm Tube secured with: Tape Dental Injury: Teeth and Oropharynx as per pre-operative assessment

## 2018-07-12 NOTE — Anesthesia Procedure Notes (Signed)
Central Venous Catheter Insertion Performed by: Roderic Palau, MD, anesthesiologist Start/End10/31/2019 6:45 AM, 07/12/2018 7:00 AM Patient location: Pre-op. Preanesthetic checklist: patient identified, IV checked, site marked, risks and benefits discussed, surgical consent, monitors and equipment checked, pre-op evaluation, timeout performed and anesthesia consent Position: Trendelenburg Lidocaine 1% used for infiltration and patient sedated Hand hygiene performed , maximum sterile barriers used  and Seldinger technique used Catheter size: 9 Fr Total catheter length 10. Central line was placed.MAC introducer Swan type:thermodilution Procedure performed using ultrasound guided technique. Ultrasound Notes:anatomy identified, needle tip was noted to be adjacent to the nerve/plexus identified, no ultrasound evidence of intravascular and/or intraneural injection and image(s) printed for medical record Attempts: 1 Following insertion, line sutured, dressing applied and Biopatch. Post procedure assessment: blood return through all ports, free fluid flow and no air  Patient tolerated the procedure well with no immediate complications.

## 2018-07-12 NOTE — Brief Op Note (Signed)
07/12/2018  12:18 PM  PATIENT:  Daniel Mann  67 y.o. male  PRE-OPERATIVE DIAGNOSIS:  1. CAD 2. Severe AS  POST-OPERATIVE DIAGNOSIS:  1. CAD 2. Severe AS  PROCEDURE: TRANSESOPHAGEAL ECHOCARDIOGRAM (TEE), MEDIAN STERNOTOMY for CORONARY ARTERY BYPASS GRAFTING (CABG) times 3 (LIMA to LAD, SVG to CIRCUMFLEX, SVG to RCA) using left internal mammary artery and right greater saphenous vein harvested endoscopically and AORTIC VALVE REPLACEMENT (AVR) (using a prosthetic Inspiris Valve, Model #11500A, Serial # [id number],) size  23   SURGEON:  Surgeon(s) and Role:    Bartle, Payton Doughty, MD - Primary  PHYSICIAN ASSISTANT: Doree Fudge PA-C  ANESTHESIA:   general  EBL:  70 mL   DRAINS: Chest tubes placed in the mediastinal and pleural spaces   SPECIMEN:  Source of Specimen:  Native AV leaflets  DISPOSITION OF SPECIMEN:  PATHOLOGY  COUNTS CORRECT:  YES  DICTATION: .Dragon Dictation  PLAN OF CARE: Admit to inpatient   PATIENT DISPOSITION:  ICU - intubated and hemodynamically stable.   Delay start of Pharmacological VTE agent (>24hrs) due to surgical blood loss or risk of bleeding: yes  BASELINE WEIGHT: 107.7 kg

## 2018-07-12 NOTE — Anesthesia Procedure Notes (Signed)
Arterial Line Insertion Start/End10/31/2019 6:55 AM, 07/12/2018 7:04 AM Performed by: Zollie Scale, CRNA, CRNA  Patient location: Pre-op. Preanesthetic checklist: patient identified, IV checked, site marked, risks and benefits discussed, surgical consent, monitors and equipment checked, pre-op evaluation, timeout performed and anesthesia consent Lidocaine 1% used for infiltration Left, radial was placed Catheter size: 20 G Hand hygiene performed , maximum sterile barriers used  and Seldinger technique used Allen's test indicative of satisfactory collateral circulation Attempts: 1 Procedure performed without using ultrasound guided technique. Following insertion, dressing applied and Biopatch. Post procedure assessment: normal  Patient tolerated the procedure well with no immediate complications.

## 2018-07-12 NOTE — Progress Notes (Signed)
      [address]       [place] [address]             [phone]      S/p CABG  Extubated, alert  BP (!) 83/60   Pulse 97   Temp 98.6 F (37 C)   Resp (!) 22   SpO2 (!) 89%   Intake/Output Summary (Last 24 hours) at 07/12/2018 1848 Last data filed at 07/12/2018 1800 Gross per 24 hour  Intake 4862.75 ml  Output 2420 ml  Net 2442.75 ml   Ci= 2.1 Doing well early postop  Viviann Spare C. Dorris Fetch, MD [facility] [phone]

## 2018-07-12 NOTE — Anesthesia Procedure Notes (Signed)
Central Venous Catheter Insertion Performed by: Gaynelle Adu, MD, anesthesiologist Start/End10/31/2019 6:45 AM, 07/12/2018 7:00 AM Patient location: Pre-op. Preanesthetic checklist: patient identified, IV checked, site marked, risks and benefits discussed, surgical consent, monitors and equipment checked, pre-op evaluation, timeout performed and anesthesia consent Hand hygiene performed  and maximum sterile barriers used  PA cath was placed.Swan type:thermodilution PA Cath depth:50 Procedure performed without using ultrasound guided technique. Attempts: 1 Patient tolerated the procedure well with no immediate complications.

## 2018-07-12 NOTE — Transfer of Care (Signed)
Immediate Anesthesia Transfer of Care Note  Patient: Daniel Mann  Procedure(s) Performed: AORTIC VALVE REPLACEMENT (AVR) using an Inspiris Valve size  23 (N/A Chest) CORONARY ARTERY BYPASS GRAFTING (CABG) times three using left internal mammary artery and right greater saphenous vein harvested endoscopically. (N/A Chest) TRANSESOPHAGEAL ECHOCARDIOGRAM (TEE) (N/A )  Patient Location: ICU  Anesthesia Type:General  Level of Consciousness: sedated and Patient remains intubated per anesthesia plan - Precedex infusion continued.  Airway & Oxygen Therapy: Patient remains intubated per anesthesia plan and Patient placed on Ventilator (see vital sign flow sheet for setting)   SIMV 12/5 100% FiO2 settings placed via RT at bedside, ventilation verified.  Post-op Assessment: Report given to RN and Post -op Vital signs reviewed and stable  Report to [facility] RN in Cardiac ICU  Post vital signs: Reviewed and stable  Last Vitals:  Vitals Value Taken Time  BP 104/57 (ABP)   Temp 35.6 (Swan Ganz)   Pulse 80 (DDD)   Resp 16   SpO2 98     Last Pain:  Vitals:   07/12/18 0602  TempSrc: Oral         Complications: No apparent anesthesia complications

## 2018-07-12 NOTE — Progress Notes (Signed)
  Echocardiogram Echocardiogram Transesophageal has been performed.  Gerda Diss 07/12/2018, 9:06 AM

## 2018-07-13 ENCOUNTER — Inpatient Hospital Stay (HOSPITAL_COMMUNITY): Payer: BLUE CROSS/BLUE SHIELD

## 2018-07-13 ENCOUNTER — Encounter (HOSPITAL_COMMUNITY): Payer: Self-pay | Admitting: Surgery

## 2018-07-13 LAB — POCT I-STAT, CHEM 8
BUN: 12 mg/dL (ref 8–23)
CREATININE: 0.7 mg/dL (ref 0.61–1.24)
Calcium, Ion: 1.43 mmol/L — ABNORMAL HIGH (ref 1.15–1.40)
Chloride: 99 mmol/L (ref 98–111)
GLUCOSE: 167 mg/dL — AB (ref 70–99)
HEMATOCRIT: 26 % — AB (ref 39.0–52.0)
Hemoglobin: 8.8 g/dL — ABNORMAL LOW (ref 13.0–17.0)
POTASSIUM: 4.7 mmol/L (ref 3.5–5.1)
Sodium: 135 mmol/L (ref 135–145)
TCO2: 27 mmol/L (ref 22–32)

## 2018-07-13 LAB — GLUCOSE, CAPILLARY
GLUCOSE-CAPILLARY: 108 mg/dL — AB (ref 70–99)
GLUCOSE-CAPILLARY: 100 mg/dL — AB (ref 70–99)
GLUCOSE-CAPILLARY: 98 mg/dL (ref 70–99)
GLUCOSE-CAPILLARY: 151 mg/dL — AB (ref 70–99)
Glucose-Capillary: 100 mg/dL — ABNORMAL HIGH (ref 70–99)
Glucose-Capillary: 105 mg/dL — ABNORMAL HIGH (ref 70–99)
Glucose-Capillary: 107 mg/dL — ABNORMAL HIGH (ref 70–99)
Glucose-Capillary: 114 mg/dL — ABNORMAL HIGH (ref 70–99)
Glucose-Capillary: 135 mg/dL — ABNORMAL HIGH (ref 70–99)
Glucose-Capillary: 143 mg/dL — ABNORMAL HIGH (ref 70–99)
Glucose-Capillary: 151 mg/dL — ABNORMAL HIGH (ref 70–99)

## 2018-07-13 LAB — CBC
HEMATOCRIT: 30.2 % — AB (ref 39.0–52.0)
HEMATOCRIT: 28 % — AB (ref 39.0–52.0)
HEMOGLOBIN: 10.1 g/dL — AB (ref 13.0–17.0)
Hemoglobin: 9.3 g/dL — ABNORMAL LOW (ref 13.0–17.0)
MCH: 31.6 pg (ref 26.0–34.0)
MCH: 31.8 pg (ref 26.0–34.0)
MCHC: 33.4 g/dL (ref 30.0–36.0)
MCHC: 33.2 g/dL (ref 30.0–36.0)
MCV: 94.4 fL (ref 80.0–100.0)
MCV: 95.9 fL (ref 80.0–100.0)
Platelets: 180 10*3/uL (ref 150–400)
Platelets: 143 10*3/uL — ABNORMAL LOW (ref 150–400)
RBC: 3.2 MIL/uL — ABNORMAL LOW (ref 4.22–5.81)
RBC: 2.92 MIL/uL — ABNORMAL LOW (ref 4.22–5.81)
RDW: 12.5 % (ref 11.5–15.5)
RDW: 12.8 % (ref 11.5–15.5)
WBC: 13.8 10*3/uL — ABNORMAL HIGH (ref 4.0–10.5)
WBC: 12.1 10*3/uL — AB (ref 4.0–10.5)
nRBC: 0 % (ref 0.0–0.2)
nRBC: 0 % (ref 0.0–0.2)

## 2018-07-13 LAB — POCT I-STAT 4, (NA,K, GLUC, HGB,HCT)
Glucose, Bld: 102 mg/dL — ABNORMAL HIGH (ref 70–99)
HEMATOCRIT: 33 % — AB (ref 39.0–52.0)
HEMOGLOBIN: 11.2 g/dL — AB (ref 13.0–17.0)
Potassium: 4 mmol/L (ref 3.5–5.1)
SODIUM: 141 mmol/L (ref 135–145)

## 2018-07-13 LAB — BASIC METABOLIC PANEL
Anion gap: 3 — ABNORMAL LOW (ref 5–15)
BUN: 10 mg/dL (ref 8–23)
CHLORIDE: 108 mmol/L (ref 98–111)
CO2: 24 mmol/L (ref 22–32)
CREATININE: 0.6 mg/dL — AB (ref 0.61–1.24)
Calcium: 8.7 mg/dL — ABNORMAL LOW (ref 8.9–10.3)
GFR calc Af Amer: >60 mL/min (ref >60)
GFR calc non Af Amer: >60 mL/min (ref >60)
GLUCOSE: 109 mg/dL — AB (ref 70–99)
Potassium: 4.5 mmol/L (ref 3.5–5.1)
Sodium: 135 mmol/L (ref 135–145)

## 2018-07-13 LAB — CREATININE, SERUM: CREATININE: 0.75 mg/dL (ref 0.61–1.24)

## 2018-07-13 LAB — MAGNESIUM
Magnesium: 2.5 mg/dL — ABNORMAL HIGH (ref 1.7–2.4)
Magnesium: 2.3 mg/dL (ref 1.7–2.4)

## 2018-07-13 MED ORDER — TRAMADOL HCL 50 MG PO TABS
50.0000 mg | ORAL_TABLET | ORAL | Status: DC | PRN
  Administered 2018-07-13 – 2018-07-14 (×2): 50 mg via ORAL
  Filled 2018-07-13 (×2): qty 1

## 2018-07-13 MED ORDER — ENOXAPARIN SODIUM 40 MG/0.4ML ~~LOC~~ SOLN
40.0000 mg | Freq: Every day | SUBCUTANEOUS | Status: DC
  Administered 2018-07-13 – 2018-07-15 (×3): 40 mg via SUBCUTANEOUS
  Filled 2018-07-13 (×3): qty 0.4

## 2018-07-13 MED ORDER — INSULIN ASPART 100 UNIT/ML ~~LOC~~ SOLN: 0.0000 [IU] | SUBCUTANEOUS | Status: DC

## 2018-07-13 MED FILL — Heparin Sodium (Porcine) Inj 1000 Unit/ML: INTRAMUSCULAR | Qty: 20 | Status: AC

## 2018-07-13 MED FILL — Sodium Bicarbonate IV Soln 8.4%: INTRAVENOUS | Qty: 50 | Status: AC

## 2018-07-13 MED FILL — Lidocaine HCl Local Preservative Free (PF) Inj 1%: INTRAMUSCULAR | Qty: 2 | Status: AC

## 2018-07-13 MED FILL — Mannitol IV Soln 20%: INTRAVENOUS | Qty: 500 | Status: AC

## 2018-07-13 MED FILL — Electrolyte-R (PH 7.4) Solution: INTRAVENOUS | Qty: 3000 | Status: AC

## 2018-07-13 MED FILL — Sodium Chloride IV Soln 0.9%: INTRAVENOUS | Qty: 2000 | Status: AC

## 2018-07-13 NOTE — Anesthesia Postprocedure Evaluation (Signed)
Anesthesia Post Note  Patient: Daniel Mann  Procedure(s) Performed: AORTIC VALVE REPLACEMENT (AVR) using an Inspiris Valve size  23 (N/A Chest) CORONARY ARTERY BYPASS GRAFTING (CABG) times three using left internal mammary artery and right greater saphenous vein harvested endoscopically. (N/A Chest) TRANSESOPHAGEAL ECHOCARDIOGRAM (TEE) (N/A )     Patient location during evaluation: ICU Anesthesia Type: General Level of consciousness: awake and alert Pain management: satisfactory to patient Vital Signs Assessment: post-procedure vital signs reviewed and stable Respiratory status: spontaneous breathing, nonlabored ventilation, respiratory function stable and patient connected to nasal cannula oxygen Cardiovascular status: blood pressure returned to baseline and stable Postop Assessment: no apparent nausea or vomiting Anesthetic complications: no Comments: Extubated POD#0, doing well.    Last Vitals:  Vitals:   07/13/18 0645 07/13/18 0700  BP:    Pulse: 79 79  Resp: 12 (!) 6  Temp: 37.4 C 37.4 C  SpO2: 97% 94%    Last Pain:  Vitals:   07/13/18 0835  TempSrc:   PainSc: Asleep                 Beryle Lathe

## 2018-07-13 NOTE — Progress Notes (Signed)
EKG CRITICAL VALUE     12 lead EKG performed.  Critical value noted.  Kennith Center, RN notified.   Destyn Parfitt H, CCT 07/13/2018 7:22 AM

## 2018-07-13 NOTE — Discharge Instructions (Signed)

## 2018-07-13 NOTE — Progress Notes (Signed)
1 Day Post-Op Procedure(s) (LRB): AORTIC VALVE REPLACEMENT (AVR) using an Inspiris Valve size  23 (N/A) CORONARY ARTERY BYPASS GRAFTING (CABG) times three using left internal mammary artery and right greater saphenous vein harvested endoscopically. (N/A) TRANSESOPHAGEAL ECHOCARDIOGRAM (TEE) (N/A) Subjective: Sore but feels well otherwise   Objective: Vital signs in last 24 hours: Temp:  [95.9 F (35.5 C)-99.7 F (37.6 C)] 99.3 F (37.4 C) (11/01 0700) Pulse Rate:  [75-98] 79 (11/01 0700) Cardiac Rhythm: Normal sinus rhythm (11/01 0400) Resp:  [6-59] 6 (11/01 0700) BP: (83-110)/(56-79) 101/60 (11/01 0500) SpO2:  [89 %-99 %] 94 % (11/01 0700) Arterial Line BP: (76-131)/(35-65) 117/51 (11/01 0700) FiO2 (%):  [40 %-50 %] 40 % (10/31 1750) Weight:  [107.4 kg] 107.4 kg (11/01 0500)  Hemodynamic parameters for last 24 hours: PAP: (20-36)/(7-17) 33/13 CO:  [3.4 L/min-6.1 L/min] 6.1 L/min CI:  [1.5 L/min/m2-2.7 L/min/m2] 2.7 L/min/m2  Intake/Output from previous day: 10/31 0701 - 11/01 0700 In: 9159.7 [P.O.:90; I.V.:5268.6; Blood:600; IV Piggyback:3201.1] Out: 3690 [Urine:2220; Blood:900; Chest Tube:570] Intake/Output this shift: No intake/output data recorded.  General appearance: alert and cooperative Neurologic: intact Heart: regular rate and rhythm, S1, S2 normal, no murmur, click, rub or gallop Lungs: clear to auscultation bilaterally Extremities: edema mild Wound: dressings dry  Lab Results: Recent Labs    07/12/18 1432 07/12/18 2106 07/13/18 0412  WBC 17.2*  --  13.8*  HGB 11.7* 9.9* 10.1*  HCT 36.1* 29.0* 30.2*  PLT 188  --  180   BMET:  Recent Labs    07/10/18 0905  07/12/18 2106 07/13/18 0412  NA 136   < > 137 135  K 4.3   < > 4.7 4.5  CL 108   < > 105 108  CO2 21*  --   --  24  GLUCOSE 113*   < > 160* 109*  BUN 16   < > 12 10  CREATININE 0.65   < > 0.50* 0.60*  CALCIUM 10.3  --   --  8.7*   < > = values in this interval not displayed.    PT/INR:   Recent Labs    07/12/18 1432  LABPROT 17.8*  INR 1.48   ABG    Component Value Date/Time   PHART 7.311 (L) 07/12/2018 1852   HCO3 22.8 07/12/2018 1852   TCO2 24 07/12/2018 2106   ACIDBASEDEF 3.0 (H) 07/12/2018 1852   O2SAT 96.0 07/12/2018 1852   CBG (last 3)  Recent Labs    07/13/18 0322 07/13/18 0400 07/13/18 0520  GLUCAP 100* 100* 98   CXR: ok  ECG: sinus, mild diffuse ST elevation suggests pericarditis.  Assessment/Plan: S/P Procedure(s) (LRB): AORTIC VALVE REPLACEMENT (AVR) using an Inspiris Valve size  23 (N/A) CORONARY ARTERY BYPASS GRAFTING (CABG) times three using left internal mammary artery and right greater saphenous vein harvested endoscopically. (N/A) TRANSESOPHAGEAL ECHOCARDIOGRAM (TEE) (N/A)  POD 1 Hemodynamically stable but still on neo. Wean as tolerated. Hold beta blocker until stable off neo.  DC chest tubes, swan DC arterial line when neo off.  Hold on diuresis until off neo  OOB, IS, ambulation.  Preop Hgb A1c was 5.4 and CBG's normal. DC insulin.   LOS: 1 day    [name] 07/13/2018

## 2018-07-13 NOTE — Discharge Summary (Signed)
Physician Discharge Summary       [address]       [place] [address]             [phone]    Patient ID: Daniel Mann MRN: [id number] DOB/AGE: 68-Apr-1952 68 y.o.  Admit date: 07/12/2018 Discharge date: 07/16/2018  Admission Diagnoses: 1. Coronary artery disease 2. Severe aortic stenosis  Discharge Diagnoses:  1. S/P CABG x 3 2. S/P aortic valve replacement with bioprosthetic valve 3. ABL anemia 4. History of Hyperlipidemia 5. History of hypertension 6. History of Vitamin D deficiency 7. History of Hernia, inguinal, right 8. History of GERD (gastroesophageal reflux disease) 9. History of Impaired fasting glucose  Procedure (s):  1. Median Sternotomy 2. Extracorporeal circulation 3.   Coronary artery bypass grafting x 3   Left internal mammary graft to the LAD  SVG to OM2  SVG to RCA  4.   Endoscopic vein harvest from the right leg 5.   Aortic valve replacement with a 23 mm Edwards INSPIRIS RESILIA pericardial valve by Dr. Laneta Simmers on 07/12/2018.  History of Presenting Illness: The patient is a 67 year old gentleman with history of hypertension, hyperlipidemia, intolerance to statins, and aortic stenosis who has been followed by Dr. Eden Emms. He had a 2D echocardiogram in April 2019 which showed a mean gradient of 38 mmHg and a peak gradient of 85 mmHg with a dimensionless index of 0.31. He had a nuclear stress test at that time there was a low risk study with ejection fraction of 64% and no significant reversible ischemia. The patient reports that over this past several months he has had some chest discomfort and shortness of breath with exertion such as walking briskly, walking up hills, or using the treadmill at the gym. He has had no symptoms with normal daily activity. He has had occasional mild dizziness but no syncope. He denies orthopnea and PND. He said no peripheral edema. He had a follow-up echocardiogram on 06/19/2018 which showed a mean  gradient of 41 mmHg with a peak of 103 mmHg. The dimensionless index was 0.25. Left ventricular ejection fraction was 60 to 65%. Cardiac catheterization was performed on 07/02/2018. This confirmed severe aortic stenosis with a mean gradient of 33 mmHg and a peak to peak gradient of 51 mmHg. Coronary angiography showed severe three-vessel disease with total occlusion of the proximal codominant right coronary artery with collaterals filling the distal vessel. The distal left circumflex had a large marginal with 50 to 70% narrowing. The LAD had 75% proximal stenosis.   The patient is here today with his son who is a Engineer, civil (consulting) on [employer]. 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   This 67 year old gentleman has stage D, severe, symptomatic aortic stenosis with [place] Heart Association class II symptoms of exertional shortness of breath and chest discomfort consistent with chronic diastolic congestive heart failure. His cardiac catheterization shows severe three-vessel coronary disease in addition to severe aortic stenosis. His echocardiogram shows a trileaflet aortic valve with severely thickened and calcified leaflets with restricted mobility. The mean gradient is 41 mmHg consistent with severe aortic stenosis. Left ventricular ejection fraction is 60 to 65% with a focal basal septal bulge. Dr. Laneta Simmers thought the best treatment for him is open surgical aortic valve replacement and coronary bypass graft surgery. I would recommend using a bioprosthetic bovine pericardial prosthesis which has excellent long-term durability in his age group. Dr. Laneta Simmers discussed the alternative of a mechanical  valve but I do not think there is any indication for that in a 67 year old patient due to the 1 to 2 %/year risk of thromboembolism and 1 to 2% risk per year of hemorrhagic complications even if Coumadin is taken and managed correctly. Dr. Laneta Simmers would evaluate the focal basal septal hypertrophy in the operating  room with TEE and excised that if it appears to be causing any obstruction. I reviewed the cardiac catheterization and echo findings with the patient and his son. Potential risks, benefits, and complications of the surgery were discussed with the patient and he agreed to proceed with surgery. He underwent a CABG x 3 and AVR on 07/12/2018.  Brief Hospital Course:  The patient was extubated the evening of surgery without difficulty. He remained afebrile and hemodynamically stable. Theone Murdoch, a line, chest tubes, and foley were removed early in the post operative course. Lopressor was started and titrated accordingly. He was volume over loaded and diuresed. He had ABL anemia. He did not require a post op transfusion. Last H and H was 8.3/26.7. He was weaned off the insulin drip. The patient's HGA1C pre op was 5.4. The patient was felt surgically stable for transfer from the ICU to [facility]U for further convalescence on 07/14/2018. He continues to progress with cardiac rehab. He was ambulating on room air. He has been tolerating a diet and has had a bowel movement. Epicardial pacing wires were removed on 07/15/2018. Chest tube sutures will be removed the day of discharge. The patient is felt surgically stable for discharge today.   Latest Vital Signs: Blood pressure 117/70, pulse 93, temperature 98.5 F (36.9 C), temperature source Oral, resp. rate 16, height 6' (1.829 m), weight 103.8 kg, SpO2 97 %.  Physical Exam:  General appearance: alert, cooperative and no distress Heart: regular rate and rhythm Lungs: diminished breath sounds bibasilar Abdomen: soft, non-tender; bowel sounds normal; no masses,  no organomegaly Extremities: edema pitting Wound: clean and dry  Discharge Condition: Stable and discharged to home  Recent laboratory studies:  Lab Results  Component Value Date   WBC 12.4 (H) 07/15/2018   HGB 8.3 (L) 07/15/2018   HCT 26.7 (L) 07/15/2018   MCV 97.1 07/15/2018   PLT 149 (L) 07/15/2018    Lab Results  Component Value Date   NA 134 (L) 07/15/2018   K 4.2 07/15/2018   CL 101 07/15/2018   CO2 29 07/15/2018   CREATININE 0.61 07/15/2018   GLUCOSE 124 (H) 07/15/2018      Diagnostic Studies: Dg Chest 2 View  Result Date: 07/16/2018 CLINICAL DATA:  Follow-up left pleural effusion and atelectasis. EXAM: CHEST - 2 VIEW COMPARISON:  Portable chest x-ray of July 14, 2018 FINDINGS: The lungs are slightly better inflated today. There remain small bilateral pleural effusions greatest on the right. There is left lower lobe atelectasis or pneumonia which has improved somewhat. The heart is top-normal in size. A prosthetic aortic valve is present. The sternal wires are intact. The pulmonary vascularity is not engorged. There is calcification in the wall of the aortic arch. The right internal jugular Cordis sheath has been removed. IMPRESSION: Improving left basilar atelectasis or pneumonia. Persistent small bilateral pleural effusions greatest on the right. No pulmonary edema. Thoracic aortic atherosclerosis. Electronically Signed   By: David  [place] M.D.   On: 07/16/2018 07:08   Dg Chest 2 View  Result Date: 07/10/2018 CLINICAL DATA:  Preop EXAM: CHEST - 2 VIEW COMPARISON:  05/28/2015 FINDINGS: Normal heart size. Lungs clear. No  pneumothorax. No pleural effusion. IMPRESSION: No active cardiopulmonary disease. Electronically Signed   By: Jolaine Click M.D.   On: 07/10/2018 11:54   Dg Chest Port 1 View  Result Date: 07/14/2018 CLINICAL DATA:  Aortic valve replacement 2 days ago. EXAM: PORTABLE CHEST 1 VIEW COMPARISON:  One-view chest x-ray 07/13/2018 FINDINGS: The Swan-Ganz catheter was removed. A right IJ sheath remains. Mediastinal drain and left-sided chest tube are removed. There is no significant pneumothorax or pneumomediastinum. Aortic valve replacement is noted. Lung volumes are low. The heart is enlarged. Bibasilar airspace disease likely reflects atelectasis without significant  interval change. IMPRESSION: 1. Interval removal of left-sided chest tube and mediastinal drain without pneumothorax. 2. Interval removal Swan-Ganz catheter. 3. Stable low lung volumes. 4. Cardiomegaly and bibasilar airspace disease, likely atelectasis, are stable. Electronically Signed   By: Marin Roberts M.D.   On: 07/14/2018 08:00   Dg Chest Port 1 View  Result Date: 07/13/2018 CLINICAL DATA:  Chest tube, CABG EXAM: PORTABLE CHEST 1 VIEW COMPARISON:  07/12/2018 FINDINGS: Swan-Ganz catheter and left chest tube remain in place. Interval removal of endotracheal tube and NG tube. No pneumothorax. Mild elevation of the right hemidiaphragm. Cardiomegaly with vascular congestion. Bibasilar atelectasis and small effusions. IMPRESSION: Interval extubation. Mild elevation of the right hemidiaphragm with bibasilar atelectasis and small effusions. Cardiomegaly, vascular congestion. Electronically Signed   By: Charlett Nose M.D.   On: 07/13/2018 08:52   Dg Chest Port 1 View  Result Date: 07/12/2018 CLINICAL DATA:  Status post coronary artery bypass graft. EXAM: PORTABLE CHEST 1 VIEW COMPARISON:  Radiographs of July 10, 2018. FINDINGS: Stable cardiomediastinal silhouette. Endotracheal tube is in grossly good position. Distal tip of nasogastric tube is seen in proximal stomach. Right internal jugular catheter is noted with distal tip in expected position of main pulmonary artery. Status post coronary artery bypass graft and aortic valve repair. Left-sided chest tube is noted without definite pneumothorax. Right lung is clear. Mild left basilar subsegmental atelectasis is noted. Bony thorax is unremarkable. IMPRESSION: Endotracheal and nasogastric tubes are in grossly good position. Left-sided chest tube is noted without definite pneumothorax. Mild left basilar subsegmental atelectasis is noted. Electronically Signed   By: Lupita Raider, M.D.   On: 07/12/2018 15:12   Vas US Doppler Pre Cabg  Result Date:  07/10/2018 PREOPERATIVE VASCULAR EVALUATION  Indications: Pre-CABG. Performing Technologist: Olen Cordial RVT  Examination Guidelines: A complete evaluation includes B-mode imaging, spectral Doppler, color Doppler, and power Doppler as needed of all accessible portions of each vessel. Bilateral testing is considered an integral part of a complete examination. Limited examinations for reoccurring indications may be performed as noted.  Right Carotid Findings: +----------+--------+--------+--------+-----------------------+--------+           PSV cm/sEDV cm/sStenosisDescribe               Comments +----------+--------+--------+--------+-----------------------+--------+ CCA Prox  86      20                                              +----------+--------+--------+--------+-----------------------+--------+ CCA Distal50      16              smooth and heterogenous         +----------+--------+--------+--------+-----------------------+--------+ ICA Prox  82      24  smooth and heterogenous         +----------+--------+--------+--------+-----------------------+--------+ ICA Distal84      29                                              +----------+--------+--------+--------+-----------------------+--------+ ECA       72      13                                              +----------+--------+--------+--------+-----------------------+--------+ Portions of this table do not appear on this page. +----------+--------+-------+--------+------------+           PSV cm/sEDV cmsDescribeArm Pressure +----------+--------+-------+--------+------------+ Subclavian67                     101          +----------+--------+-------+--------+------------+ +---------+--------+--+--------+--+---------+ VertebralPSV cm/s49EDV cm/s15Antegrade +---------+--------+--+--------+--+---------+ Left Carotid Findings:  +----------+--------+--------+--------+-----------------------+--------+           PSV cm/sEDV cm/sStenosisDescribe               Comments +----------+--------+--------+--------+-----------------------+--------+ CCA Prox  102     24              smooth and heterogenous         +----------+--------+--------+--------+-----------------------+--------+ CCA Distal75      18              smooth and heterogenous         +----------+--------+--------+--------+-----------------------+--------+ ICA Prox  49      17              smooth and heterogenous         +----------+--------+--------+--------+-----------------------+--------+ ICA Distal59      22                                              +----------+--------+--------+--------+-----------------------+--------+ ECA       96      16                                              +----------+--------+--------+--------+-----------------------+--------+ +----------+--------+--------+--------+------------+ SubclavianPSV cm/sEDV cm/sDescribeArm Pressure +----------+--------+--------+--------+------------+           100                     96           +----------+--------+--------+--------+------------+ +---------+--------+--+--------+--+---------+ VertebralPSV cm/s40EDV cm/s12Antegrade +---------+--------+--+--------+--+---------+  ABI Findings: +--------+------------------+-----+---------+--------+ Right   Rt Pressure (mmHg)IndexWaveform Comment  +--------+------------------+-----+---------+--------+ Brachial101                    triphasic         +--------+------------------+-----+---------+--------+ PTA     149               1.48 triphasic         +--------+------------------+-----+---------+--------+ DP      156               1.54 triphasic         +--------+------------------+-----+---------+--------+ +--------+------------------+-----+---------+-------+  Left    Lt Pressure  (mmHg)IndexWaveform Comment +--------+------------------+-----+---------+-------+ Brachial96                     triphasic        +--------+------------------+-----+---------+-------+ PTA     125               1.24 triphasic        +--------+------------------+-----+---------+-------+ DP      141                    triphasic        +--------+------------------+-----+---------+-------+ +-------+---------------+----------------+ ABI/TBIToday's ABI/TBIPrevious ABI/TBI +-------+---------------+----------------+ Right  1.54                            +-------+---------------+----------------+ Left   1.4                             +-------+---------------+----------------+  Right Doppler Findings: +-----------+--------+-----+---------+-----------------------------------------+ Site       PressureIndexDoppler  Comments                                  +-----------+--------+-----+---------+-----------------------------------------+ Brachial   101          triphasic                                          +-----------+--------+-----+---------+-----------------------------------------+ Radial                  triphasic                                          +-----------+--------+-----+---------+-----------------------------------------+ Ulnar                   triphasic                                          +-----------+--------+-----+---------+-----------------------------------------+ Palmar Arch                      Palmar waveforms are obliterated with                                      radial and ulnar compression.             +-----------+--------+-----+---------+-----------------------------------------+  Left Doppler Findings: +-----------+--------+-----+---------+-----------------------------------------+ Site       PressureIndexDoppler  Comments                                   +-----------+--------+-----+---------+-----------------------------------------+ Brachial   96           triphasic                                          +-----------+--------+-----+---------+-----------------------------------------+ Radial  triphasic                                          +-----------+--------+-----+---------+-----------------------------------------+ Ulnar                   triphasic                                          +-----------+--------+-----+---------+-----------------------------------------+ Palmar Arch                      Palmar waveforms are obliterated with                                      radial and ulnar compression.             +-----------+--------+-----+---------+-----------------------------------------+  Summary: Right Carotid: Velocities in the right ICA are consistent with a 1-39% stenosis. Left Carotid: Velocities in the left ICA are consistent with a 1-39% stenosis. Vertebrals: Bilateral vertebral arteries demonstrate antegrade flow. Right ABI: Resting right ankle-brachial index indicates noncompressible right lower extremity arteries. Left ABI: Resting left ankle-brachial index indicates noncompressible left lower extremity arteries.  Electronically signed by Coral Else MD on 07/10/2018 at 5:38:23 PM.    Final    Discharge Instructions    Amb Referral to Cardiac Rehabilitation   Complete by:  As directed    Diagnosis:   CABG Valve Replacement     Valve:  Aortic   CABG X ___:  3      Discharge Medications: Allergies as of 07/16/2018   No Known Allergies     Medication List    STOP taking these medications   amLODipine 10 MG tablet Commonly known as:  NORVASC   lisinopril 40 MG tablet Commonly known as:  PRINIVIL,ZESTRIL     TAKE these medications   acetaminophen 325 MG tablet Commonly known as:  TYLENOL Take 2 tablets (650 mg total) by mouth every 6 (six) hours as needed for mild  pain.   aspirin 325 MG EC tablet Take 1 tablet (325 mg total) by mouth daily. What changed:    medication strength  how much to take   ciprofloxacin 500 MG tablet Commonly known as:  CIPRO Take 500 mg by mouth 2 (two) times daily as needed (chronic prostatitis).   co-enzyme Q-10 30 MG capsule Take 30 mg by mouth daily.   ergocalciferol 50000 units capsule Commonly known as:  VITAMIN D2 Take 50,000 Units by mouth 2 (two) times a week. Saturdays & Wednesdays.   esomeprazole 40 MG capsule Commonly known as:  NEXIUM Take 40 mg by mouth daily before breakfast.   ferrous sulfate 325 (65 FE) MG tablet Take 1 tablet (325 mg total) by mouth daily with lunch.   folic acid 1 MG tablet Commonly known as:  FOLVITE Take 1 tablet (1 mg total) by mouth daily.   furosemide 40 MG tablet Commonly known as:  LASIX Take 1 tablet (40 mg total) by mouth daily for 5 days. Start taking on:  07/17/2018   LIVALO 1 MG Tabs Generic drug:  Pitavastatin Calcium Take 1 mg by mouth daily.   metoprolol tartrate 25 MG tablet Commonly known as:  LOPRESSOR Take  1 tablet (25 mg total) by mouth 2 (two) times daily.   oxyCODONE 5 MG immediate release tablet Commonly known as:  Oxy IR/ROXICODONE Take 1-2 tablets (5-10 mg total) by mouth every 4 (four) hours as needed for severe pain.   potassium chloride SA 20 MEQ tablet Commonly known as:  K-DUR,KLOR-CON Take 1 tablet (20 mEq total) by mouth daily for 5 days. Start taking on:  07/17/2018      The patient has been discharged on:   1.Beta Blocker:  Yes [ x  ]                              No   [   ]                              If No, reason:  2.Ace Inhibitor/ARB: Yes [   ]                                     No  [  x  ]                                     If No, reason: labile BP  3.Statin:   Yes [  x ]                  No  [   ]                  If No, reason:  4.Ecasa:  Yes  [ x  ]                  No   [   ]                  If No,  reason:  Follow Up Appointments: Follow-up Information    [name], MD Follow up on 08/15/2018.   Specialty:  Cardiothoracic Surgery Why:  Appointment is at 2:00, please get CXR [facility] imaging located on first floor of our office building Contact information: [address] [place] [address] [phone]        Wendall Stade, MD Follow up.   Specialty:  Cardiology Contact information: [address] [place] [address] [phone]        [facility] [place] Follow up on 07/23/2018.   Specialty:  Cardiothoracic Surgery Why:  Appointment is at 10:30 for suture removal Contact information: [address] [place], [address] [place] [address] [phone]          Signed: Carl Best 07/16/2018, 11:22 AM

## 2018-07-13 NOTE — Progress Notes (Signed)
Patient ID: Daniel Mann, male   DOB: 15-Oct-1950, 67 y.o.   MRN: [id number] [facility] Evening Rounds:  Hemodynamically stable in sinus rhythm. Neo is off. Urine output ok Ambulated today. Working on IS. BMET    Component Value Date/Time   NA 135 07/13/2018 0412   NA 138 06/28/2018 0959   K 4.5 07/13/2018 0412   CL 108 07/13/2018 0412   CO2 24 07/13/2018 0412   GLUCOSE 109 (H) 07/13/2018 0412   BUN 10 07/13/2018 0412   BUN 14 06/28/2018 0959   CREATININE 0.75 07/13/2018 1612   CALCIUM 8.7 (L) 07/13/2018 0412   GFRNONAA >60 07/13/2018 1612   GFRAA >60 07/13/2018 1612   CBC    Component Value Date/Time   WBC 12.1 (H) 07/13/2018 1612   RBC 2.92 (L) 07/13/2018 1612   HGB 9.3 (L) 07/13/2018 1612   HGB 15.7 06/28/2018 0959   HCT 28.0 (L) 07/13/2018 1612   HCT 45.5 06/28/2018 0959   PLT 143 (L) 07/13/2018 1612   PLT 298 06/28/2018 0959   MCV 95.9 07/13/2018 1612   MCV 91 06/28/2018 0959   MCH 31.8 07/13/2018 1612   MCHC 33.2 07/13/2018 1612   RDW 12.8 07/13/2018 1612   RDW 12.5 06/28/2018 0959   LYMPHSABS 1.8 06/28/2018 0959   EOSABS 0.1 06/28/2018 0959   BASOSABS 0.0 06/28/2018 0959

## 2018-07-14 ENCOUNTER — Inpatient Hospital Stay (HOSPITAL_COMMUNITY): Payer: BLUE CROSS/BLUE SHIELD

## 2018-07-14 LAB — BASIC METABOLIC PANEL
ANION GAP: 3 — AB (ref 5–15)
BUN: 11 mg/dL (ref 8–23)
CALCIUM: 9.1 mg/dL (ref 8.9–10.3)
CO2: 26 mmol/L (ref 22–32)
CREATININE: 0.63 mg/dL (ref 0.61–1.24)
Chloride: 104 mmol/L (ref 98–111)
Glucose, Bld: 131 mg/dL — ABNORMAL HIGH (ref 70–99)
Potassium: 4.4 mmol/L (ref 3.5–5.1)
SODIUM: 133 mmol/L — AB (ref 135–145)

## 2018-07-14 LAB — GLUCOSE, CAPILLARY
GLUCOSE-CAPILLARY: 127 mg/dL — AB (ref 70–99)
GLUCOSE-CAPILLARY: 152 mg/dL — AB (ref 70–99)

## 2018-07-14 LAB — CBC
HCT: 26.7 % — ABNORMAL LOW (ref 39.0–52.0)
Hemoglobin: 8.5 g/dL — ABNORMAL LOW (ref 13.0–17.0)
MCH: 30.8 pg (ref 26.0–34.0)
MCHC: 31.8 g/dL (ref 30.0–36.0)
MCV: 96.7 fL (ref 80.0–100.0)
NRBC: 0 % (ref 0.0–0.2)
PLATELETS: 136 10*3/uL — AB (ref 150–400)
RBC: 2.76 MIL/uL — AB (ref 4.22–5.81)
RDW: 12.9 % (ref 11.5–15.5)
WBC: 12.9 10*3/uL — AB (ref 4.0–10.5)

## 2018-07-14 MED ORDER — OXYCODONE HCL 5 MG PO TABS
5.0000 mg | ORAL_TABLET | ORAL | Status: DC | PRN
  Administered 2018-07-14: 5 mg via ORAL
  Filled 2018-07-14: qty 1

## 2018-07-14 MED ORDER — SODIUM CHLORIDE 0.9% FLUSH
3.0000 mL | Freq: Two times a day (BID) | INTRAVENOUS | Status: DC
  Administered 2018-07-14 – 2018-07-15 (×3): 3 mL via INTRAVENOUS

## 2018-07-14 MED ORDER — BISACODYL 10 MG RE SUPP: 10.0000 mg | Freq: Every day | RECTAL | Status: DC | PRN

## 2018-07-14 MED ORDER — ACETAMINOPHEN 325 MG PO TABS: 650.0000 mg | ORAL_TABLET | Freq: Four times a day (QID) | ORAL | Status: DC | PRN

## 2018-07-14 MED ORDER — POTASSIUM CHLORIDE CRYS ER 20 MEQ PO TBCR
20.0000 meq | EXTENDED_RELEASE_TABLET | Freq: Two times a day (BID) | ORAL | Status: DC
  Administered 2018-07-14 – 2018-07-16 (×5): 20 meq via ORAL
  Filled 2018-07-14 (×5): qty 1

## 2018-07-14 MED ORDER — ONDANSETRON HCL 4 MG/2ML IJ SOLN: 4.0000 mg | Freq: Four times a day (QID) | INTRAMUSCULAR | Status: DC | PRN

## 2018-07-14 MED ORDER — FUROSEMIDE 10 MG/ML IJ SOLN
40.0000 mg | Freq: Once | INTRAMUSCULAR | Status: AC
  Administered 2018-07-14: 40 mg via INTRAVENOUS
  Filled 2018-07-14: qty 4

## 2018-07-14 MED ORDER — DOCUSATE SODIUM 100 MG PO CAPS
200.0000 mg | ORAL_CAPSULE | Freq: Every day | ORAL | Status: DC
  Administered 2018-07-15 – 2018-07-16 (×2): 200 mg via ORAL
  Filled 2018-07-14 (×2): qty 2

## 2018-07-14 MED ORDER — ASPIRIN EC 325 MG PO TBEC
325.0000 mg | DELAYED_RELEASE_TABLET | Freq: Every day | ORAL | Status: DC
  Administered 2018-07-15 – 2018-07-16 (×2): 325 mg via ORAL
  Filled 2018-07-14 (×2): qty 1

## 2018-07-14 MED ORDER — BISACODYL 5 MG PO TBEC
10.0000 mg | DELAYED_RELEASE_TABLET | Freq: Every day | ORAL | Status: DC | PRN
  Administered 2018-07-16: 10 mg via ORAL
  Filled 2018-07-14: qty 2

## 2018-07-14 MED ORDER — SODIUM CHLORIDE 0.9 % IV SOLN: 250.0000 mL | INTRAVENOUS | Status: DC | PRN

## 2018-07-14 MED ORDER — ONDANSETRON HCL 4 MG PO TABS: 4.0000 mg | ORAL_TABLET | Freq: Four times a day (QID) | ORAL | Status: DC | PRN

## 2018-07-14 MED ORDER — TRAMADOL HCL 50 MG PO TABS
50.0000 mg | ORAL_TABLET | ORAL | Status: DC | PRN
  Administered 2018-07-14: 50 mg via ORAL
  Filled 2018-07-14: qty 1

## 2018-07-14 MED ORDER — MOVING RIGHT ALONG BOOK
Freq: Once | Status: AC
  Administered 2018-07-14: 17:00:00
  Filled 2018-07-14: qty 1

## 2018-07-14 MED ORDER — METOPROLOL TARTRATE 25 MG PO TABS
25.0000 mg | ORAL_TABLET | Freq: Two times a day (BID) | ORAL | Status: DC
  Administered 2018-07-14 – 2018-07-16 (×5): 25 mg via ORAL
  Filled 2018-07-14 (×6): qty 1

## 2018-07-14 MED ORDER — SODIUM CHLORIDE 0.9% FLUSH: 3.0000 mL | INTRAVENOUS | Status: DC | PRN

## 2018-07-14 NOTE — Progress Notes (Signed)
Patient ID: Daniel Mann, male   DOB: 06-05-1951, 67 y.o.   MRN: [id number] [facility] Evening Rounds:  Stable day Awaiting bed on 4E.

## 2018-07-14 NOTE — Progress Notes (Signed)
Pt arrived from 2h VSS, A&0 x3 chg done call bell within reach, bed alarm on

## 2018-07-14 NOTE — Progress Notes (Signed)
2 Days Post-Op Procedure(s) (LRB): AORTIC VALVE REPLACEMENT (AVR) using an Inspiris Valve size  23 (N/A) CORONARY ARTERY BYPASS GRAFTING (CABG) times three using left internal mammary artery and right greater saphenous vein harvested endoscopically. (N/A) TRANSESOPHAGEAL ECHOCARDIOGRAM (TEE) (N/A) Subjective: Says he feels fantastic  Objective: Vital signs in last 24 hours: Temp:  [98.2 F (36.8 C)-99 F (37.2 C)] 98.9 F (37.2 C) (11/02 0844) Pulse Rate:  [77-93] 87 (11/02 0900) Cardiac Rhythm: Normal sinus rhythm (11/02 0800) Resp:  [0-23] 21 (11/02 0900) BP: (84-146)/(40-92) 123/68 (11/02 0900) SpO2:  [91 %-100 %] 98 % (11/02 0900) Weight:  [107.5 kg] 107.5 kg (11/02 0500)  Hemodynamic parameters for last 24 hours:    Intake/Output from previous day: 11/01 0701 - 11/02 0700 In: 1253.9 [P.O.:630; I.V.:524; IV Piggyback:99.9] Out: 1320 [Urine:1220; Chest Tube:100] Intake/Output this shift: Total I/O In: 240 [P.O.:240] Out: 150 [Urine:150]  General appearance: alert and cooperative Neurologic: intact Heart: regular rate and rhythm, S1, S2 normal, no murmur, click, rub or gallop Lungs: clear to auscultation bilaterally Extremities: edema mild Wound: dressings dry  Lab Results: Recent Labs    07/13/18 1612 07/13/18 1621 07/14/18 0418  WBC 12.1*  --  12.9*  HGB 9.3* 8.8* 8.5*  HCT 28.0* 26.0* 26.7*  PLT 143*  --  136*   BMET:  Recent Labs    07/13/18 0412  07/13/18 1621 07/14/18 0418  NA 135  --  135 133*  K 4.5  --  4.7 4.4  CL 108  --  99 104  CO2 24  --   --  26  GLUCOSE 109*  --  167* 131*  BUN 10  --  12 11  CREATININE 0.60*   < > 0.70 0.63  CALCIUM 8.7*  --   --  9.1   < > = values in this interval not displayed.    PT/INR:  Recent Labs    07/12/18 1432  LABPROT 17.8*  INR 1.48   ABG    Component Value Date/Time   PHART 7.311 (L) 07/12/2018 1852   HCO3 22.8 07/12/2018 1852   TCO2 27 07/13/2018 1621   ACIDBASEDEF 3.0 (H) 07/12/2018 1852    O2SAT 96.0 07/12/2018 1852   CBG (last 3)  Recent Labs    07/13/18 2015 07/13/18 2345 07/14/18 0339  GLUCAP 151* 135* 127*   CXR: mild bilateral LL atelectasis  Assessment/Plan: S/P Procedure(s) (LRB): AORTIC VALVE REPLACEMENT (AVR) using an Inspiris Valve size  23 (N/A) CORONARY ARTERY BYPASS GRAFTING (CABG) times three using left internal mammary artery and right greater saphenous vein harvested endoscopically. (N/A) TRANSESOPHAGEAL ECHOCARDIOGRAM (TEE) (N/A)  POD 2 Hemodynamically stable in sinus rhythm. Resume Lopressor.  Volume excess: will diurese  DC sleeve and foley.  Transfer to 4E and continue ambulation and IS.     LOS: 2 days    [name] 07/14/2018

## 2018-07-15 ENCOUNTER — Encounter (HOSPITAL_COMMUNITY): Payer: Self-pay

## 2018-07-15 ENCOUNTER — Other Ambulatory Visit: Payer: Self-pay

## 2018-07-15 LAB — BASIC METABOLIC PANEL
Anion gap: 4 — ABNORMAL LOW (ref 5–15)
BUN: 9 mg/dL (ref 8–23)
CHLORIDE: 101 mmol/L (ref 98–111)
CO2: 29 mmol/L (ref 22–32)
CREATININE: 0.61 mg/dL (ref 0.61–1.24)
Calcium: 9.4 mg/dL (ref 8.9–10.3)
GFR calc Af Amer: >60 mL/min (ref >60)
GFR calc non Af Amer: >60 mL/min (ref >60)
GLUCOSE: 124 mg/dL — AB (ref 70–99)
Potassium: 4.2 mmol/L (ref 3.5–5.1)
SODIUM: 134 mmol/L — AB (ref 135–145)

## 2018-07-15 LAB — CBC
HEMATOCRIT: 26.7 % — AB (ref 39.0–52.0)
HEMOGLOBIN: 8.3 g/dL — AB (ref 13.0–17.0)
MCH: 30.2 pg (ref 26.0–34.0)
MCHC: 31.1 g/dL (ref 30.0–36.0)
MCV: 97.1 fL (ref 80.0–100.0)
Platelets: 149 10*3/uL — ABNORMAL LOW (ref 150–400)
RBC: 2.75 MIL/uL — ABNORMAL LOW (ref 4.22–5.81)
RDW: 13.1 % (ref 11.5–15.5)
WBC: 12.4 10*3/uL — ABNORMAL HIGH (ref 4.0–10.5)
nRBC: 0 % (ref 0.0–0.2)

## 2018-07-15 MED ORDER — GUAIFENESIN ER 600 MG PO TB12
1200.0000 mg | ORAL_TABLET | Freq: Two times a day (BID) | ORAL | Status: DC
  Administered 2018-07-15 – 2018-07-16 (×3): 1200 mg via ORAL
  Filled 2018-07-15 (×3): qty 2

## 2018-07-15 MED ORDER — FOLIC ACID 1 MG PO TABS
1.0000 mg | ORAL_TABLET | Freq: Every day | ORAL | Status: DC
  Administered 2018-07-15 – 2018-07-16 (×2): 1 mg via ORAL
  Filled 2018-07-15 (×2): qty 1

## 2018-07-15 MED ORDER — FERROUS SULFATE 325 (65 FE) MG PO TABS
325.0000 mg | ORAL_TABLET | Freq: Every day | ORAL | Status: DC
  Administered 2018-07-15 – 2018-07-16 (×2): 325 mg via ORAL
  Filled 2018-07-15 (×2): qty 1

## 2018-07-15 NOTE — Progress Notes (Signed)
Pt ambulated 470 ft in hallway with walker. Pt tolerated well. Returned to bed with call light within reach. Will continue current plan of care.   Ardeen Jourdain BSN, RN

## 2018-07-15 NOTE — Progress Notes (Signed)
Epicardial pacing wires removed, per order. Sites clean and dry. Vitals stable and being monitored per protocol. Pt on bedrest x1 hour. Will continue to monitor.   Ardeen Jourdain BSN, RN

## 2018-07-15 NOTE — Progress Notes (Addendum)
      [address]       [place] [id number]             [phone]        3 Days Post-Op Procedure(s) (LRB): AORTIC VALVE REPLACEMENT (AVR) using an Inspiris Valve size  23 (N/A) CORONARY ARTERY BYPASS GRAFTING (CABG) times three using left internal mammary artery and right greater saphenous vein harvested endoscopically. (N/A) TRANSESOPHAGEAL ECHOCARDIOGRAM (TEE) (N/A)  Subjective: Patient passing flatus but no bowel movement yet. Patient does not want a laxative at this time.  Objective: Vital signs in last 24 hours: Temp:  [97.6 F (36.4 C)-98.7 F (37.1 C)] 97.6 F (36.4 C) (11/03 0837) Pulse Rate:  [79-101] 101 (11/03 0837) Cardiac Rhythm: Normal sinus rhythm (11/03 0700) Resp:  [11-28] 20 (11/03 0837) BP: (81-123)/(56-84) 114/69 (11/03 0837) SpO2:  [91 %-98 %] 91 % (11/03 0837) Weight:  [106.7 kg] 106.7 kg (11/03 0509)  Pre op weight 107.7  kg Current Weight  07/15/18 106.7 kg   Intake/Output from previous day: 11/02 0701 - 11/03 0700 In: 540 [P.O.:540] Out: 1750 [Urine:1750]   Physical Exam:  Cardiovascular: RRR, no murmur Pulmonary: Clear to auscultation bilaterally Abdomen: Soft, non tender, bowel sounds present. Extremities: Trace bilateral lower extremity edema. Wounds: Clean and dry.  No erythema or signs of infection.  Lab Results: CBC: Recent Labs    07/14/18 0418 07/15/18 0222  WBC 12.9* 12.4*  HGB 8.5* 8.3*  HCT 26.7* 26.7*  PLT 136* 149*   BMET:  Recent Labs    07/14/18 0418 07/15/18 0222  NA 133* 134*  K 4.4 4.2  CL 104 101  CO2 26 29  GLUCOSE 131* 124*  BUN 11 9  CREATININE 0.63 0.61  CALCIUM 9.1 9.4    PT/INR:  Lab Results  Component Value Date   INR 1.48 07/12/2018   INR 1.01 07/10/2018   ABG:  INR: Will add last result for INR, ABG once components are confirmed Will add last 4 CBG results once components are confirmed  Assessment/Plan:  1. CV - ST, occasional ST with HR in the low 100's. On  Lopressor 25 mg bid. Would like to increase BB but with BP not able to at this time. 2.  Pulmonary - On 1 L of oxygen via [place]. Wean to room air as able. Check CXR in am. Encourage incentive spirometer 3.  Acute blood loss anemia - H and H stable at 8.3 and 26.7. Start ferrous sulfate. 4. Remove EPW 5. Home 1-2 days  Donielle M ZimmermanPA-C 07/15/2018,9:20 AM    Chart reviewed, patient examined, agree with above. He looks good. HR is low 100's. BP 112/67. On Lopressor 25 bid. Will continue to observe for now since BP low normal. [phone]

## 2018-07-16 ENCOUNTER — Inpatient Hospital Stay (HOSPITAL_COMMUNITY): Payer: BLUE CROSS/BLUE SHIELD

## 2018-07-16 LAB — GLUCOSE, CAPILLARY: Glucose-Capillary: 135 mg/dL — ABNORMAL HIGH (ref 70–99)

## 2018-07-16 MED ORDER — ACETAMINOPHEN 325 MG PO TABS: 650.0000 mg | ORAL_TABLET | Freq: Four times a day (QID) | ORAL | Status: DC | PRN

## 2018-07-16 MED ORDER — ASPIRIN 325 MG PO TBEC: 325.0000 mg | DELAYED_RELEASE_TABLET | Freq: Every day | ORAL | 0 refills | Status: DC

## 2018-07-16 MED ORDER — FOLIC ACID 1 MG PO TABS: 1.0000 mg | ORAL_TABLET | Freq: Every day | ORAL | 0 refills | Status: DC

## 2018-07-16 MED ORDER — OXYCODONE HCL 5 MG PO TABS: 5.0000 mg | ORAL_TABLET | ORAL | 0 refills | Status: DC | PRN

## 2018-07-16 MED ORDER — FUROSEMIDE 40 MG PO TABS: 40.0000 mg | ORAL_TABLET | Freq: Every day | ORAL | 0 refills | Status: DC

## 2018-07-16 MED ORDER — FERROUS SULFATE 325 (65 FE) MG PO TABS: 325.0000 mg | ORAL_TABLET | Freq: Every day | ORAL | 0 refills | Status: DC

## 2018-07-16 MED ORDER — POTASSIUM CHLORIDE CRYS ER 20 MEQ PO TBCR: 20.0000 meq | EXTENDED_RELEASE_TABLET | Freq: Every day | ORAL | 0 refills | Status: DC

## 2018-07-16 MED ORDER — METOPROLOL TARTRATE 25 MG PO TABS: 25.0000 mg | ORAL_TABLET | Freq: Two times a day (BID) | ORAL | 3 refills | Status: DC

## 2018-07-16 NOTE — Progress Notes (Signed)
      [address]       [place] [address]             [phone]      4 Days Post-Op Procedure(s) (LRB): AORTIC VALVE REPLACEMENT (AVR) using an Inspiris Valve size  23 (N/A) CORONARY ARTERY BYPASS GRAFTING (CABG) times three using left internal mammary artery and right greater saphenous vein harvested endoscopically. (N/A) TRANSESOPHAGEAL ECHOCARDIOGRAM (TEE) (N/A)   Subjective:  No new complaints. Feels great, already ambulated around the hallway.  He was able to move his bowels yesterday as well.  He would really like to go home today.  Objective: Vital signs in last 24 hours: Temp:  [97.6 F (36.4 C)-99 F (37.2 C)] 98.5 F (36.9 C) (11/04 0543) Pulse Rate:  [86-101] 86 (11/04 0543) Cardiac Rhythm: Normal sinus rhythm (11/04 0543) Resp:  [16-21] 16 (11/04 0543) BP: (99-130)/(64-87) 99/67 (11/04 0543) SpO2:  [91 %-97 %] 97 % (11/04 0543) Weight:  [103.8 kg] 103.8 kg (11/04 0543)  Intake/Output from previous day: 11/03 0701 - 11/04 0700 In: 603 [P.O.:600; I.V.:3] Out: 50 [Urine:50]  General appearance: alert, cooperative and no distress Heart: regular rate and rhythm Lungs: diminished breath sounds bibasilar Abdomen: soft, non-tender; bowel sounds normal; no masses,  no organomegaly Extremities: edema pitting Wound: clean and dry  Lab Results: Recent Labs    07/14/18 0418 07/15/18 0222  WBC 12.9* 12.4*  HGB 8.5* 8.3*  HCT 26.7* 26.7*  PLT 136* 149*   BMET:  Recent Labs    07/14/18 0418 07/15/18 0222  NA 133* 134*  K 4.4 4.2  CL 104 101  CO2 26 29  GLUCOSE 131* 124*  BUN 11 9  CREATININE 0.63 0.61  CALCIUM 9.1 9.4    PT/INR: No results for input(s): LABPROT, INR in the last 72 hours. ABG    Component Value Date/Time   PHART 7.311 (L) 07/12/2018 1852   HCO3 22.8 07/12/2018 1852   TCO2 27 07/13/2018 1621   ACIDBASEDEF 3.0 (H) 07/12/2018 1852   O2SAT 96.0 07/12/2018 1852   CBG (last 3)  Recent Labs    07/13/18 2345  07/14/18 0339 07/14/18 1549  GLUCAP 135* 127* 152*    Assessment/Plan: S/P Procedure(s) (LRB): AORTIC VALVE REPLACEMENT (AVR) using an Inspiris Valve size  23 (N/A) CORONARY ARTERY BYPASS GRAFTING (CABG) times three using left internal mammary artery and right greater saphenous vein harvested endoscopically. (N/A) TRANSESOPHAGEAL ECHOCARDIOGRAM (TEE) (N/A)  1. CV- NSR, BP is controlled, continue Lopressor at current dose 2. Pulm- off oxygen, CXR with small bilateral pleural effusions 3. Renal- creatinine WNL, weight is mildly elevated, will give a short course Lasix, potassium supplement 4. GI- constipation resolved 5. Dispo- patient stable, will d/c home today if okay with Dr. Laneta Simmers   LOS: 4 days    Lowella Dandy 07/16/2018

## 2018-07-16 NOTE — Progress Notes (Signed)
Pt ambulated 470 feet independently on room air saturation 91

## 2018-07-16 NOTE — Progress Notes (Signed)
07/16/2018 11:50 AM  Discharge AVS meds taken today and those due this evening reviewed.  Follow-up appointments and when to call md reviewed.  D/C IV and TELE.  Questions and concerns addressed.   D/C home per orders. Kathryne Hitch

## 2018-07-16 NOTE — Progress Notes (Signed)
[phone] Pt has been walking independently with steady gait and no walker. Education completed with pt and son (who is Charity fundraiser) and understanding voiced. Reviewed staying in the tube, sternal precautions, IS, ex ed and gave heart healthy diet. Discussed CRP 2 and referred to [facility]. Wrote down how to view discharge video.  Luetta Nutting RN BSN 07/16/2018 9:24 AM

## 2018-07-16 NOTE — Progress Notes (Signed)
Pt ambulated 470 feet with walker tolerated well pt up in chair call bell within reach  Oxygen 89 after ambulation on ra

## 2018-07-18 LAB — TYPE AND SCREEN
ABO/RH(D): B NEG
ANTIBODY SCREEN: NEGATIVE
UNIT DIVISION: 0
Unit division: 0

## 2018-07-18 LAB — BPAM RBC
Blood Product Expiration Date: 201911222359
Blood Product Expiration Date: 201911222359
ISSUE DATE / TIME: 201910310812
ISSUE DATE / TIME: 201910310812
UNIT TYPE AND RH: 1700
Unit Type and Rh: 1700

## 2018-07-19 ENCOUNTER — Telehealth: Payer: Self-pay

## 2018-07-19 MED FILL — Magnesium Sulfate Inj 50%: INTRAMUSCULAR | Qty: 10 | Status: AC

## 2018-07-19 MED FILL — Potassium Chloride Inj 2 mEq/ML: INTRAVENOUS | Qty: 40 | Status: AC

## 2018-07-19 MED FILL — Heparin Sodium (Porcine) Inj 1000 Unit/ML: INTRAMUSCULAR | Qty: 30 | Status: AC

## 2018-07-19 NOTE — Telephone Encounter (Signed)
[name] son, [name] contacted the office about what medications he could take for nasal congestion.  I advised that he could take over-the-counter benadryl, allergy medication such as Zyrtec as directed.  I did advise to stay away from [facility] or like medications due to blood pressure medication.  He acknowledged receipt.  He also asked if he could take Ibuprofen.  I advised that he could take it, in conjunction with Tylenol.  He acknowledged receipt.

## 2018-07-20 ENCOUNTER — Encounter (HOSPITAL_COMMUNITY): Payer: Self-pay | Admitting: Emergency Medicine

## 2018-07-20 ENCOUNTER — Observation Stay (HOSPITAL_COMMUNITY)
Admission: EM | Admit: 2018-07-20 | Discharge: 2018-07-22 | Disposition: A | Discharge status: Home / Self Care | Payer: BLUE CROSS/BLUE SHIELD | Attending: Internal Medicine | Admitting: Internal Medicine

## 2018-07-20 ENCOUNTER — Emergency Department (HOSPITAL_COMMUNITY): Payer: BLUE CROSS/BLUE SHIELD

## 2018-07-20 ENCOUNTER — Telehealth: Payer: Self-pay

## 2018-07-20 DIAGNOSIS — Z87891 Personal history of nicotine dependence: Secondary | ICD-10-CM | POA: Diagnosis not present

## 2018-07-20 DIAGNOSIS — R7989 Other specified abnormal findings of blood chemistry: Secondary | ICD-10-CM | POA: Diagnosis not present

## 2018-07-20 DIAGNOSIS — R4182 Altered mental status, unspecified: Secondary | ICD-10-CM | POA: Diagnosis not present

## 2018-07-20 DIAGNOSIS — Z79899 Other long term (current) drug therapy: Secondary | ICD-10-CM | POA: Insufficient documentation

## 2018-07-20 DIAGNOSIS — K219 Gastro-esophageal reflux disease without esophagitis: Secondary | ICD-10-CM | POA: Diagnosis not present

## 2018-07-20 DIAGNOSIS — N411 Chronic prostatitis: Secondary | ICD-10-CM

## 2018-07-20 DIAGNOSIS — Z7982 Long term (current) use of aspirin: Secondary | ICD-10-CM | POA: Insufficient documentation

## 2018-07-20 DIAGNOSIS — Z951 Presence of aortocoronary bypass graft: Secondary | ICD-10-CM

## 2018-07-20 DIAGNOSIS — R2241 Localized swelling, mass and lump, right lower limb: Secondary | ICD-10-CM | POA: Insufficient documentation

## 2018-07-20 DIAGNOSIS — R4020 Unspecified coma: Secondary | ICD-10-CM | POA: Diagnosis not present

## 2018-07-20 DIAGNOSIS — G9340 Encephalopathy, unspecified: Secondary | ICD-10-CM | POA: Diagnosis not present

## 2018-07-20 DIAGNOSIS — R079 Chest pain, unspecified: Secondary | ICD-10-CM | POA: Diagnosis not present

## 2018-07-20 DIAGNOSIS — I2510 Atherosclerotic heart disease of native coronary artery without angina pectoris: Secondary | ICD-10-CM | POA: Diagnosis present

## 2018-07-20 DIAGNOSIS — I509 Heart failure, unspecified: Secondary | ICD-10-CM | POA: Diagnosis not present

## 2018-07-20 DIAGNOSIS — R778 Other specified abnormalities of plasma proteins: Secondary | ICD-10-CM

## 2018-07-20 DIAGNOSIS — E785 Hyperlipidemia, unspecified: Secondary | ICD-10-CM | POA: Diagnosis not present

## 2018-07-20 DIAGNOSIS — I10 Essential (primary) hypertension: Secondary | ICD-10-CM | POA: Diagnosis present

## 2018-07-20 DIAGNOSIS — I110 Hypertensive heart disease with heart failure: Secondary | ICD-10-CM | POA: Diagnosis not present

## 2018-07-20 DIAGNOSIS — E782 Mixed hyperlipidemia: Secondary | ICD-10-CM | POA: Diagnosis present

## 2018-07-20 DIAGNOSIS — R0789 Other chest pain: Secondary | ICD-10-CM | POA: Diagnosis not present

## 2018-07-20 DIAGNOSIS — R410 Disorientation, unspecified: Secondary | ICD-10-CM | POA: Diagnosis not present

## 2018-07-20 DIAGNOSIS — I639 Cerebral infarction, unspecified: Secondary | ICD-10-CM | POA: Diagnosis not present

## 2018-07-20 DIAGNOSIS — D649 Anemia, unspecified: Secondary | ICD-10-CM | POA: Insufficient documentation

## 2018-07-20 DIAGNOSIS — R413 Other amnesia: Secondary | ICD-10-CM

## 2018-07-20 HISTORY — DX: Encephalopathy, unspecified: G93.40

## 2018-07-20 LAB — URINALYSIS, ROUTINE W REFLEX MICROSCOPIC
Bilirubin Urine: NEGATIVE
GLUCOSE, UA: NEGATIVE mg/dL
Hgb urine dipstick: NEGATIVE
KETONES UR: NEGATIVE mg/dL
LEUKOCYTES UA: NEGATIVE
NITRITE: NEGATIVE
PH: 6 (ref 5.0–8.0)
PROTEIN: NEGATIVE mg/dL
Specific Gravity, Urine: 1.016 (ref 1.005–1.030)

## 2018-07-20 LAB — COMPREHENSIVE METABOLIC PANEL
ALBUMIN: 3.1 g/dL — AB (ref 3.5–5.0)
ALT: 32 U/L (ref 0–44)
AST: 25 U/L (ref 15–41)
Alkaline Phosphatase: 54 U/L (ref 38–126)
Anion gap: 3 — ABNORMAL LOW (ref 5–15)
BILIRUBIN TOTAL: 0.8 mg/dL (ref 0.3–1.2)
BUN: 14 mg/dL (ref 8–23)
CHLORIDE: 106 mmol/L (ref 98–111)
CO2: 28 mmol/L (ref 22–32)
CREATININE: 0.96 mg/dL (ref 0.61–1.24)
Calcium: 9.9 mg/dL (ref 8.9–10.3)
GFR calc Af Amer: >60 mL/min (ref >60)
GFR calc non Af Amer: >60 mL/min (ref >60)
GLUCOSE: 115 mg/dL — AB (ref 70–99)
POTASSIUM: 4.6 mmol/L (ref 3.5–5.1)
Sodium: 137 mmol/L (ref 135–145)
Total Protein: 5.7 g/dL — ABNORMAL LOW (ref 6.5–8.1)

## 2018-07-20 LAB — CBC WITH DIFFERENTIAL/PLATELET
ABS IMMATURE GRANULOCYTES: 0.08 10*3/uL — AB (ref 0.00–0.07)
Basophils Absolute: 0 10*3/uL (ref 0.0–0.1)
Basophils Relative: 0 %
EOS PCT: 2 %
Eosinophils Absolute: 0.2 10*3/uL (ref 0.0–0.5)
HEMATOCRIT: 29.4 % — AB (ref 39.0–52.0)
HEMOGLOBIN: 9.4 g/dL — AB (ref 13.0–17.0)
Immature Granulocytes: 1 %
LYMPHS PCT: 13 %
Lymphs Abs: 1.4 10*3/uL (ref 0.7–4.0)
MCH: 30.6 pg (ref 26.0–34.0)
MCHC: 32 g/dL (ref 30.0–36.0)
MCV: 95.8 fL (ref 80.0–100.0)
MONOS PCT: 9 %
Monocytes Absolute: 1 10*3/uL (ref 0.1–1.0)
NEUTROS ABS: 8.5 10*3/uL — AB (ref 1.7–7.7)
Neutrophils Relative %: 75 %
Platelets: 185 10*3/uL (ref 150–400)
RBC: 3.07 MIL/uL — ABNORMAL LOW (ref 4.22–5.81)
RDW: 13 % (ref 11.5–15.5)
WBC: 11.3 10*3/uL — ABNORMAL HIGH (ref 4.0–10.5)
nRBC: 0 % (ref 0.0–0.2)

## 2018-07-20 LAB — I-STAT CG4 LACTIC ACID, ED: LACTIC ACID, VENOUS: 0.86 mmol/L (ref 0.5–1.9)

## 2018-07-20 LAB — CBG MONITORING, ED: Glucose-Capillary: 102 mg/dL — ABNORMAL HIGH (ref 70–99)

## 2018-07-20 LAB — I-STAT TROPONIN, ED: TROPONIN I, POC: 0.12 ng/mL — AB (ref 0.00–0.08)

## 2018-07-20 LAB — PROTIME-INR
INR: 1.2
PROTHROMBIN TIME: 15.1 s (ref 11.4–15.2)

## 2018-07-20 MED ORDER — ASPIRIN EC 325 MG PO TBEC
325.0000 mg | DELAYED_RELEASE_TABLET | Freq: Every day | ORAL | Status: DC
  Administered 2018-07-21 – 2018-07-22 (×2): 325 mg via ORAL
  Filled 2018-07-20 (×2): qty 1

## 2018-07-20 MED ORDER — METOPROLOL TARTRATE 25 MG PO TABS
25.0000 mg | ORAL_TABLET | Freq: Two times a day (BID) | ORAL | Status: DC
  Administered 2018-07-21 – 2018-07-22 (×3): 25 mg via ORAL
  Filled 2018-07-20 (×3): qty 1

## 2018-07-20 MED ORDER — FOLIC ACID 1 MG PO TABS
1.0000 mg | ORAL_TABLET | Freq: Every day | ORAL | Status: DC
  Administered 2018-07-21 – 2018-07-22 (×2): 1 mg via ORAL
  Filled 2018-07-20 (×2): qty 1

## 2018-07-20 MED ORDER — FERROUS SULFATE 325 (65 FE) MG PO TABS
325.0000 mg | ORAL_TABLET | Freq: Every day | ORAL | Status: DC
  Administered 2018-07-21: 325 mg via ORAL
  Filled 2018-07-20: qty 1

## 2018-07-20 MED ORDER — PANTOPRAZOLE SODIUM 40 MG PO TBEC
40.0000 mg | DELAYED_RELEASE_TABLET | Freq: Every day | ORAL | Status: DC
  Administered 2018-07-21 – 2018-07-22 (×2): 40 mg via ORAL
  Filled 2018-07-20 (×2): qty 1

## 2018-07-20 MED ORDER — CIPROFLOXACIN HCL 500 MG PO TABS
500.0000 mg | ORAL_TABLET | Freq: Two times a day (BID) | ORAL | Status: DC
  Administered 2018-07-21: 500 mg via ORAL
  Filled 2018-07-20: qty 1

## 2018-07-20 MED ORDER — LORAZEPAM 2 MG/ML IJ SOLN
0.5000 mg | Freq: Once | INTRAMUSCULAR | Status: AC | PRN
  Administered 2018-07-20: 0.5 mg via INTRAVENOUS
  Filled 2018-07-20: qty 1

## 2018-07-20 MED ORDER — ENOXAPARIN SODIUM 40 MG/0.4ML ~~LOC~~ SOLN
40.0000 mg | SUBCUTANEOUS | Status: DC
  Administered 2018-07-21: 40 mg via SUBCUTANEOUS
  Filled 2018-07-20: qty 0.4

## 2018-07-20 MED ORDER — PRAVASTATIN SODIUM 40 MG PO TABS
20.0000 mg | ORAL_TABLET | Freq: Every day | ORAL | Status: DC
  Administered 2018-07-21: 20 mg via ORAL
  Filled 2018-07-20: qty 1

## 2018-07-20 MED ORDER — ACETAMINOPHEN 325 MG PO TABS
650.0000 mg | ORAL_TABLET | Freq: Four times a day (QID) | ORAL | Status: DC | PRN
  Administered 2018-07-21 (×2): 650 mg via ORAL
  Filled 2018-07-20 (×2): qty 2

## 2018-07-20 NOTE — Telephone Encounter (Signed)
Patient's son, who is a Engineer, civil (consulting), contacted the office concerned about his father.  He stated that he has become increasingly confused today to the point he did not know where or how he got where he was and would be afraid to leave his dad by himself.  He stated that he was not having any chest pain/ shortness of breath.  He does not have a temperature.  Vitals are within normal limits and has lost weight, not gained any.  He stated that [place] to the mall and walked but asked several times very inconsistent questions which prompted him to contact the office.  I advised to take him to the nearest ED to have him checked out.  He acknowledged receipt.

## 2018-07-20 NOTE — ED Notes (Signed)
Pt premedicated for MRI, pt transported via stretcher to MRI, family at bedside.

## 2018-07-20 NOTE — ED Notes (Signed)
EDP at bedside speaking with family. Neuro consult

## 2018-07-20 NOTE — ED Provider Notes (Signed)
[facility] EMERGENCY DEPARTMENT Provider Note   CSN: [id number] Arrival date & time: 07/20/18  1448     History   Chief Complaint Chief Complaint  Patient presents with  . Altered Mental Status    HPI Daniel Mann is a 67 y.o. male.  67 year old male with prior medical history as detailed below presents for evaluation of confusion.  Patient's son provides the majority of history.  Patient son is an Charity fundraiser who works here at [employer].  Patient status post recent CABG and aortic valve replacement.  He was discharged from hospital on Monday.  He presents today with mild confusion.  The son reports that there have been several instances where his father seems to be confused.  He cannot remember where his car was parked.  He could not remember the exact month.  This is unusual for the patient.  The son has not noticed any focal deficit.  No reported fever.  Patient denies current chest pain, shortness of breath, nausea, vomiting, vision change, weakness, or other specific complaint.  Patient does endorse a mild frontal headache.  He reports that he feels like maybe he is getting a sinus infection.  There is no reported cough, congestion, or URI symptoms.   Symptoms were first noted earlier today (shortly before noon) by the Son.  The history is provided by the patient, medical records and a relative.  Altered Mental Status   This is a new problem. The current episode started 6 to 12 hours ago. The problem has not changed since onset.Associated symptoms include confusion.    Past Medical History:  Diagnosis Date  . Aortic stenosis   . CHF (congestive heart failure) (HCC)   . GERD (gastroesophageal reflux disease)   . Heart murmur   . Hernia, inguinal, right   . Hyperlipidemia   . Hypertension   . Impaired fasting glucose   . Mixed hyperlipidemia   . Vitamin D deficiency     Patient Active Problem List   Diagnosis Date Noted  . 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 S/P  aortic valve replacement with bioprosthetic valve 07/12/2018  . Coronary artery disease 07/12/2018  . Severe aortic stenosis 07/01/2018  . Hypertension   . Heart murmur   . GERD (gastroesophageal reflux disease)   . Hernia, inguinal, right   . Impaired fasting glucose   . Vitamin D deficiency   . Mixed hyperlipidemia     Past Surgical History:  Procedure Laterality Date  . AORTIC VALVE REPLACEMENT N/A 07/12/2018   Procedure: AORTIC VALVE REPLACEMENT (AVR) using an Inspiris Valve size  23;  Surgeon: [name], MD;  Location: [facility] OR;  Service: Open Heart Surgery;  Laterality: N/A;  . COLONOSCOPY    . CORONARY ARTERY BYPASS GRAFT N/A 07/12/2018   Procedure: CORONARY ARTERY BYPASS GRAFTING (CABG) times three using left internal mammary artery and right greater saphenous vein harvested endoscopically.;  Surgeon: [name], MD;  Location: [facility] OR;  Service: Open Heart Surgery;  Laterality: N/A;  . RIGHT/LEFT HEART CATH AND CORONARY ANGIOGRAPHY N/A 07/02/2018   Procedure: RIGHT/LEFT HEART CATH AND CORONARY ANGIOGRAPHY;  Surgeon: Lyn Records, MD;  Location: [facility] INVASIVE CV LAB;  Service: Cardiovascular;  Laterality: N/A;  . TEE WITHOUT CARDIOVERSION N/A 07/12/2018   Procedure: TRANSESOPHAGEAL ECHOCARDIOGRAM (TEE);  Surgeon: [name], MD;  Location: [facility] OR;  Service: Open Heart Surgery;  Laterality: N/A;        Home Medications    Prior  to Admission medications   Medication Sig Start Date End Date Taking? Authorizing Provider  acetaminophen (TYLENOL) 325 MG tablet Take 2 tablets (650 mg total) by mouth every 6 (six) hours as needed for mild pain. 07/16/18   Barrett, Rae Roam, PA-C  aspirin EC 325 MG EC tablet Take 1 tablet (325 mg total) by mouth daily. 07/16/18   Barrett, Erin R, PA-C  ciprofloxacin (CIPRO) 500 MG tablet Take 500 mg by mouth 2 (two) times daily as needed (chronic prostatitis).     [provider]  co-enzyme Q-10 30 MG capsule Take 30 mg by mouth  daily.     [provider]  ergocalciferol (VITAMIN D2) 50000 UNITS capsule Take 50,000 Units by mouth 2 (two) times a week. Saturdays & Wednesdays.    [provider]  esomeprazole (NEXIUM) 40 MG capsule Take 40 mg by mouth daily before breakfast.    [provider]  ferrous sulfate 325 (65 FE) MG tablet Take 1 tablet (325 mg total) by mouth daily with lunch. 07/16/18   Barrett, Rae Roam, PA-C  folic acid (FOLVITE) 1 MG tablet Take 1 tablet (1 mg total) by mouth daily. 07/16/18   Barrett, Erin R, PA-C  furosemide (LASIX) 40 MG tablet Take 1 tablet (40 mg total) by mouth daily for 5 days. 07/17/18 07/22/18  Barrett, Erin R, PA-C  metoprolol tartrate (LOPRESSOR) 25 MG tablet Take 1 tablet (25 mg total) by mouth 2 (two) times daily. 07/16/18   Barrett, Erin R, PA-C  oxyCODONE (OXY IR/ROXICODONE) 5 MG immediate release tablet Take 1-2 tablets (5-10 mg total) by mouth every 4 (four) hours as needed for severe pain. 07/16/18   Barrett, Rae Roam, PA-C  Pitavastatin Calcium (LIVALO) 1 MG TABS Take 1 mg by mouth daily.     [provider]  potassium chloride SA (K-DUR,KLOR-CON) 20 MEQ tablet Take 1 tablet (20 mEq total) by mouth daily for 5 days. 07/17/18 07/22/18  Barrett, Rae Roam, PA-C    Family History Family History  Problem Relation Age of Onset  . Alzheimer's disease Mother   . Renal Disease Father   . Cancer - Prostate Father     Social History Social History   Tobacco Use  . Smoking status: Former Games developer  . Smokeless tobacco: Never Used  Substance Use Topics  . Alcohol use: Yes    Comment: WINE   . Drug use: No     Allergies   Patient has no known allergies.   Review of Systems Review of Systems  Psychiatric/Behavioral: Positive for confusion.  All other systems reviewed and are negative.    Physical Exam Updated Vital Signs BP 135/76 (BP Location: Right Arm)   Pulse 87   Temp 98.1 F (36.7 C) (Oral)   Resp 18   SpO2 94%   Physical Exam    Constitutional: He is oriented to person, place, and time. He appears well-developed and well-nourished. No distress.  HENT:  Head: Normocephalic and atraumatic.  Mouth/Throat: Oropharynx is clear and moist.  Eyes: Pupils are equal, round, and reactive to light. Conjunctivae and EOM are normal.  Neck: Normal range of motion. Neck supple.  Cardiovascular: Normal rate, regular rhythm and normal heart sounds.  Healing incisions to the anterior chest wall.  No surrounding erythema.  No dehiscence.  No drainage.  Pulmonary/Chest: Effort normal and breath sounds normal. No respiratory distress.  Abdominal: Soft. He exhibits no distension. There is no tenderness.  Musculoskeletal: Normal range of motion. He exhibits no  edema or deformity.  Neurological: He is alert and oriented to person, place, and time.  Skin: Skin is warm and dry.  Psychiatric: He has a normal mood and affect.  Nursing note and vitals reviewed.    ED Treatments / Results  Labs (all labs ordered are listed, but only abnormal results are displayed) Labs Reviewed  COMPREHENSIVE METABOLIC PANEL - Abnormal; Notable for the following components:      Result Value   Glucose, Bld 115 (*)    Total Protein 5.7 (*)    Albumin 3.1 (*)    Anion gap 3 (*)    All other components within normal limits  CBC WITH DIFFERENTIAL/PLATELET - Abnormal; Notable for the following components:   WBC 11.3 (*)    RBC 3.07 (*)    Hemoglobin 9.4 (*)    HCT 29.4 (*)    Neutro Abs 8.5 (*)    Abs Immature Granulocytes 0.08 (*)    All other components within normal limits  CBG MONITORING, ED - Abnormal; Notable for the following components:   Glucose-Capillary 102 (*)    All other components within normal limits  I-STAT TROPONIN, ED - Abnormal; Notable for the following components:   Troponin i, poc 0.12 (*)    All other components within normal limits  URINALYSIS, ROUTINE W REFLEX MICROSCOPIC  PROTIME-INR  I-STAT CG4 LACTIC ACID, ED     EKG EKG Interpretation  Date/Time:  Friday July 20 2018 15:04:44 EST Ventricular Rate:  89 PR Interval:    QRS Duration: 93 QT Interval:  348 QTC Calculation: 424 R Axis:   58 Text Interpretation:  Sinus rhythm Borderline low voltage, extremity leads Confirmed by Kristine Royal [phone]) on 07/20/2018 3:07:25 PM   Radiology Dg Chest 2 View  Result Date: 07/20/2018 CLINICAL DATA:  Confusion weakness. Chest pain and shortness-of-breath. EXAM: CHEST - 2 VIEW COMPARISON:  07/16/2018 FINDINGS: Sternotomy wires are unchanged. Lungs are hypoinflated with stable mild left base/retrocardiac opacification which may be due to atelectasis or infection. No significant effusion. Mild stable cardiomegaly. Remainder of the exam is unchanged. IMPRESSION: Stable left base/retrocardiac opacification which may be due to atelectasis or infection. Mild stable cardiomegaly. Electronically Signed   By: Elberta Fortis M.D.   On: 07/20/2018 16:44   Ct Head Wo Contrast  Result Date: 07/20/2018 CLINICAL DATA:  Altered level of consciousness. Acute onset of confusion. EXAM: CT HEAD WITHOUT CONTRAST TECHNIQUE: Contiguous axial images were obtained from the base of the skull through the vertex without intravenous contrast. COMPARISON:  None. FINDINGS: Brain: Focal hypoattenuation in the subcortical right parietal lobe is consistent with an age indeterminate white matter infarct. No other significant white matter disease is present. Basal ganglia are intact. Insular ribbon is normal. No other acute or focal cortical lesions are present. The brainstem and cerebellum are within normal limits. Vascular: No hyperdense vessel or unexpected calcification. Skull: Calvarium is intact. No focal lytic or blastic lesions are present. Sinuses/Orbits: The paranasal sinuses and mastoid air cells are clear. The globes and orbits are within normal limits. IMPRESSION: 1. Right parietal subcortical white matter hypoattenuation likely  represents focal small vessel disease. No associated cortical infarct is present. 2. Otherwise normal CT of the head. Electronically Signed   By: Marin Roberts M.D.   On: 07/20/2018 18:28   Mr Brain Wo Contrast (neuro Protocol)  Result Date: 07/20/2018 CLINICAL DATA:  Increasing confusion. History of hypertension and hyperlipidemia. EXAM: MRI HEAD WITHOUT CONTRAST TECHNIQUE: Multiplanar, multiecho pulse sequences of the brain  and surrounding structures were obtained without intravenous contrast. COMPARISON:  CT HEAD July 20, 2018 FINDINGS: INTRACRANIAL CONTENTS: Patchy biparietal reduced diffusion, subcentimeter reduced diffusion LEFT frontal lobe. Normalized ADC values. No susceptibility artifact to suggest hemorrhage. The ventricles and sulci are normal for patient's age. A few scattered subcentimeter supratentorial white matter FLAIR T2 hyperintensities exclusive aforementioned abnormality compatible with early chronic small vessel ischemic changes, less than expected for age. No suspicious parenchymal signal, masses, mass effect. No abnormal extra-axial fluid collections. No extra-axial masses. VASCULAR: Normal major intracranial vascular flow voids present at skull base. SKULL AND UPPER CERVICAL SPINE: No abnormal sellar expansion. No suspicious calvarial bone marrow signal. Craniocervical junction maintained. SINUSES/ORBITS: The mastoid air-cells and included paranasal sinuses are well-aerated.The included ocular globes and orbital contents are non-suspicious. OTHER: None. IMPRESSION: 1. Subacute small bilateral parietal and LEFT frontal MCA versus posterior watershed territory infarcts. 2. Otherwise negative noncontrast MRI head for age. Electronically Signed   By: Awilda Metro M.D.   On: 07/20/2018 22:13    Procedures Procedures (including critical care time) CRITICAL CARE Performed by: Wynetta Fines   Total critical care time: 30 minutes  Critical care time was exclusive of  separately billable procedures and treating other patients.  Critical care was necessary to treat or prevent imminent or life-threatening deterioration.  Critical care was time spent personally by me on the following activities: development of treatment plan with patient and/or surrogate as well as nursing, discussions with consultants, evaluation of patient's response to treatment, examination of patient, obtaining history from patient or surrogate, ordering and performing treatments and interventions, ordering and review of laboratory studies, ordering and review of radiographic studies, pulse oximetry and re-evaluation of patient's condition.   Medications Ordered in ED Medications - No data to display   Initial Impression / Assessment and Plan / ED Course  I have reviewed the triage vital signs and the nursing notes.  Pertinent labs & imaging results that were available during my care of the patient were reviewed by me and considered in my medical decision making (see chart for details).     MDM  Screen complete  Patient is presenting for evaluation of his altered mental status and confusion.  Patient with recent cardiac surgery.  Symptoms do not have a specific timeframe.  His MRI suggests subacute infarct. Patient does not meet criteria for TPA.  Dr. Otelia Limes is aware of case and will consult.   Hospitalist service is aware of case and will evaluate for admission.    Final Clinical Impressions(s) / ED Diagnoses   Final diagnoses:  Cerebrovascular accident (CVA), unspecified mechanism [facility])    ED Discharge Orders    None       Wynetta Fines, MD 07/20/18 [phone]

## 2018-07-20 NOTE — ED Triage Notes (Signed)
Pt arrives via POV with son with confusion onset today, per son pt is repeating statements, unable to report month and does not recall conversations he's had today with his son. Pt is alert to self and time.

## 2018-07-20 NOTE — ED Notes (Signed)
Pt resting on stretcher, NAD, family at bedside. Per MRI pt has one patient ahead of him, MRI will notify this RN in time for ativan administration.

## 2018-07-20 NOTE — H&P (Addendum)
History and Physical    Daniel Mann ZOX:[id number] DOB: March 17, 1951 DOA: 07/20/2018  PCP: Blane Ohara, MD Patient coming from: Home  Chief Complaint: Altered mental status  HPI: Daniel Mann is a 67 y.o. male with medical history significant of CHF, GERD, hypertension, hyperlipidemia, aortic stenosis, recent CABG and aortic valve replacement presenting to the hospital for evaluation of altered mental status.  Per family at bedside, patient was doing well since his hospital discharge a few days ago but then became acutely confused today. They believe he is having problems with his short-term memory.  Son states he took the patient out to a restaurant but he could not remember where their car was parked and did not remember they had ordered food.  States patient did not remember that he recently had a surgery.  Family did not notice any slurring of speech, facial droop, or focal weakness.  Patient states he has been having a slight frontal headache today.  Denies having any chest pain or shortness of breath.  States other than Tylenol he has not taken any other pain medications at home.  Patient was recently admitted and underwent CABG and aortic valve replacement on July 12, 2018.  He was discharged on July 16, 2018.  ED Course: Vitals stable on arrival.  CBG 102.  White count 11.3, improved since recent hospitalization (12.4).  Lactic acid normal.  UA not suggestive of infection.  I-STAT troponin 0.12.  EKG not suggestive of ACS.  Chest x-ray showing mild stable cardiomegaly and stable left base/retrocardiac opacification which may be due to atelectasis or infection.  CT head showing focal small vessel disease and no acute abnormality.  MRI brain showing subacute small bilateral parietal and left frontal MCA versus posterior watershed territory infarcts.  ED physician discussed the case with Dr. Otelia Limes, neurology will see the patient.  Review of Systems: As per HPI otherwise 10 point review of  systems negative.  Past Medical History:  Diagnosis Date  . Aortic stenosis   . CHF (congestive heart failure) (HCC)   . GERD (gastroesophageal reflux disease)   . Heart murmur   . Hernia, inguinal, right   . Hyperlipidemia   . Hypertension   . Impaired fasting glucose   . Mixed hyperlipidemia   . Vitamin D deficiency     Past Surgical History:  Procedure Laterality Date  . AORTIC VALVE REPLACEMENT N/A 07/12/2018   Procedure: AORTIC VALVE REPLACEMENT (AVR) using an Inspiris Valve size  23;  Surgeon: [name], MD;  Location: [facility] OR;  Service: Open Heart Surgery;  Laterality: N/A;  . COLONOSCOPY    . CORONARY ARTERY BYPASS GRAFT N/A 07/12/2018   Procedure: CORONARY ARTERY BYPASS GRAFTING (CABG) times three using left internal mammary artery and right greater saphenous vein harvested endoscopically.;  Surgeon: [name], MD;  Location: [facility] OR;  Service: Open Heart Surgery;  Laterality: N/A;  . RIGHT/LEFT HEART CATH AND CORONARY ANGIOGRAPHY N/A 07/02/2018   Procedure: RIGHT/LEFT HEART CATH AND CORONARY ANGIOGRAPHY;  Surgeon: Lyn Records, MD;  Location: [facility] INVASIVE CV LAB;  Service: Cardiovascular;  Laterality: N/A;  . TEE WITHOUT CARDIOVERSION N/A 07/12/2018   Procedure: TRANSESOPHAGEAL ECHOCARDIOGRAM (TEE);  Surgeon: [name], MD;  Location: [facility] OR;  Service: Open Heart Surgery;  Laterality: N/A;     reports that he has quit smoking. He has never used smokeless tobacco. He reports that he drinks alcohol. He reports that he does not use drugs.  No Known Allergies  Family History  Problem Relation Age of Onset  . Alzheimer's disease Mother   . Renal Disease Father   . Cancer - Prostate Father     Prior to Admission medications   Medication Sig Start Date End Date Taking? Authorizing Provider  acetaminophen (TYLENOL) 325 MG tablet Take 2 tablets (650 mg total) by mouth every 6 (six) hours as needed for mild pain. 07/16/18  Yes Barrett, Rae Roam, PA-C  aspirin EC  325 MG EC tablet Take 1 tablet (325 mg total) by mouth daily. 07/16/18  Yes Barrett, Erin R, PA-C  ciprofloxacin (CIPRO) 500 MG tablet Take 500 mg by mouth 2 (two) times daily. Chronic prostatitis   Yes [provider]  ergocalciferol (VITAMIN D2) 50000 UNITS capsule Take 50,000 Units by mouth 2 (two) times a week. Saturdays & Wednesdays.   Yes [provider]  esomeprazole (NEXIUM) 40 MG capsule Take 40 mg by mouth daily before breakfast.   Yes [provider]  ferrous sulfate 325 (65 FE) MG tablet Take 1 tablet (325 mg total) by mouth daily with lunch. 07/16/18  Yes Barrett, Erin R, PA-C  folic acid (FOLVITE) 1 MG tablet Take 1 tablet (1 mg total) by mouth daily. 07/16/18  Yes Barrett, Erin R, PA-C  furosemide (LASIX) 40 MG tablet Take 1 tablet (40 mg total) by mouth daily for 5 days. 07/17/18 07/22/18 Yes Barrett, Erin R, PA-C  metoprolol tartrate (LOPRESSOR) 25 MG tablet Take 1 tablet (25 mg total) by mouth 2 (two) times daily. 07/16/18  Yes Barrett, Erin R, PA-C  Pitavastatin Calcium (LIVALO) 1 MG TABS Take 1 mg by mouth daily.    Yes [provider]  potassium chloride SA (K-DUR,KLOR-CON) 20 MEQ tablet Take 1 tablet (20 mEq total) by mouth daily for 5 days. 07/17/18 07/22/18 Yes Barrett, Erin R, PA-C  oxyCODONE (OXY IR/ROXICODONE) 5 MG immediate release tablet Take 1-2 tablets (5-10 mg total) by mouth every 4 (four) hours as needed for severe pain. Patient not taking: Reported on 07/20/2018 07/16/18   Zada Girt    Physical Exam: Vitals:   07/20/18 2000 07/20/18 2200 07/20/18 2240 07/20/18 2301  BP: 112/60 110/90 (!) 111/56 115/67  Pulse: 83 84 82 84  Resp: (!) 22 (!) 21 (!) 23 (!) 22  Temp:      TempSrc:      SpO2: 96% 98% 95% 100%    Physical Exam  Constitutional: He is oriented to person, place, and time. He appears well-developed and well-nourished. No distress.  Resting comfortably in a hospital stretcher  HENT:  Head: Normocephalic and  atraumatic.  Mouth/Throat: Oropharynx is clear and moist.  Eyes: Pupils are equal, round, and reactive to light. Right eye exhibits no discharge. Left eye exhibits no discharge.  Neck: Neck supple. No tracheal deviation present.  Cardiovascular: Normal rate, regular rhythm and intact distal pulses.  Pulmonary/Chest: Effort normal. No respiratory distress. He has no wheezes. He has rales.  Rales at the left lung base  Abdominal: Soft. Bowel sounds are normal. He exhibits no distension. There is no tenderness.  Musculoskeletal: He exhibits edema.  +1 b/l pedal edema  Neurological: He is alert and oriented to person, place, and time.  No facial droop Tongue midline No slurring of speech Strength 5 out of 5 in bilateral upper and lower extremities Sensation to light touch intact throughout  Skin: Skin is warm and dry. He is not diaphoretic.  Psychiatric: He has a normal mood and affect.  Talkative  Labs on Admission: I have personally reviewed following labs and imaging studies  CBC: Recent Labs  Lab 07/14/18 0418 07/15/18 0222 07/20/18 1538  WBC 12.9* 12.4* 11.3*  NEUTROABS  --   --  8.5*  HGB 8.5* 8.3* 9.4*  HCT 26.7* 26.7* 29.4*  MCV 96.7 97.1 95.8  PLT 136* 149* 185   Basic Metabolic Panel: Recent Labs  Lab 07/14/18 0418 07/15/18 0222 07/20/18 1538  NA 133* 134* 137  K 4.4 4.2 4.6  CL 104 101 106  CO2 26 29 28   GLUCOSE 131* 124* 115*  BUN 11 9 14   CREATININE 0.63 0.61 0.96  CALCIUM 9.1 9.4 9.9   GFR: Estimated Creatinine Clearance: 91.8 mL/min (by C-G formula based on SCr of 0.96 mg/dL). Liver Function Tests: Recent Labs  Lab 07/20/18 1538  AST 25  ALT 32  ALKPHOS 54  BILITOT 0.8  PROT 5.7*  ALBUMIN 3.1*   No results for input(s): LIPASE, AMYLASE in the last 168 hours. No results for input(s): AMMONIA in the last 168 hours. Coagulation Profile: Recent Labs  Lab 07/20/18 1538  INR 1.20   Cardiac Enzymes: No results for input(s): CKTOTAL,  CKMB, CKMBINDEX, TROPONINI in the last 168 hours. BNP (last 3 results) No results for input(s): PROBNP in the last 8760 hours. HbA1C: No results for input(s): HGBA1C in the last 72 hours. CBG: Recent Labs  Lab 07/14/18 0339 07/14/18 0842 07/14/18 1549 07/20/18 1556  GLUCAP 127* 135* 152* 102*   Lipid Profile: No results for input(s): CHOL, HDL, LDLCALC, TRIG, CHOLHDL, LDLDIRECT in the last 72 hours. Thyroid Function Tests: No results for input(s): TSH, T4TOTAL, FREET4, T3FREE, THYROIDAB in the last 72 hours. Anemia Panel: No results for input(s): VITAMINB12, FOLATE, FERRITIN, TIBC, IRON, RETICCTPCT in the last 72 hours. Urine analysis:    Component Value Date/Time   COLORURINE YELLOW 07/20/2018 1523   APPEARANCEUR CLEAR 07/20/2018 1523   LABSPEC 1.016 07/20/2018 1523   PHURINE 6.0 07/20/2018 1523   GLUCOSEU NEGATIVE 07/20/2018 1523   HGBUR NEGATIVE 07/20/2018 1523   BILIRUBINUR NEGATIVE 07/20/2018 1523   KETONESUR NEGATIVE 07/20/2018 1523   PROTEINUR NEGATIVE 07/20/2018 1523   NITRITE NEGATIVE 07/20/2018 1523   LEUKOCYTESUR NEGATIVE 07/20/2018 1523    Radiological Exams on Admission: Dg Chest 2 View  Result Date: 07/20/2018 CLINICAL DATA:  Confusion weakness. Chest pain and shortness-of-breath. EXAM: CHEST - 2 VIEW COMPARISON:  07/16/2018 FINDINGS: Sternotomy wires are unchanged. Lungs are hypoinflated with stable mild left base/retrocardiac opacification which may be due to atelectasis or infection. No significant effusion. Mild stable cardiomegaly. Remainder of the exam is unchanged. IMPRESSION: Stable left base/retrocardiac opacification which may be due to atelectasis or infection. Mild stable cardiomegaly. Electronically Signed   By: Elberta Fortis M.D.   On: 07/20/2018 16:44   Ct Head Wo Contrast  Result Date: 07/20/2018 CLINICAL DATA:  Altered level of consciousness. Acute onset of confusion. EXAM: CT HEAD WITHOUT CONTRAST TECHNIQUE: Contiguous axial images were  obtained from the base of the skull through the vertex without intravenous contrast. COMPARISON:  None. FINDINGS: Brain: Focal hypoattenuation in the subcortical right parietal lobe is consistent with an age indeterminate white matter infarct. No other significant white matter disease is present. Basal ganglia are intact. Insular ribbon is normal. No other acute or focal cortical lesions are present. The brainstem and cerebellum are within normal limits. Vascular: No hyperdense vessel or unexpected calcification. Skull: Calvarium is intact. No focal lytic or blastic lesions are present. Sinuses/Orbits: The paranasal sinuses and  mastoid air cells are clear. The globes and orbits are within normal limits. IMPRESSION: 1. Right parietal subcortical white matter hypoattenuation likely represents focal small vessel disease. No associated cortical infarct is present. 2. Otherwise normal CT of the head. Electronically Signed   By: Marin Roberts M.D.   On: 07/20/2018 18:28   Mr Brain Wo Contrast (neuro Protocol)  Result Date: 07/20/2018 CLINICAL DATA:  Increasing confusion. History of hypertension and hyperlipidemia. EXAM: MRI HEAD WITHOUT CONTRAST TECHNIQUE: Multiplanar, multiecho pulse sequences of the brain and surrounding structures were obtained without intravenous contrast. COMPARISON:  CT HEAD July 20, 2018 FINDINGS: INTRACRANIAL CONTENTS: Patchy biparietal reduced diffusion, subcentimeter reduced diffusion LEFT frontal lobe. Normalized ADC values. No susceptibility artifact to suggest hemorrhage. The ventricles and sulci are normal for patient's age. A few scattered subcentimeter supratentorial white matter FLAIR T2 hyperintensities exclusive aforementioned abnormality compatible with early chronic small vessel ischemic changes, less than expected for age. No suspicious parenchymal signal, masses, mass effect. No abnormal extra-axial fluid collections. No extra-axial masses. VASCULAR: Normal major  intracranial vascular flow voids present at skull base. SKULL AND UPPER CERVICAL SPINE: No abnormal sellar expansion. No suspicious calvarial bone marrow signal. Craniocervical junction maintained. SINUSES/ORBITS: The mastoid air-cells and included paranasal sinuses are well-aerated.The included ocular globes and orbital contents are non-suspicious. OTHER: None. IMPRESSION: 1. Subacute small bilateral parietal and LEFT frontal MCA versus posterior watershed territory infarcts. 2. Otherwise negative noncontrast MRI head for age. Electronically Signed   By: Awilda Metro M.D.   On: 07/20/2018 22:13    EKG: Independently reviewed.  Sinus rhythm-heart rate 89, baseline wander.  Assessment/Plan Principal Problem:   Acute encephalopathy Active Problems:   Hypertension   GERD (gastroesophageal reflux disease)   Mixed hyperlipidemia   S/P CABG x 3   Coronary artery disease   Elevated troponin   Anemia   Chronic prostatitis  Acute encephalopathy Presenting with acute onset confusion and short-term memory loss per family.  At present, patient does not appear confused.  CBG 102.  Nontoxic-appearing.  White count 11.3, improved since recent hospitalization (12.4).  Lactic acid normal.  UA not suggestive of infection. MRI brain showing subacute small bilateral parietal and left frontal MCA versus posterior watershed territory infarcts.  ED physician discussed the case with Dr. Otelia Limes, neurology will see the patient. -Neurology recommendations pending -Check B12, TSH level   ?PNA Chest x-ray showing mild stable cardiomegaly and stable left base/retrocardiac opacification which may be due to atelectasis or infection.  Not hypoxic.  White count 11.3, improved since recent hospitalization (12.4).  Lactic acid normal.  -Check pro calcitonin level  Troponinemia Recent CABG and aortic valve replacement on July 12, 2018.  Patient is not complaining of chest pain or shortness of breath.  He appears  comfortable on exam and in no distress.  I-STAT troponin 0.12.  EKG not suggestive of ACS.  -Continue to trend troponin -Consult cardiology if troponin continues to trend up  Coronary artery disease status post recent CABG -Continue to trend troponin as mentioned above -Continue home aspirin, metoprolol  Anemia -In the setting of recent CABG and aortic valve replacement. -Stable.  Hemoglobin 9.4, improved since recent hospitalization (8.3).  -Continue home iron supplement, folic acid supplement  Hypertension -Currently normotensive -Continue home metoprolol  Hyperlipidemia -Continue home statin  Chronic prostatitis -Continue home Cipro  GERD -Continue PPI  DVT prophylaxis: Lovenox Code Status: Patient wishes to be full code. Family Communication: Son and daughter at bedside. Disposition Plan: Anticipate discharge to home  in 1 to 2 days. Consults called: Neurology-Dr. Otelia Limes Admission status: Observation   John Giovanni MD [facility] Pager [phone]  If 7PM-7AM, please contact night-coverage [email or web address] Password [email or web address]  07/21/2018, 12:09 AM

## 2018-07-21 ENCOUNTER — Other Ambulatory Visit: Payer: Self-pay

## 2018-07-21 DIAGNOSIS — D649 Anemia, unspecified: Secondary | ICD-10-CM

## 2018-07-21 DIAGNOSIS — N411 Chronic prostatitis: Secondary | ICD-10-CM

## 2018-07-21 DIAGNOSIS — R7989 Other specified abnormal findings of blood chemistry: Secondary | ICD-10-CM | POA: Diagnosis not present

## 2018-07-21 DIAGNOSIS — I639 Cerebral infarction, unspecified: Secondary | ICD-10-CM

## 2018-07-21 DIAGNOSIS — R413 Other amnesia: Secondary | ICD-10-CM | POA: Diagnosis not present

## 2018-07-21 DIAGNOSIS — G9340 Encephalopathy, unspecified: Secondary | ICD-10-CM | POA: Diagnosis not present

## 2018-07-21 DIAGNOSIS — R778 Other specified abnormalities of plasma proteins: Secondary | ICD-10-CM

## 2018-07-21 DIAGNOSIS — Z951 Presence of aortocoronary bypass graft: Secondary | ICD-10-CM | POA: Diagnosis not present

## 2018-07-21 LAB — TROPONIN I
TROPONIN I: 0.11 ng/mL — AB (ref <0.03)
TROPONIN I: 0.1 ng/mL — AB (ref <0.03)

## 2018-07-21 LAB — AMMONIA: Ammonia: 15 umol/L (ref 9–35)

## 2018-07-21 LAB — TSH: TSH: 2.017 u[IU]/mL (ref 0.350–4.500)

## 2018-07-21 LAB — VITAMIN B12: VITAMIN B 12: 787 pg/mL (ref 180–914)

## 2018-07-21 LAB — HIV ANTIBODY (ROUTINE TESTING W REFLEX): HIV SCREEN 4TH GENERATION: NONREACTIVE

## 2018-07-21 LAB — PROCALCITONIN: Procalcitonin: <0.1 ng/mL

## 2018-07-21 MED ORDER — GUAIFENESIN ER 600 MG PO TB12
600.0000 mg | ORAL_TABLET | Freq: Two times a day (BID) | ORAL | Status: DC
  Administered 2018-07-21 – 2018-07-22 (×3): 600 mg via ORAL
  Filled 2018-07-21 (×3): qty 1

## 2018-07-21 MED ORDER — SODIUM CHLORIDE 0.9 % IV SOLN
INTRAVENOUS | Status: DC
  Administered 2018-07-21 – 2018-07-22 (×2): via INTRAVENOUS

## 2018-07-21 NOTE — Progress Notes (Signed)
CRITICAL VALUE ALERT  Critical Value:  Troponin 0.11  Date & Time Notied: 07/21/2018, 0232  Provider Notified: Paged Blount,NP  Orders Received/Actions taken:  awaiting

## 2018-07-21 NOTE — Progress Notes (Signed)
Paged NP on critical Troponin level

## 2018-07-21 NOTE — Consult Note (Signed)
Referring Physician: Dr. Eliseo Squires    Chief Complaint: AMS  HPI: Daniel Mann is an 67 y.o. male who presented with AMS. MRI revealed strokes in separate vascular territories, concerning for cardioembolic phenomenon; the strokes were well-defined on the FLAIR images, suggestive of late subacute stage of evolution. He had recent CABG and aortic valve replacement on 07/12/18. Taking the above history in conjunction with the MRI findings, his strokes may have been perioperative, due to microemboli. His AMS may be due to an alternative etiology.   His PMHx includes HTN, HLD, CHF and GERD.   Further information regarding his AMS is obtained from the chart:  "They (family) believe he is having problems with his short-term memory.  Son states he took the patient out to a restaurant but he could not remember where their car was parked and did not remember they had ordered food.  States patient did not remember that he recently had a surgery.  Family did not notice any slurring of speech, facial droop, or focal weakness. Patient states he has been having a slight frontal headache today.  Denies having any chest pain or shortness of breath.  States other than Tylenol he has not taken any other pain medications at home."   Past Medical History:  Diagnosis Date  . Aortic stenosis   . CHF (congestive heart failure) ([place])   . GERD (gastroesophageal reflux disease)   . Heart murmur   . Hernia, inguinal, right   . Hyperlipidemia   . Hypertension   . Impaired fasting glucose   . Mixed hyperlipidemia   . Vitamin D deficiency     Past Surgical History:  Procedure Laterality Date  . AORTIC VALVE REPLACEMENT N/A 07/12/2018   Procedure: AORTIC VALVE REPLACEMENT (AVR) using an Inspiris Valve size  23;  Surgeon: Gaye Pollack, MD;  Location: [facility] OR;  Service: Open Heart Surgery;  Laterality: N/A;  . COLONOSCOPY    . CORONARY ARTERY BYPASS GRAFT N/A 07/12/2018   Procedure: CORONARY ARTERY BYPASS GRAFTING (CABG)  times three using left internal mammary artery and right greater saphenous vein harvested endoscopically.;  Surgeon: Gaye Pollack, MD;  Location: [facility] OR;  Service: Open Heart Surgery;  Laterality: N/A;  . RIGHT/LEFT HEART CATH AND CORONARY ANGIOGRAPHY N/A 07/02/2018   Procedure: RIGHT/LEFT HEART CATH AND CORONARY ANGIOGRAPHY;  Surgeon: Belva Crome, MD;  Location: [facility];  Service: Cardiovascular;  Laterality: N/A;  . TEE WITHOUT CARDIOVERSION N/A 07/12/2018   Procedure: TRANSESOPHAGEAL ECHOCARDIOGRAM (TEE);  Surgeon: Gaye Pollack, MD;  Location: [facility];  Service: Open Heart Surgery;  Laterality: N/A;    Family History  Problem Relation Age of Onset  . Alzheimer's disease Mother   . Renal Disease Father   . Cancer - Prostate Father    Social History:  reports that he has quit smoking. He has never used smokeless tobacco. He reports that he drinks alcohol. He reports that he does not use drugs.  Allergies: No Known Allergies  Medications:  Prior to Admission:  Medications Prior to Admission  Medication Sig Dispense Refill Last Dose  . acetaminophen (TYLENOL) 325 MG tablet Take 2 tablets (650 mg total) by mouth every 6 (six) hours as needed for mild pain.   07/19/2018 at prn  . aspirin EC 325 MG EC tablet Take 1 tablet (325 mg total) by mouth daily. 30 tablet 0 07/20/2018 at Unknown time  . ciprofloxacin (CIPRO) 500 MG tablet Take 500 mg by mouth 2 (two) times daily.  Chronic prostatitis   07/20/2018 at Unknown time  . ergocalciferol (VITAMIN D2) 50000 UNITS capsule Take 50,000 Units by mouth 2 (two) times a week. Saturdays & Wednesdays.   07/18/2018  . esomeprazole (NEXIUM) 40 MG capsule Take 40 mg by mouth daily before breakfast.   07/20/2018 at Unknown time  . ferrous sulfate 325 (65 FE) MG tablet Take 1 tablet (325 mg total) by mouth daily with lunch. 30 tablet 0 07/20/2018 at Unknown time  . folic acid (FOLVITE) 1 MG tablet Take 1 tablet (1 mg total) by mouth daily. 30 tablet  0 07/20/2018 at Unknown time  . furosemide (LASIX) 40 MG tablet Take 1 tablet (40 mg total) by mouth daily for 5 days. 5 tablet 0 07/20/2018 at Unknown time  . metoprolol tartrate (LOPRESSOR) 25 MG tablet Take 1 tablet (25 mg total) by mouth 2 (two) times daily. 60 tablet 3 07/20/2018 at 0730  . Pitavastatin Calcium (LIVALO) 1 MG TABS Take 1 mg by mouth daily.    07/20/2018 at Unknown time  . potassium chloride SA (K-DUR,KLOR-CON) 20 MEQ tablet Take 1 tablet (20 mEq total) by mouth daily for 5 days. 5 tablet 0 07/20/2018 at Unknown time  . oxyCODONE (OXY IR/ROXICODONE) 5 MG immediate release tablet Take 1-2 tablets (5-10 mg total) by mouth every 4 (four) hours as needed for severe pain. (Patient not taking: Reported on 07/20/2018) 30 tablet 0 Not Taking at Unknown time   Scheduled: . aspirin  325 mg Oral Daily  . ciprofloxacin  500 mg Oral BID  . enoxaparin (LOVENOX) injection  40 mg Subcutaneous Q24H  . ferrous sulfate  325 mg Oral Q lunch  . folic acid  1 mg Oral Daily  . metoprolol tartrate  25 mg Oral BID  . pantoprazole  40 mg Oral Daily  . pravastatin  20 mg Oral q1800    ROS: Denies current symptoms of weakness, vision loss, numbness or speech change. Other ROS as per HPI.   Physical Examination: Blood pressure 110/69, pulse 79, temperature 98.6 F (37 C), temperature source Oral, resp. rate 20, SpO2 98 %.  HEENT: [place]/AT Lungs: Respirations unlabored Ext: Warm and well-perfused  Neurologic Examination: Mental Status: Alert, oriented to self, state, day, month and year but not to city or hospital. Speech fluent with intact comprehension, naming and repetition. Able to follow a 2-step directional command without difficulty. Cranial Nerves: II:  Visual fields intact without extinction to DSS. PERRL.  III,IV, VI: No ptosis not present, EOMI without nystagmus V,VII: No facial droop. Facial temp sensation equal bilaterally VIII: hearing intact to voice  IX,X: no hypophonia XI: Symmetric  shoulder shrug XII: midline tongue extension Motor: Right : Upper extremity   5/5    Left:     Upper extremity   5/5  Lower extremity   5/5     Lower extremity   5/5 No pronator drift Sensory: Temp and light touch intact throughout, bilaterally Deep Tendon Reflexes:  Normoactive x 4 Cerebellar: No ataxia with FNF bilaterally  Gait: Deferred  Results for orders placed or performed during the hospital encounter of 07/20/18 (from the past 48 hour(s))  Urinalysis, Routine w reflex microscopic     Status: None   Collection Time: 07/20/18  3:23 PM  Result Value Ref Range   Color, Urine YELLOW YELLOW   APPearance CLEAR CLEAR   Specific Gravity, Urine 1.016 1.005 - 1.030   pH 6.0 5.0 - 8.0   Glucose, UA NEGATIVE NEGATIVE mg/dL  Hgb urine dipstick NEGATIVE NEGATIVE   Bilirubin Urine NEGATIVE NEGATIVE   Ketones, ur NEGATIVE NEGATIVE mg/dL   Protein, ur NEGATIVE NEGATIVE mg/dL   Nitrite NEGATIVE NEGATIVE   Leukocytes, UA NEGATIVE NEGATIVE    Comment: Performed at [facility] [address] [place], [place] [address]  Comprehensive metabolic panel     Status: Abnormal   Collection Time: 07/20/18  3:38 PM  Result Value Ref Range   Sodium 137 135 - 145 mmol/L   Potassium 4.6 3.5 - 5.1 mmol/L   Chloride 106 98 - 111 mmol/L   CO2 28 22 - 32 mmol/L   Glucose, Bld 115 (H) 70 - 99 mg/dL   BUN 14 8 - 23 mg/dL   Creatinine, Ser 0.96 0.61 - 1.24 mg/dL   Calcium 9.9 8.9 - 10.3 mg/dL   Total Protein 5.7 (L) 6.5 - 8.1 g/dL   Albumin 3.1 (L) 3.5 - 5.0 g/dL   AST 25 15 - 41 U/L   ALT 32 0 - 44 U/L   Alkaline Phosphatase 54 38 - 126 U/L   Total Bilirubin 0.8 0.3 - 1.2 mg/dL   GFR calc non Af Amer >60 >60 mL/min   GFR calc Af Amer >60 >60 mL/min    Comment: (NOTE) The eGFR has been calculated using the CKD EPI equation. This calculation has not been validated in all clinical situations. eGFR's persistently <60 mL/min signify possible Chronic Kidney Disease.    Anion gap 3 (L) 5 - 15     Comment: Performed at [facility], [place] [address], [place], [place] [address]  CBC with Differential     Status: Abnormal   Collection Time: 07/20/18  3:38 PM  Result Value Ref Range   WBC 11.3 (H) 4.0 - 10.5 K/uL   RBC 3.07 (L) 4.22 - 5.81 MIL/uL   Hemoglobin 9.4 (L) 13.0 - 17.0 g/dL   HCT 29.4 (L) 39.0 - 52.0 %   MCV 95.8 80.0 - 100.0 fL   MCH 30.6 26.0 - 34.0 pg   MCHC 32.0 30.0 - 36.0 g/dL   RDW 13.0 11.5 - 15.5 %   Platelets 185 150 - 400 K/uL   nRBC 0.0 0.0 - 0.2 %   Neutrophils Relative % 75 %   Neutro Abs 8.5 (H) 1.7 - 7.7 K/uL   Lymphocytes Relative 13 %   Lymphs Abs 1.4 0.7 - 4.0 K/uL   Monocytes Relative 9 %   Monocytes Absolute 1.0 0.1 - 1.0 K/uL   Eosinophils Relative 2 %   Eosinophils Absolute 0.2 0.0 - 0.5 K/uL   Basophils Relative 0 %   Basophils Absolute 0.0 0.0 - 0.1 K/uL   Immature Granulocytes 1 %   Abs Immature Granulocytes 0.08 (H) 0.00 - 0.07 K/uL    Comment: Performed at [facility] [address], [place], [place] [address]  Protime-INR     Status: None   Collection Time: 07/20/18  3:38 PM  Result Value Ref Range   Prothrombin Time 15.1 11.4 - 15.2 seconds   INR 1.20     Comment: Performed at [facility] [address] [place], [place] [address]  CBG monitoring, ED     Status: Abnormal   Collection Time: 07/20/18  3:56 PM  Result Value Ref Range   Glucose-Capillary 102 (H) 70 - 99 mg/dL  I-stat troponin, ED     Status: Abnormal   Collection Time: 07/20/18  3:59 PM  Result Value  Ref Range   Troponin i, poc 0.12 (HH) 0.00 - 0.08 ng/mL   Comment NOTIFIED PHYSICIAN    Comment 3            Comment: Due to the release kinetics of cTnI, a negative result within the first hours of the onset of symptoms does not rule out myocardial infarction with certainty. If myocardial infarction is still suspected, repeat the test at appropriate intervals.   I-Stat CG4 Lactic Acid, ED     Status: None   Collection Time: 07/20/18  4:01  PM  Result Value Ref Range   Lactic Acid, Venous 0.86 0.5 - 1.9 mmol/L  Troponin I Now Then Q6H     Status: Abnormal   Collection Time: 07/21/18  1:32 AM  Result Value Ref Range   Troponin I 0.11 (HH) <0.03 ng/mL    Comment: CRITICAL RESULT CALLED TO, READ BACK BY AND VERIFIED WITH: Inda Coke R,RN 07/21/18 0226 WAYK Performed at [facility], [place] [address] [place], [place] [address]   TSH     Status: None   Collection Time: 07/21/18  1:32 AM  Result Value Ref Range   TSH 2.017 0.350 - 4.500 uIU/mL    Comment: Performed by a 3rd Generation assay with a functional sensitivity of <=0.01 uIU/mL. Performed at [facility], [place] [address] [place],  [address]   Procalcitonin - Baseline     Status: None   Collection Time: 07/21/18  1:32 AM  Result Value Ref Range   Procalcitonin <0.10 ng/mL    Comment:        Interpretation: PCT (Procalcitonin) <= 0.5 ng/mL: Systemic infection (sepsis) is not likely. Local bacterial infection is possible. (NOTE)       Sepsis PCT Algorithm           Lower Respiratory Tract                                      Infection PCT Algorithm    ----------------------------     ----------------------------         PCT < 0.25 ng/mL                PCT < 0.10 ng/mL         Strongly encourage             Strongly discourage   discontinuation of antibiotics    initiation of antibiotics    ----------------------------     -----------------------------       PCT 0.25 - 0.50 ng/mL            PCT 0.10 - 0.25 ng/mL               OR       >80% decrease in PCT            Discourage initiation of                                            antibiotics      Encourage discontinuation           of antibiotics    ----------------------------     -----------------------------         PCT >= 0.50 ng/mL  PCT 0.26 - 0.50 ng/mL               AND        <80% decrease in PCT             Encourage initiation of                                              antibiotics       Encourage continuation           of antibiotics    ----------------------------     -----------------------------        PCT >= 0.50 ng/mL                  PCT > 0.50 ng/mL               AND         increase in PCT                  Strongly encourage                                      initiation of antibiotics    Strongly encourage escalation           of antibiotics                                     -----------------------------                                           PCT <= 0.25 ng/mL                                                 OR                                        > 80% decrease in PCT                                     Discontinue / Do not initiate                                             antibiotics Performed at [facility], [address] [place], [place] [address]    Dg Chest 2 View  Result Date: 07/20/2018 CLINICAL DATA:  Confusion weakness. Chest pain and shortness-of-breath. EXAM: CHEST - 2 VIEW COMPARISON:  07/16/2018 FINDINGS: Sternotomy wires are unchanged. Lungs are hypoinflated with stable mild left base/retrocardiac opacification which may be due to atelectasis or infection. No significant effusion. Mild stable cardiomegaly. Remainder of the exam is unchanged. IMPRESSION: Stable left base/retrocardiac opacification which may  be due to atelectasis or infection. Mild stable cardiomegaly. Electronically Signed   By: Marin Olp M.D.   On: 07/20/2018 16:44   Ct Head Wo Contrast  Result Date: 07/20/2018 CLINICAL DATA:  Altered level of consciousness. Acute onset of confusion. EXAM: CT HEAD WITHOUT CONTRAST TECHNIQUE: Contiguous axial images were obtained from the base of the skull through the vertex without intravenous contrast. COMPARISON:  None. FINDINGS: Brain: Focal hypoattenuation in the subcortical right parietal lobe is consistent with an age indeterminate white matter infarct. No other significant white matter disease is  present. Basal ganglia are intact. Insular ribbon is normal. No other acute or focal cortical lesions are present. The brainstem and cerebellum are within normal limits. Vascular: No hyperdense vessel or unexpected calcification. Skull: Calvarium is intact. No focal lytic or blastic lesions are present. Sinuses/Orbits: The paranasal sinuses and mastoid air cells are clear. The globes and orbits are within normal limits. IMPRESSION: 1. Right parietal subcortical white matter hypoattenuation likely represents focal small vessel disease. No associated cortical infarct is present. 2. Otherwise normal CT of the head. Electronically Signed   By: San Morelle M.D.   On: 07/20/2018 18:28   Mr Brain Wo Contrast (neuro Protocol)  Result Date: 07/20/2018 CLINICAL DATA:  Increasing confusion. History of hypertension and hyperlipidemia. EXAM: MRI HEAD WITHOUT CONTRAST TECHNIQUE: Multiplanar, multiecho pulse sequences of the brain and surrounding structures were obtained without intravenous contrast. COMPARISON:  CT HEAD July 20, 2018 FINDINGS: INTRACRANIAL CONTENTS: Patchy biparietal reduced diffusion, subcentimeter reduced diffusion LEFT frontal lobe. Normalized ADC values. No susceptibility artifact to suggest hemorrhage. The ventricles and sulci are normal for patient's age. A few scattered subcentimeter supratentorial white matter FLAIR T2 hyperintensities exclusive aforementioned abnormality compatible with early chronic small vessel ischemic changes, less than expected for age. No suspicious parenchymal signal, masses, mass effect. No abnormal extra-axial fluid collections. No extra-axial masses. VASCULAR: Normal major intracranial vascular flow voids present at skull base. SKULL AND UPPER CERVICAL SPINE: No abnormal sellar expansion. No suspicious calvarial bone marrow signal. Craniocervical junction maintained. SINUSES/ORBITS: The mastoid air-cells and included paranasal sinuses are well-aerated.The  included ocular globes and orbital contents are non-suspicious. OTHER: None. IMPRESSION: 1. Subacute small bilateral parietal and LEFT frontal MCA versus posterior watershed territory infarcts. 2. Otherwise negative noncontrast MRI head for age. Electronically Signed   By: Elon Alas M.D.   On: 07/20/2018 22:13    Assessment: 67 y.o. male presenting with AMS following recent CABG and aortic valve replacement.  1. Neurological exam is nonfocal. 2. MRI brain reveals  Subacute small bilateral parietal and LEFT frontal MCA versus posterior watershed territory infarcts.  3. Stroke Risk Factors - CHF, HLD, HTN and recent cardiac operative procedure 4. Carotid ultrasound in October of 2019 showed 1-39% stenosis bilaterally.  5. TEE 07/12/18: No thrombus mentioned in the report.  6. MRI images personally reviewed. The strokes are well-defined on the FLAIR images, suggestive of late subacute stage of evolution. Taking into account the above history of recent CABG and aortic valve replacement on 07/12/18.in conjunction with the MRI findings, his strokes may have been perioperative, due to microemboli.  7. His AMS may be due to an alternative etiology. AST/ALT and TSH are normal.   Plan: 1. HgbA1c, fasting lipid panel 2. MRA of the brain without contrast 3. PT consult, OT consult, Speech consult 4. Prophylactic therapy-Continue ASA and pravastatin 5. Telemetry monitoring 6. Frequent neuro checks 7. BP management 8. Limit sedating medications 9. Thiamine and B12 levels.  10. Start po thiamine supplementation.  11. RPR 12. Ammonia level   '@Electronically'$  signed: Dr. Kerney Elbe  07/21/2018, 8:17 AM

## 2018-07-21 NOTE — Progress Notes (Signed)
Received patient from ED, accompanied by his son, alert and oriented X4, assessment completed, placed on Tele and verified

## 2018-07-21 NOTE — Progress Notes (Signed)
STROKE TEAM PROGRESS NOTE   HISTORY OF PRESENT ILLNESS (per record) Daniel Mann is an 67 y.o. male who presented with AMS. MRI revealed strokes in separate vascular territories, concerning for cardioembolic phenomenon; the strokes were well-defined on the FLAIR images, suggestive of late subacute stage of evolution. He had recent CABG and aortic valve replacement on 07/12/18. Taking the above history in conjunction with the MRI findings, his strokes may have been perioperative, due to microemboli. His AMS may be due to an alternative etiology.   His PMHx includes HTN, HLD, CHF and GERD.   Further information regarding his AMS is obtained from the chart:  "They (family) believe he is having problems with his short-term memory. Son states he took thepatient out to [employer] but he could not remember where their car was parked and did not remember they had ordered food. States patient did not remember that he recently had a surgery. Family did not notice any slurring of speech, facial droop, or focal weakness. Patient states he has been having aslight frontal headache today. Denies having any chest pain or shortness of breath.States other than Tylenol he has not taken any other pain medications at home."   SUBJECTIVE (INTERVAL HISTORY) Daughter at bedside.  He denies headache, dizziness, confusion, or focal weakness.  MRI Brain - bilateral parietal, left frontal, left anterior temporal small infarcts.  S/P CABG 10/31.  TEE normal.  CDUS negative.    Vit B12, NH3, TSH all normal.    OBJECTIVE Vitals:   07/21/18 0400 07/21/18 0933 07/21/18 1252 07/21/18 1619  BP: 110/69 105/69 113/61 108/67  Pulse: 79 83 79 74  Resp:  20 20 17   Temp: 98.6 F (37 C) 98.1 F (36.7 C) 97.8 F (36.6 C) 98 F (36.7 C)  TempSrc: Oral Oral Oral Oral  SpO2: 98% 97% 97% 97%    CBC:  Recent Labs  Lab 07/15/18 0222 07/20/18 1538  WBC 12.4* 11.3*  NEUTROABS  --  8.5*  HGB 8.3* 9.4*  HCT 26.7*  29.4*  MCV 97.1 95.8  PLT 149* 185    Basic Metabolic Panel:  Recent Labs  Lab 07/15/18 0222 07/20/18 1538  NA 134* 137  K 4.2 4.6  CL 101 106  CO2 29 28  GLUCOSE 124* 115*  BUN 9 14  CREATININE 0.61 0.96  CALCIUM 9.4 9.9    Lipid Panel:     Component Value Date/Time   CHOL 180 08/31/2015 0814   TRIG 80 08/31/2015 0814   HDL 51 08/31/2015 0814   CHOLHDL 3.5 08/31/2015 0814   VLDL 16 08/31/2015 0814   LDLCALC 113 08/31/2015 0814   HgbA1c:  Lab Results  Component Value Date   HGBA1C 5.4 07/10/2018   Urine Drug Screen: No results found for: LABOPIA, COCAINSCRNUR, LABBENZ, AMPHETMU, THCU, LABBARB  Alcohol Level No results found for: [facility]  IMAGING   Dg Chest 2 View 07/20/2018 IMPRESSION:  Stable left base/retrocardiac opacification which may be due to atelectasis or infection. Mild stable cardiomegaly.   Ct Head Wo Contrast 07/20/2018 IMPRESSION:  1. Right parietal subcortical white matter hypoattenuation likely represents focal small vessel disease. No associated cortical infarct is present.  2. Otherwise normal CT of the head.    Mr Brain Wo Contrast (neuro Protocol) 07/20/2018 IMPRESSION:  1. Subacute small bilateral parietal and LEFT frontal MCA versus posterior watershed territory infarcts.  2. Otherwise negative noncontrast MRI head for age.       TEE 07/12/2018 Left Ventricle Normal cavity size and  wall thickness. LV systolic function is normal with an EF of 55-60%. There are no obvious wall motion abnormalities. No thrombus present. No mass present.  Septum Normal atrial and ventricular septum with no septal defect and normal septal motion. Sigmoid basal septal hypertrophy  Left Atrium Left atrium normal in structure and function. No thrombus present. No left atrial appendage. No mass present. No ASD or PFO closure device in interatrial septum.  Aortic Valve The valve is trileaflet. Severe valve calcification present. Severely decreased leaflet  separation. Severe stenosis. Trace regurgitation.  Aorta No aneurysm present. No aortic dissection present  Mitral Valve Normal valve structure. No stenosis. Normal leaflet mobility. Mild regurgitation.  Right Ventricle Normal cavity size, wall thickness and ejection fraction.  Right Atrium Normal right atrial size.  Tricuspid Valve Normal tricuspid valve structure. No stenosis. Trace regurgitation.  Pulmonic Valve Normal pulmonic valve structure. No stenosis. Trace regurgitation.  Pericardium Normal pericardium with no pericardial effusion.  Post-Op TEE AORTA Aorta unchanged from pre-bypass.  LEFT VENTRICLE Left ventricle unchanged from pre-bypass.  RIGHT VENTRICLE Right ventricle unchanged from pre-bypass.  AORTIC VALVE A bioprosthetic valve was placed. Valve is well seated. Leaflet is freely mobile. paravalvular leak and  MITRAL VALVE Mitral valve unchanged from pre-bypass.  TRICUSPID VALVE Tricuspid valve unchanged from pre-bypass.      Bilateral Carotid Dopplers  07/10/2018 Summary: Right Carotid: Velocities in the right ICA are consistent with a 1-39% stenosis. Left Carotid: Velocities in the left ICA are consistent with a 1-39% stenosis. Vertebrals: Bilateral vertebral arteries demonstrate antegrade flow.    PHYSICAL EXAM Blood pressure 108/67, pulse 74, temperature 98 F (36.7 C), temperature source Oral, resp. rate 17, SpO2 97 %.  Awake, alert. Fully oriented to year, month, date, day, place, city, state, president. Fluent.  Comprehension, naming, repetition- intact. Face symmetrical.  Tongue midline. PERL. EOMI. Strength 5/5 BUE and BLE. Coord- FTN intact bilaterally. Sensory- intact. Gait - normal.       ASSESSMENT/PLAN Mr. Stephen Baruch is a 67 y.o. male with history of HTN, HLD, CHF and GERD presenting with AMS. He did not receive IV t-PA due to recent surgery.  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  Resultant   Confusion.  CT head - Right parietal subcortical white matter hypoattenuation likely represents focal small vessel disease  MRI head - Subacute small bilateral parietal and LEFT frontal MCA versus posterior watershed territory infarcts.   MRA head - not performed  Carotid Doppler - 07/10/2018 - unremarkable  TEE - 07/12/2018 - EF 55 - 60%. No cardiac source of emboli identified.   LDL - will order  HgbA1c - will order   VTE prophylaxis - Lovenox  Diet  - Heart healthy with thin liquids.  aspirin 325 mg daily prior to admission, now on aspirin 325 mg daily  Patient counseled to be compliant with his antithrombotic medications  Ongoing aggressive stroke risk factor management  Therapy recommendations:  pending  Disposition:  Pending  Hypertension  Stable . Permissive hypertension (OK if < 220/120) but gradually normalize in 5-7 days . Long-term BP goal normotensive  Hyperlipidemia  Lipid lowering medication PTA: none  LDL pending, goal < 70  Current lipid lowering medication: Pravastatin 20 mg daily  Continue statin at discharge   Other Stroke Risk Factors  Advanced age  Former cigarette smoker - quit  ETOH use, advised to drink no more than 1 alcoholic beverage per day.  There is no height or weight on file to  calculate BMI., recommend weight loss, diet and exercise as appropriate     Other Active Problems  Post op anemia  Mild leukocytosis      Hospital day # 0  Multiple embolic infarcts which are mostly small in size and with little mass effect and not likely to be symptomatic.  His neurological exam is intact.  The sudden onset of embolism may have caused the short duration encephalopathy he was admitted with, but not clear whether he happened peri-operatively about a week ago.  At this time, no evidence of PAF and not a great candidate for anticoagulation given recent surgery.  I recommend we continue ASA monotherapy.  His BP is well  controlled and he is on statin.  He is non-smoker now and not diabetic.  He should follow up in stroke clinic in 1 month to discuss further management.  Will sign off.    Weston Settle, MS, MD  To contact Stroke Continuity provider, please refer to [email or web address]. After hours, contact General Neurology

## 2018-07-21 NOTE — Progress Notes (Signed)
A copy of pt's Healthcare Power of Gerrit Friends is in chart.

## 2018-07-21 NOTE — Progress Notes (Signed)
Progress Note    Daniel Mann  UJW:[id number] DOB: 29-Sep-1950  DOA: 07/20/2018 PCP: Blane Ohara, MD    Brief Narrative:     Medical records reviewed and are as summarized below:  Daniel Mann is a 67 y.o. male with medical history significant of CHF, GERD, hypertension, hyperlipidemia, aortic stenosis, recent CABG and aortic valve replacement presenting to the hospital for evaluation of altered mental status.  Per family at bedside, patient was doing well since his hospital discharge a few days ago but then became acutely confused today. They believe he is having problems with his short-term memory.  Son states he took the patient out to a restaurant but he could not remember where their car was parked and did not remember they had ordered food.  States patient did not remember that he recently had a surgery.  Family did not notice any slurring of speech, facial droop, or focal weakness.  Patient states he has been having a slight frontal headache today.  Denies having any chest pain or shortness of breath.  States other than Tylenol he has not taken any other pain medications at home.  Patient was recently admitted and underwent CABG and aortic valve replacement on July 12, 2018.  He was discharged on July 16, 2018.  Assessment/Plan:   Principal Problem:   Acute encephalopathy Active Problems:   Hypertension   GERD (gastroesophageal reflux disease)   Mixed hyperlipidemia   S/P CABG x 3   Coronary artery disease   Elevated troponin   Anemia   Chronic prostatitis  Acute encephalopathy -patient was seen with son, although not as confused he is still not back to his baseline  (normally works daily managing a [employer]-- currently cannot remember how long he is going to be out of work and called into the [employer] to report his absence) Presenting with acute onset confusion and short-term memory loss per family.  -CBG 102 at admission.  Nontoxic-appearing.  White count 11.3,  improved since recent hospitalization (12.4).  Lactic acid normal.  UA not suggestive of infection. MRI brain showing subacute small bilateral parietal and left frontal MCA versus posterior watershed territory infarcts-- seen by neurology (incidential finding suspected-- not the cause of his AMS). -B12/TSH normal -gentle IVF  Right leg bruising/swelling -from vein harvesting from recent CABG? -Spoke with Erin of CVTS-- will see in AM if MD does not see  Mild hypercalcemia -gentle IVF and monitor for improvement -check vit D level-- takes supplement 2x week   ?PNA Chest x-ray showing mild stable cardiomegaly and stable left base/retrocardiac opacification which may be due to atelectasis or infection.  Not hypoxic.  White count 11.3, improved since recent hospitalization (12.4).  Lactic acid normal.  -pro calcitonin level normal -added incentive spirometry-- if fever, will start HCAP  Troponinemia Recent CABG and aortic valve replacement on July 12, 2018.  Patient is not complaining of chest pain or shortness of breath.  He appears comfortable on exam and in no distress.  I-STAT troponin 0.12.  EKG not suggestive of ACS.   Coronary artery disease status post recent CABG -Continue to trend troponin as mentioned above -Continue home aspirin, metoprolol  Anemia -In the setting of recent CABG and aortic valve replacement. -Stable.  Hemoglobin 9.4, improved since recent hospitalization (8.3).  -Continue home iron supplement, folic acid supplement  Hypertension -Currently BP on lower side -monitor  Hyperlipidemia -Continue home statin  Chronic prostatitis -hold cipro (appears he takes PRN not scheduled-- will need  to verify)  GERD -Continue PPI   Family Communication/Anticipated D/C date and plan/Code Status   DVT prophylaxis: Lovenox ordered. Code Status: Full Code.  Family Communication: son at bedside Disposition Plan: pending work up   Medical Consultants:     Neuro  CVTS     Subjective:   C/o "sinus" headache  Objective:    Vitals:   07/21/18 0200 07/21/18 0400 07/21/18 0933 07/21/18 1252  BP: 107/60 110/69 105/69 113/61  Pulse: 84 79 83 79  Resp: 20  20 20   Temp: 99.4 F (37.4 C) 98.6 F (37 C) 98.1 F (36.7 C) 97.8 F (36.6 C)  TempSrc: Oral Oral Oral Oral  SpO2: 96% 98% 97% 97%    Intake/Output Summary (Last 24 hours) at 07/21/2018 1305 Last data filed at 07/21/2018 0931 Gross per 24 hour  Intake 240 ml  Output 800 ml  Net -560 ml   There were no vitals filed for this visit.  Exam: Pleasant but slow to respond and answer some questions rhales at base of left lung rrr Right leg bruised with some firmness of right inner thigh compared to left  Data Reviewed:   I have personally reviewed following labs and imaging studies:  Labs: Labs show the following:   Basic Metabolic Panel: Recent Labs  Lab 07/15/18 0222 07/20/18 1538  NA 134* 137  K 4.2 4.6  CL 101 106  CO2 29 28  GLUCOSE 124* 115*  BUN 9 14  CREATININE 0.61 0.96  CALCIUM 9.4 9.9   GFR Estimated Creatinine Clearance: 91.8 mL/min (by C-G formula based on SCr of 0.96 mg/dL). Liver Function Tests: Recent Labs  Lab 07/20/18 1538  AST 25  ALT 32  ALKPHOS 54  BILITOT 0.8  PROT 5.7*  ALBUMIN 3.1*   No results for input(s): LIPASE, AMYLASE in the last 168 hours. No results for input(s): AMMONIA in the last 168 hours. Coagulation profile Recent Labs  Lab 07/20/18 1538  INR 1.20    CBC: Recent Labs  Lab 07/15/18 0222 07/20/18 1538  WBC 12.4* 11.3*  NEUTROABS  --  8.5*  HGB 8.3* 9.4*  HCT 26.7* 29.4*  MCV 97.1 95.8  PLT 149* 185   Cardiac Enzymes: Recent Labs  Lab 07/21/18 0132 07/21/18 0818  TROPONINI 0.11* 0.10*   BNP (last 3 results) No results for input(s): PROBNP in the last 8760 hours. CBG: Recent Labs  Lab 07/14/18 1549 07/20/18 1556  GLUCAP 152* 102*   D-Dimer: No results for input(s): DDIMER in the  last 72 hours. Hgb A1c: No results for input(s): HGBA1C in the last 72 hours. Lipid Profile: No results for input(s): CHOL, HDL, LDLCALC, TRIG, CHOLHDL, LDLDIRECT in the last 72 hours. Thyroid function studies: Recent Labs    07/21/18 0132  TSH 2.017   Anemia work up: Recent Labs    07/21/18 0818  VITAMINB12 787   Sepsis Labs: Recent Labs  Lab 07/15/18 0222 07/20/18 1538 07/20/18 1601 07/21/18 0132  PROCALCITON  --   --   --  <0.10  WBC 12.4* 11.3*  --   --   LATICACIDVEN  --   --  0.86  --     Microbiology No results found for this or any previous visit (from the past 240 hour(s)).  Procedures and diagnostic studies:  Dg Chest 2 View  Result Date: 07/20/2018 CLINICAL DATA:  Confusion weakness. Chest pain and shortness-of-breath. EXAM: CHEST - 2 VIEW COMPARISON:  07/16/2018 FINDINGS: Sternotomy wires are unchanged. Lungs are hypoinflated with  stable mild left base/retrocardiac opacification which may be due to atelectasis or infection. No significant effusion. Mild stable cardiomegaly. Remainder of the exam is unchanged. IMPRESSION: Stable left base/retrocardiac opacification which may be due to atelectasis or infection. Mild stable cardiomegaly. Electronically Signed   By: Elberta Fortis M.D.   On: 07/20/2018 16:44   Ct Head Wo Contrast  Result Date: 07/20/2018 CLINICAL DATA:  Altered level of consciousness. Acute onset of confusion. EXAM: CT HEAD WITHOUT CONTRAST TECHNIQUE: Contiguous axial images were obtained from the base of the skull through the vertex without intravenous contrast. COMPARISON:  None. FINDINGS: Brain: Focal hypoattenuation in the subcortical right parietal lobe is consistent with an age indeterminate white matter infarct. No other significant white matter disease is present. Basal ganglia are intact. Insular ribbon is normal. No other acute or focal cortical lesions are present. The brainstem and cerebellum are within normal limits. Vascular: No hyperdense  vessel or unexpected calcification. Skull: Calvarium is intact. No focal lytic or blastic lesions are present. Sinuses/Orbits: The paranasal sinuses and mastoid air cells are clear. The globes and orbits are within normal limits. IMPRESSION: 1. Right parietal subcortical white matter hypoattenuation likely represents focal small vessel disease. No associated cortical infarct is present. 2. Otherwise normal CT of the head. Electronically Signed   By: Marin Roberts M.D.   On: 07/20/2018 18:28   Mr Brain Wo Contrast (neuro Protocol)  Result Date: 07/20/2018 CLINICAL DATA:  Increasing confusion. History of hypertension and hyperlipidemia. EXAM: MRI HEAD WITHOUT CONTRAST TECHNIQUE: Multiplanar, multiecho pulse sequences of the brain and surrounding structures were obtained without intravenous contrast. COMPARISON:  CT HEAD July 20, 2018 FINDINGS: INTRACRANIAL CONTENTS: Patchy biparietal reduced diffusion, subcentimeter reduced diffusion LEFT frontal lobe. Normalized ADC values. No susceptibility artifact to suggest hemorrhage. The ventricles and sulci are normal for patient's age. A few scattered subcentimeter supratentorial white matter FLAIR T2 hyperintensities exclusive aforementioned abnormality compatible with early chronic small vessel ischemic changes, less than expected for age. No suspicious parenchymal signal, masses, mass effect. No abnormal extra-axial fluid collections. No extra-axial masses. VASCULAR: Normal major intracranial vascular flow voids present at skull base. SKULL AND UPPER CERVICAL SPINE: No abnormal sellar expansion. No suspicious calvarial bone marrow signal. Craniocervical junction maintained. SINUSES/ORBITS: The mastoid air-cells and included paranasal sinuses are well-aerated.The included ocular globes and orbital contents are non-suspicious. OTHER: None. IMPRESSION: 1. Subacute small bilateral parietal and LEFT frontal MCA versus posterior watershed territory infarcts. 2.  Otherwise negative noncontrast MRI head for age. Electronically Signed   By: Awilda Metro M.D.   On: 07/20/2018 22:13    Medications:   . aspirin  325 mg Oral Daily  . ciprofloxacin  500 mg Oral BID  . enoxaparin (LOVENOX) injection  40 mg Subcutaneous Q24H  . ferrous sulfate  325 mg Oral Q lunch  . folic acid  1 mg Oral Daily  . guaiFENesin  600 mg Oral BID  . metoprolol tartrate  25 mg Oral BID  . pantoprazole  40 mg Oral Daily  . pravastatin  20 mg Oral q1800   Continuous Infusions: . sodium chloride 50 mL/hr at 07/21/18 1235     LOS: 0 days   Joseph Art  [facility]   *Please refer to [email or web address], password [email or web address] to get updated schedule on who will round on this patient, as hospitalists switch teams weekly. If 7PM-7AM, please contact night-coverage at [email or web address], password [email or web address] for any overnight needs.  07/21/2018, 1:05 PM

## 2018-07-22 DIAGNOSIS — R7989 Other specified abnormal findings of blood chemistry: Secondary | ICD-10-CM

## 2018-07-22 DIAGNOSIS — G9340 Encephalopathy, unspecified: Secondary | ICD-10-CM | POA: Diagnosis not present

## 2018-07-22 DIAGNOSIS — I639 Cerebral infarction, unspecified: Secondary | ICD-10-CM | POA: Diagnosis not present

## 2018-07-22 DIAGNOSIS — R413 Other amnesia: Secondary | ICD-10-CM

## 2018-07-22 LAB — CBC WITH DIFFERENTIAL/PLATELET
Abs Immature Granulocytes: 0.04 10*3/uL (ref 0.00–0.07)
BASOS PCT: 1 %
Basophils Absolute: 0 10*3/uL (ref 0.0–0.1)
Eosinophils Absolute: 0.2 10*3/uL (ref 0.0–0.5)
Eosinophils Relative: 2 %
HCT: 28.4 % — ABNORMAL LOW (ref 39.0–52.0)
HEMOGLOBIN: 9 g/dL — AB (ref 13.0–17.0)
Immature Granulocytes: 1 %
LYMPHS PCT: 16 %
Lymphs Abs: 1.2 10*3/uL (ref 0.7–4.0)
MCH: 30.1 pg (ref 26.0–34.0)
MCHC: 31.7 g/dL (ref 30.0–36.0)
MCV: 95 fL (ref 80.0–100.0)
MONO ABS: 0.7 10*3/uL (ref 0.1–1.0)
MONOS PCT: 9 %
NEUTROS ABS: 5.6 10*3/uL (ref 1.7–7.7)
Neutrophils Relative %: 71 %
Platelets: 145 10*3/uL — ABNORMAL LOW (ref 150–400)
RBC: 2.99 MIL/uL — ABNORMAL LOW (ref 4.22–5.81)
RDW: 13.2 % (ref 11.5–15.5)
WBC: 7.8 10*3/uL (ref 4.0–10.5)
nRBC: 0 % (ref 0.0–0.2)

## 2018-07-22 LAB — BASIC METABOLIC PANEL
Anion gap: 3 — ABNORMAL LOW (ref 5–15)
BUN: 11 mg/dL (ref 8–23)
CHLORIDE: 109 mmol/L (ref 98–111)
CO2: 26 mmol/L (ref 22–32)
Calcium: 9.7 mg/dL (ref 8.9–10.3)
Creatinine, Ser: 0.85 mg/dL (ref 0.61–1.24)
GFR calc Af Amer: >60 mL/min (ref >60)
GFR calc non Af Amer: >60 mL/min (ref >60)
GLUCOSE: 108 mg/dL — AB (ref 70–99)
Potassium: 4.2 mmol/L (ref 3.5–5.1)
Sodium: 138 mmol/L (ref 135–145)

## 2018-07-22 LAB — LIPID PANEL
CHOL/HDL RATIO: 4.6 ratio
Cholesterol: 111 mg/dL (ref 0–200)
HDL: 24 mg/dL — AB (ref >40)
LDL Cholesterol: 65 mg/dL (ref 0–99)
TRIGLYCERIDES: 109 mg/dL (ref <150)
VLDL: 22 mg/dL (ref 0–40)

## 2018-07-22 LAB — HEMOGLOBIN A1C
Hgb A1c MFr Bld: 5.1 % (ref 4.8–5.6)
Mean Plasma Glucose: 99.67 mg/dL

## 2018-07-22 LAB — RPR: RPR: NONREACTIVE

## 2018-07-22 MED ORDER — ERGOCALCIFEROL 1.25 MG (50000 UT) PO CAPS: 50000.0000 [IU] | ORAL_CAPSULE | ORAL | Status: DC

## 2018-07-22 NOTE — Progress Notes (Signed)
NURSING PROGRESS NOTE  Daniel Mann [id number] Discharge Data: 07/22/2018 11:56 AM Attending Provider: Joseph Art, DO PCP:Cox, Fritzi Mandes, MD     [name] to be D/C'd Home per MD order.  Discussed with the patient the After Visit Summary and all questions fully answered. All IV's discontinued with no bleeding noted. All belongings returned to patient for patient to take home.   Last Vital Signs:  Blood pressure 116/68, pulse 74, temperature 97.8 F (36.6 C), temperature source Oral, resp. rate 16, SpO2 99 %.  Discharge Medication List Allergies as of 07/22/2018   No Known Allergies     Medication List    STOP taking these medications   ciprofloxacin 500 MG tablet Commonly known as:  CIPRO   furosemide 40 MG tablet Commonly known as:  LASIX   oxyCODONE 5 MG immediate release tablet Commonly known as:  Oxy IR/ROXICODONE   potassium chloride SA 20 MEQ tablet Commonly known as:  K-DUR,KLOR-CON     TAKE these medications   acetaminophen 325 MG tablet Commonly known as:  TYLENOL Take 2 tablets (650 mg total) by mouth every 6 (six) hours as needed for mild pain.   aspirin 325 MG EC tablet Take 1 tablet (325 mg total) by mouth daily.   ergocalciferol 1.25 MG (50000 UT) capsule Commonly known as:  VITAMIN D2 Take 1 capsule (50,000 Units total) by mouth once a week. Saturdays & Wednesdays. What changed:  when to take this   esomeprazole 40 MG capsule Commonly known as:  NEXIUM Take 40 mg by mouth daily before breakfast.   ferrous sulfate 325 (65 FE) MG tablet Take 1 tablet (325 mg total) by mouth daily with lunch.   folic acid 1 MG tablet Commonly known as:  FOLVITE Take 1 tablet (1 mg total) by mouth daily.   LIVALO 1 MG Tabs Generic drug:  Pitavastatin Calcium Take 1 mg by mouth daily.   metoprolol tartrate 25 MG tablet Commonly known as:  LOPRESSOR Take 1 tablet (25 mg total) by mouth 2 (two) times daily.

## 2018-07-22 NOTE — Progress Notes (Signed)
      [address]       [place] [address]             [phone]      Subjective:  No complaints. Doing well.  Ambulating independently. Called by Primary team yesterday regarding his right leg being hard with extensive bruising.  Son and patient state its much better today.  Objective: Vital signs in last 24 hours: Temp:  [97.8 F (36.6 C)-98 F (36.7 C)] 98 F (36.7 C) (11/10 0734) Pulse Rate:  [74-79] 78 (11/10 0734) Cardiac Rhythm: Normal sinus rhythm (11/10 0704) Resp:  [17-20] 20 (11/10 0734) BP: (108-119)/(61-81) 119/81 (11/10 0734) SpO2:  [96 %-97 %] 96 % (11/10 0734)  Intake/Output from previous day: 11/09 0701 - 11/10 0700 In: 885.8 [P.O.:720; I.V.:165.8] Out: -  Intake/Output this shift: Total I/O In: 120 [P.O.:120] Out: -   General appearance: alert, cooperative and no distress Heart: regular rate and rhythm Lungs: clear to auscultation bilaterally Extremities: edema ecchymosis RLE extends into back of knee/calf Wound: clean and dry, chest tube sutures remain in place  Lab Results: Recent Labs    07/20/18 1538 07/22/18 0553  WBC 11.3* 7.8  HGB 9.4* 9.0*  HCT 29.4* 28.4*  PLT 185 145*   BMET:  Recent Labs    07/20/18 1538 07/22/18 0553  NA 137 138  K 4.6 4.2  CL 106 109  CO2 28 26  GLUCOSE 115* 108*  BUN 14 11  CREATININE 0.96 0.85  CALCIUM 9.9 9.7    PT/INR:  Recent Labs    07/20/18 1538  LABPROT 15.1  INR 1.20   ABG    Component Value Date/Time   PHART 7.311 (L) 07/12/2018 1852   HCO3 22.8 07/12/2018 1852   TCO2 27 07/13/2018 1621   ACIDBASEDEF 3.0 (H) 07/12/2018 1852   O2SAT 96.0 07/12/2018 1852   CBG (last 3)  Recent Labs    07/20/18 1556  GLUCAP 102*    Assessment/Plan:  1. Right Lower Extremity Ecchymosis- wound looks good, ecchymosis present is expected.  The leg in hard in the thigh area due to scar tissue from harvest and hematoma, which should improve with time... No evidence of  infection 2. I personally removed patients chest tube sutures 3. Dispo- stable, return to office 12/4 as scheduled with [facility], patient will contact office if issues arise   LOS: 0 days    Lowella Dandy 07/22/2018

## 2018-07-22 NOTE — Discharge Summary (Addendum)
Physician Discharge Summary  Daniel Mann GNF:[id number] DOB: [birth year] DOA: 07/20/2018  PCP: Blane Ohara, MD  Admit date: 07/20/2018 Discharge date: 07/22/2018  Admitted From: home Discharge disposition: home   Recommendations for Outpatient Follow-Up:   1. Needs neurology follow up for CVA/memory impairment 2. Urology follow up for ? Need for cipro daily vs flomax 3. Vit D level pending-- adjust supplementation   Discharge Diagnosis:   Principal Problem:   Acute encephalopathy Active Problems:   Hypertension   GERD (gastroesophageal reflux disease)   Mixed hyperlipidemia   S/P CABG x 3   Coronary artery disease   Elevated troponin   Anemia   Chronic prostatitis    Discharge Condition: Improved.  Diet recommendation: Low sodium, heart healthy  Wound care: None.  Code status: Full.   History of Present Illness:   Daniel Mann is a 67 y.o. male with medical history significant of CHF, GERD, hypertension, hyperlipidemia, aortic stenosis, recent CABG and aortic valve replacement presenting to the hospital for evaluation of altered mental status.  Per family at bedside, patient was doing well since his hospital discharge a few days ago but then became acutely confused today. They believe he is having problems with his short-term memory.  Son states he took the patient out to a restaurant but he could not remember where their car was parked and did not remember they had ordered food.  States patient did not remember that he recently had a surgery.  Family did not notice any slurring of speech, facial droop, or focal weakness.  Patient states he has been having a slight frontal headache today.  Denies having any chest pain or shortness of breath.  States other than Tylenol he has not taken any other pain medications at home.  Patient was recently admitted and underwent CABG and aortic valve replacement on July 12, 2018.  He was discharged on July 16, 2018.   Hospital Course by Problem:   Acute encephalopathy- suspect related to dehydration from lasix after last d/c-- ? cipro involvement as he was taking for difficulty starting stream -patient was seen with son, although not as confused he is still not back to his baseline  (normally works daily managing a Actor-- currently cannot remember how long he is going to be out of work and called into the [employer] to report his absence) Presenting with acute onset confusion and short-term memory loss per family.  -Lactic acid normal. UA not suggestive of infection. MRI brain showing subacute small bilateral parietal and left frontal MCA versus posterior watershed territory infarcts-- seen by neurology (incidential finding suspected-- not the cause of his AMS). -B12/TSH normal -patient improved with IVF and was per son back to his baseline on day of discharge -there is a question of declining memory-- patient has been under a lot of stress this year with CABG, death of son and wife---- outpatient neurology follow up  Right leg bruising/swelling -swelling improved -bruising stable  Mild hypercalcemia -gentle IVF and monitor for improvement -check vit D level-- takes supplement 2x week- change to 1  Doubt PNA Chest x-ray showing mild stable cardiomegaly and stable left base/retrocardiac opacification which may be due to atelectasis or infection.Not hypoxic.White count 11.3, improved since recent hospitalization(12.4).Lactic acid normal.  -pro calcitonin level normal -added incentive spirometry -no further fever- not treated with abx   Troponinemia Recent CABG and aortic valve replacement on July 12, 2018. Patient is not complaining of chest pain or shortness of  breath. He appears comfortable on exam and in no distress. I-STAT troponin 0.12.EKG not suggestive of ACS.   Coronaryartery disease status post recent CABG -Continue home aspirin, metoprolol-sutures removed  in hospital  Anemia -In the setting of recent CABG and aortic valve replacement. -Stable. Hemoglobin 9.4, improved since recent hospitalization (8.3). -Continue home iron supplement,folic acid supplement  Hypertension -continue home meds  Hyperlipidemia -Continue home statin  Chronic prostatitis -d/c cipro-- urology follow up with Dr. Annabell Howells, appears to take for BPH symptoms  GERD -Continue PPI    Medical Consultants:   neuro   Discharge Exam:   Vitals:   07/21/18 1619 07/22/18 0734  BP: 108/67 119/81  Pulse: 74 78  Resp: 17 20  Temp: 98 F (36.7 C) 98 F (36.7 C)  SpO2: 97% 96%   Vitals:   07/21/18 0933 07/21/18 1252 07/21/18 1619 07/22/18 0734  BP: 105/69 113/61 108/67 119/81  Pulse: 83 79 74 78  Resp: 20 20 17 20   Temp: 98.1 F (36.7 C) 97.8 F (36.6 C) 98 F (36.7 C) 98 F (36.7 C)  TempSrc: Oral Oral Oral Oral  SpO2: 97% 97% 97% 96%    General exam: Appears calm and comfortable.   The results of significant diagnostics from this hospitalization (including imaging, microbiology, ancillary and laboratory) are listed below for reference.     Procedures and Diagnostic Studies:   Dg Chest 2 View  Result Date: 07/20/2018 CLINICAL DATA:  Confusion weakness. Chest pain and shortness-of-breath. EXAM: CHEST - 2 VIEW COMPARISON:  07/16/2018 FINDINGS: Sternotomy wires are unchanged. Lungs are hypoinflated with stable mild left base/retrocardiac opacification which may be due to atelectasis or infection. No significant effusion. Mild stable cardiomegaly. Remainder of the exam is unchanged. IMPRESSION: Stable left base/retrocardiac opacification which may be due to atelectasis or infection. Mild stable cardiomegaly. Electronically Signed   By: Elberta Fortis M.D.   On: 07/20/2018 16:44   Ct Head Wo Contrast  Result Date: 07/20/2018 CLINICAL DATA:  Altered level of consciousness. Acute onset of confusion. EXAM: CT HEAD WITHOUT CONTRAST TECHNIQUE:  Contiguous axial images were obtained from the base of the skull through the vertex without intravenous contrast. COMPARISON:  None. FINDINGS: Brain: Focal hypoattenuation in the subcortical right parietal lobe is consistent with an age indeterminate white matter infarct. No other significant white matter disease is present. Basal ganglia are intact. Insular ribbon is normal. No other acute or focal cortical lesions are present. The brainstem and cerebellum are within normal limits. Vascular: No hyperdense vessel or unexpected calcification. Skull: Calvarium is intact. No focal lytic or blastic lesions are present. Sinuses/Orbits: The paranasal sinuses and mastoid air cells are clear. The globes and orbits are within normal limits. IMPRESSION: 1. Right parietal subcortical white matter hypoattenuation likely represents focal small vessel disease. No associated cortical infarct is present. 2. Otherwise normal CT of the head. Electronically Signed   By: Marin Roberts M.D.   On: 07/20/2018 18:28   Mr Brain Wo Contrast (neuro Protocol)  Result Date: 07/20/2018 CLINICAL DATA:  Increasing confusion. History of hypertension and hyperlipidemia. EXAM: MRI HEAD WITHOUT CONTRAST TECHNIQUE: Multiplanar, multiecho pulse sequences of the brain and surrounding structures were obtained without intravenous contrast. COMPARISON:  CT HEAD July 20, 2018 FINDINGS: INTRACRANIAL CONTENTS: Patchy biparietal reduced diffusion, subcentimeter reduced diffusion LEFT frontal lobe. Normalized ADC values. No susceptibility artifact to suggest hemorrhage. The ventricles and sulci are normal for patient's age. A few scattered subcentimeter supratentorial white matter FLAIR T2 hyperintensities exclusive aforementioned abnormality compatible  with early chronic small vessel ischemic changes, less than expected for age. No suspicious parenchymal signal, masses, mass effect. No abnormal extra-axial fluid collections. No extra-axial masses.  VASCULAR: Normal major intracranial vascular flow voids present at skull base. SKULL AND UPPER CERVICAL SPINE: No abnormal sellar expansion. No suspicious calvarial bone marrow signal. Craniocervical junction maintained. SINUSES/ORBITS: The mastoid air-cells and included paranasal sinuses are well-aerated.The included ocular globes and orbital contents are non-suspicious. OTHER: None. IMPRESSION: 1. Subacute small bilateral parietal and LEFT frontal MCA versus posterior watershed territory infarcts. 2. Otherwise negative noncontrast MRI head for age. Electronically Signed   By: Awilda Metro M.D.   On: 07/20/2018 22:13     Labs:   Basic Metabolic Panel: Recent Labs  Lab 07/20/18 1538 07/22/18 0553  NA 137 138  K 4.6 4.2  CL 106 109  CO2 28 26  GLUCOSE 115* 108*  BUN 14 11  CREATININE 0.96 0.85  CALCIUM 9.9 9.7   GFR Estimated Creatinine Clearance: 103.6 mL/min (by C-G formula based on SCr of 0.85 mg/dL). Liver Function Tests: Recent Labs  Lab 07/20/18 1538  AST 25  ALT 32  ALKPHOS 54  BILITOT 0.8  PROT 5.7*  ALBUMIN 3.1*   No results for input(s): LIPASE, AMYLASE in the last 168 hours. Recent Labs  Lab 07/21/18 1325  AMMONIA 15   Coagulation profile Recent Labs  Lab 07/20/18 1538  INR 1.20    CBC: Recent Labs  Lab 07/20/18 1538 07/22/18 0553  WBC 11.3* 7.8  NEUTROABS 8.5* 5.6  HGB 9.4* 9.0*  HCT 29.4* 28.4*  MCV 95.8 95.0  PLT 185 145*   Cardiac Enzymes: Recent Labs  Lab 07/21/18 0132 07/21/18 0818  TROPONINI 0.11* 0.10*   BNP: Invalid input(s): POCBNP CBG: Recent Labs  Lab 07/20/18 1556  GLUCAP 102*   D-Dimer No results for input(s): DDIMER in the last 72 hours. Hgb A1c Recent Labs    07/22/18 0553  HGBA1C 5.1   Lipid Profile Recent Labs    07/22/18 0553  CHOL 111  HDL 24*  LDLCALC 65  TRIG 161  CHOLHDL 4.6   Thyroid function studies Recent Labs    07/21/18 0132  TSH 2.017   Anemia work up Recent Labs     07/21/18 0818  VITAMINB12 787   Microbiology No results found for this or any previous visit (from the past 240 hour(s)).   Discharge Instructions:   Discharge Instructions    Ambulatory referral to Neurology   Complete by:  As directed    An appointment is requested in approximately: 2-3 weeks   Diet - low sodium heart healthy   Complete by:  As directed    Discharge instructions   Complete by:  As directed    Will need urology follow up-- stop cipro for now-- may need flomax or other agent for BPH Follow up with PCP regarding vitamin D level pending and calcium follow up   Increase activity slowly   Complete by:  As directed      Allergies as of 07/22/2018   No Known Allergies     Medication List    STOP taking these medications   ciprofloxacin 500 MG tablet Commonly known as:  CIPRO   furosemide 40 MG tablet Commonly known as:  LASIX   oxyCODONE 5 MG immediate release tablet Commonly known as:  Oxy IR/ROXICODONE   potassium chloride SA 20 MEQ tablet Commonly known as:  K-DUR,KLOR-CON     TAKE these medications  acetaminophen 325 MG tablet Commonly known as:  TYLENOL Take 2 tablets (650 mg total) by mouth every 6 (six) hours as needed for mild pain.   aspirin 325 MG EC tablet Take 1 tablet (325 mg total) by mouth daily.   ergocalciferol 1.25 MG (50000 UT) capsule Commonly known as:  VITAMIN D2 Take 1 capsule (50,000 Units total) by mouth once a week. Saturdays & Wednesdays. What changed:  when to take this   esomeprazole 40 MG capsule Commonly known as:  NEXIUM Take 40 mg by mouth daily before breakfast.   ferrous sulfate 325 (65 FE) MG tablet Take 1 tablet (325 mg total) by mouth daily with lunch.   folic acid 1 MG tablet Commonly known as:  FOLVITE Take 1 tablet (1 mg total) by mouth daily.   LIVALO 1 MG Tabs Generic drug:  Pitavastatin Calcium Take 1 mg by mouth daily.   metoprolol tartrate 25 MG tablet Commonly known as:  LOPRESSOR Take  1 tablet (25 mg total) by mouth 2 (two) times daily.      Follow-up Information    Cox, Kirsten, MD Follow up in 1 week(s).   Specialties:  Family Medicine, Interventional Cardiology, Radiology, Anesthesiology Why:  needs urology follow up Contact information: [address] [place] [address] [phone]        Wendall Stade, MD .   Specialty:  Cardiology Contact information: [phone] [place] [address] [phone]            Time coordinating discharge: 25 min  Signed:  Joseph Art DO  [facility] 07/22/2018, 9:46 AM

## 2018-07-22 NOTE — Progress Notes (Signed)
PT Discharge Note  Patient Details Name: Daniel Mann MRN: [id number] DOB: [birth year]   Cancelled Treatment:    Reason Eval/Treat Not Completed: PT screened, no needs identified, will sign off -- pt observed walking unassisted in hallway with no obvious limitations or problems.  Talked with pt, has no concerns about mobility, has been active post CABG with excellent family assist and will begin cardiac rehab soon.  No PT needs.    Narda Amber [facility] 07/22/2018, 9:52 AM

## 2018-07-23 LAB — VITAMIN D 25 HYDROXY (VIT D DEFICIENCY, FRACTURES): Vit D, 25-Hydroxy: 54.6 ng/mL (ref 30.0–100.0)

## 2018-07-24 ENCOUNTER — Telehealth: Payer: Self-pay

## 2018-07-24 NOTE — Telephone Encounter (Signed)
Patient contacted the office asking when he could start driving again.  He stated that he was not taking any pain medications other than Tylenol.  He is s/p CABG/ AVR 07/12/2018 with Dr. Laneta SimmersBartle.  I advised and with given recent events that he is to wait until his follow-up appointment on 08/13/18.  He acknowledged receipt.

## 2018-07-30 ENCOUNTER — Telehealth: Payer: Self-pay

## 2018-07-30 DIAGNOSIS — G9349 Other encephalopathy: Secondary | ICD-10-CM | POA: Diagnosis not present

## 2018-07-30 DIAGNOSIS — I10 Essential (primary) hypertension: Secondary | ICD-10-CM | POA: Diagnosis not present

## 2018-07-30 DIAGNOSIS — E668 Other obesity: Secondary | ICD-10-CM | POA: Diagnosis not present

## 2018-07-30 DIAGNOSIS — I350 Nonrheumatic aortic (valve) stenosis: Secondary | ICD-10-CM | POA: Diagnosis not present

## 2018-07-30 DIAGNOSIS — I2510 Atherosclerotic heart disease of native coronary artery without angina pectoris: Secondary | ICD-10-CM | POA: Diagnosis not present

## 2018-07-30 NOTE — Telephone Encounter (Signed)
[name] contacted the office again stating he wished to start driving.  He stated that he was walking longer distances.  I advised that he should wait until his next follow up appointment on 08/13/18 for clearance to drive.  With his recent trip to the emergency department for mental status changes I feel at this time he should wait until seen by Dr. Laneta SimmersBartle.  Patient acknowledged receipt.

## 2018-08-05 ENCOUNTER — Other Ambulatory Visit: Payer: Self-pay | Admitting: Physician Assistant

## 2018-08-08 ENCOUNTER — Other Ambulatory Visit: Payer: Self-pay | Admitting: *Deleted

## 2018-08-08 DIAGNOSIS — Z951 Presence of aortocoronary bypass graft: Secondary | ICD-10-CM

## 2018-08-08 DIAGNOSIS — R351 Nocturia: Secondary | ICD-10-CM | POA: Diagnosis not present

## 2018-08-08 DIAGNOSIS — N401 Enlarged prostate with lower urinary tract symptoms: Secondary | ICD-10-CM | POA: Diagnosis not present

## 2018-08-13 ENCOUNTER — Ambulatory Visit (INDEPENDENT_AMBULATORY_CARE_PROVIDER_SITE_OTHER): Payer: Self-pay | Admitting: Physician Assistant

## 2018-08-13 ENCOUNTER — Ambulatory Visit
Admission: RE | Admit: 2018-08-13 | Discharge: 2018-08-13 | Disposition: A | Discharge status: Home / Self Care | Payer: BLUE CROSS/BLUE SHIELD | Source: Ambulatory Visit | Attending: Surgery | Admitting: Surgery

## 2018-08-13 ENCOUNTER — Other Ambulatory Visit: Payer: Self-pay

## 2018-08-13 VITALS — BP 139/86 | HR 74 | Resp 16 | Ht 72.0 in | Wt 208.0 lb

## 2018-08-13 DIAGNOSIS — J90 Pleural effusion, not elsewhere classified: Secondary | ICD-10-CM | POA: Diagnosis not present

## 2018-08-13 DIAGNOSIS — Z953 Presence of xenogenic heart valve: Secondary | ICD-10-CM

## 2018-08-13 DIAGNOSIS — Z951 Presence of aortocoronary bypass graft: Secondary | ICD-10-CM

## 2018-08-13 DIAGNOSIS — I2510 Atherosclerotic heart disease of native coronary artery without angina pectoris: Secondary | ICD-10-CM

## 2018-08-13 DIAGNOSIS — I350 Nonrheumatic aortic (valve) stenosis: Secondary | ICD-10-CM

## 2018-08-13 NOTE — Progress Notes (Signed)
HPI: Patient returns for routine postoperative follow-up having undergone CABG x 3 and AVR on 07/12/2018. The patient's early postoperative recovery while in the hospital was unremarkable.  He was readmitted shortly after discharge for altered mental status.  He was treated for dehydration and recommended to follow up with Neurology as no acute findings were present on CT scan.  Since hospital discharge the patient reports he is doing great.  He is ambulating 1 hour per day.  He has no pain.  His incisions have healed up very well.  He has had no further issues of confusion.  He is anxious to get back to work as he has always been very active and he lives alone.    Current Outpatient Medications  Medication Sig Dispense Refill  . acetaminophen (TYLENOL) 325 MG tablet Take 2 tablets (650 mg total) by mouth every 6 (six) hours as needed for mild pain.    Marland Kitchen. aspirin EC 325 MG EC tablet Take 1 tablet (325 mg total) by mouth daily. 30 tablet 0  . ergocalciferol (VITAMIN D2) 1.25 MG (50000 UT) capsule Take 1 capsule (50,000 Units total) by mouth once a week. Saturdays & Wednesdays.    Marland Kitchen. esomeprazole (NEXIUM) 40 MG capsule Take 40 mg by mouth daily before breakfast.    . ferrous sulfate 325 (65 FE) MG tablet Take 1 tablet (325 mg total) by mouth daily with lunch. 30 tablet 0  . folic acid (FOLVITE) 1 MG tablet Take 1 tablet (1 mg total) by mouth daily. 30 tablet 0  . metoprolol tartrate (LOPRESSOR) 25 MG tablet Take 1 tablet (25 mg total) by mouth 2 (two) times daily. 60 tablet 3  . Pitavastatin Calcium (LIVALO) 1 MG TABS Take 1 mg by mouth daily.      No current facility-administered medications for this visit.     Physical Exam:  BP 139/86 (BP Location: Right Arm, Patient Position: Sitting, Cuff Size: Large)   Pulse 74   Resp 16   Ht 6' (1.829 m)   Wt 208 lb (94.3 kg)   SpO2 98% Comment: ON RA  BMI 28.21 kg/m   Gen: no apparent distress Heart: RRR Lungs: CTA bilaterally Abd; soft non-tender,  non-distended Ext: no edema Incisions: well healed  Diagnostic Tests:  CXR: sternal wires intact,  Minimal left pleural effusion, mild atelectasis  A/P;  1. S/p CABG x 3, AVR- doing very well.Marland Kitchen. He is mildly hypertensive today.  Will monitor if remains elevated can start low dose ACE, will defer to Cardiology 2. Activity- increase as tolerated.  He may resume driving.  Increase activity level as tolerated, avoid lifting more than 10-15 lbs for the next several weeks.  Okay to start cardiac rehab, will fax prescription to [place].  Endocarditis prophylaxis provided 3. RTC in 3 months with Dr. Anselm LisBartle  Arda Daggs, PA-C [facility] [phone]

## 2018-08-14 ENCOUNTER — Encounter: Payer: Self-pay | Admitting: *Deleted

## 2018-08-14 ENCOUNTER — Encounter: Payer: Self-pay | Admitting: Neurology

## 2018-08-14 ENCOUNTER — Ambulatory Visit (INDEPENDENT_AMBULATORY_CARE_PROVIDER_SITE_OTHER): Payer: BLUE CROSS/BLUE SHIELD | Admitting: Neurology

## 2018-08-14 VITALS — BP 122/80 | HR 64 | Ht 72.0 in | Wt 215.2 lb

## 2018-08-14 DIAGNOSIS — I6300 Cerebral infarction due to thrombosis of unspecified precerebral artery: Secondary | ICD-10-CM

## 2018-08-14 DIAGNOSIS — G3184 Mild cognitive impairment, so stated: Secondary | ICD-10-CM | POA: Diagnosis not present

## 2018-08-14 NOTE — Progress Notes (Signed)
[facility] [address] [place]. [place] [address] [phone]       OFFICE CONSULT NOTE  [name] Date of Birth:  Oct 31, 1950 Medical Record Number:  [id number]   Referring MD: Marlin Canary  Reason for Referral: Strokes HPI: Daniel Mann is a pleasant 67 year old Caucasian male seen today for initial office consultation visit for stroke.  History is obtained from the patient, review of electronic medical records and have personally reviewed imaging films in PACS.  The patient underwent coronary artery bypass graft surgery on 07/12/2018 with aortic valve replacement with a pig valve.  He presented on 07/21/2018 with family stating that he was having problems with short-term memory.  The son took him to restaurant but patient could not remember where the car was parked and did not remember that they had already ordered food.  He also could not remember for short while that he had open heart surgery.  There was no slurred speech facial droop or focal weakness noted.  He did complain of mild slight frontal headache.  Patient underwent MRI scan of the brain which showed bilateral parietal, left frontal, left anterior temporal small infarcts which were felt to be subacute and normal already seen on the FLAIR images as well.  Urine drug screen was negative.  Vitamin B12, TSH and ammonia levels were all normal.  He had a preoperative transesophageal echocardiogram which was unremarkable.  LDL cholesterol was 65 mg percent.  Hemoglobin A1c was 5.4.  Patient was started on aspirin and states is done well since discharge.  He has had no further episodes of confusion.  He feels and his son agrees that he is at his baseline.  He works as a Social research officer, government at [employer].  The patient is worried about Alzheimer's since his mother and a paternal uncle had Alzheimer's dementia.  Patient actually did well on testing today except for diminished short-term recall.  ROS:   14 system review of systems  is positive for memory difficulties, murmur and all other systems negative  PMH:  Past Medical History:  Diagnosis Date  . Aortic stenosis   . CHF (congestive heart failure) (HCC)   . GERD (gastroesophageal reflux disease)   . Heart murmur   . Hernia, inguinal, right   . Hyperlipidemia   . Hypertension   . Impaired fasting glucose   . Mixed hyperlipidemia   . Vitamin D deficiency     Social History:  Social History   Socioeconomic History  . Marital status: Widowed    Spouse name: Not on file  . Number of children: Not on file  . Years of education: Not on file  . Highest education level: Not on file  Occupational History  . Not on file  Social Needs  . Financial resource strain: Not on file  . Food insecurity:    Worry: Not on file    Inability: Not on file  . Transportation needs:    Medical: Not on file    Non-medical: Not on file  Tobacco Use  . Smoking status: Former Games developer  . Smokeless tobacco: Never Used  Substance and Sexual Activity  . Alcohol use: Yes    Comment: WINE   . Drug use: No  . Sexual activity: Not on file  Lifestyle  . Physical activity:    Days per week: Not on file    Minutes per session: Not on file  . Stress: Not on file  Relationships  . Social connections:  Talks on phone: Not on file    Gets together: Not on file    Attends religious service: Not on file    Active member of club or organization: Not on file    Attends meetings of clubs or organizations: Not on file    Relationship status: Not on file  . Intimate partner violence:    Fear of current or ex partner: Not on file    Emotionally abused: Not on file    Physically abused: Not on file    Forced sexual activity: Not on file  Other Topics Concern  . Not on file  Social History Narrative  . Not on file    Medications:   Current Outpatient Medications on File Prior to Visit  Medication Sig Dispense Refill  . acetaminophen (TYLENOL) 325 MG tablet Take 2 tablets  (650 mg total) by mouth every 6 (six) hours as needed for mild pain.    Marland Kitchen. aspirin EC 325 MG EC tablet Take 1 tablet (325 mg total) by mouth daily. 30 tablet 0  . ergocalciferol (VITAMIN D2) 1.25 MG (50000 UT) capsule Take 1 capsule (50,000 Units total) by mouth once a week. Saturdays & Wednesdays.    Marland Kitchen. esomeprazole (NEXIUM) 40 MG capsule Take 40 mg by mouth daily before breakfast.    . metoprolol tartrate (LOPRESSOR) 25 MG tablet Take 1 tablet (25 mg total) by mouth 2 (two) times daily. 60 tablet 3  . Pitavastatin Calcium (LIVALO) 1 MG TABS Take 1 mg by mouth daily.      No current facility-administered medications on file prior to visit.     Allergies:  No Known Allergies  Physical Exam General: well developed, well nourished, seated, in no evident distress Head: head normocephalic and atraumatic.   Neck: supple with no carotid or supraclavicular bruits Cardiovascular: regular rate and rhythm, no murmurs Musculoskeletal: no deformity Skin:  no rash/petichiae Vascular:  Normal pulses all extremities  Neurologic Exam Mental Status: Awake and fully alert. Oriented to place and time. Recent and remote memory intact. Attention span, concentration and fund of knowledge appropriate. Mood and affect appropriate.  Diminished recall 0/3.  Able to name 12 animals with 4 legs.  Clock drawing 3/4.   Cranial Nerves: Fundoscopic exam reveals sharp disc margins. Pupils equal, briskly reactive to light. Extraocular movements full without nystagmus. Visual fields full to confrontation. Hearing intact. Facial sensation intact. Face, tongue, palate moves normally and symmetrically.  Motor: Normal bulk and tone. Normal strength in all tested extremity muscles. Sensory.: intact to touch , pinprick , position and vibratory sensation.  Coordination: Rapid alternating movements normal in all extremities. Finger-to-nose and heel-to-shin performed accurately bilaterally. Gait and Station: Arises from chair without  difficulty. Stance is normal. Gait demonstrates normal stride length and balance . Able to heel, toe and tandem walk with mild difficulty.  Reflexes: 1+ and symmetric. Toes downgoing.   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  He has mild memory difficulties due to mild cognitive impairment.  No significant vascular risk factors except coronary artery disease     PLAN: I had a long d/w patient and his son about his recent  Post cardiac surgery strokes,mild cognitive impairment  risk for recurrent stroke/TIAs, personally independently reviewed imaging studies and stroke evaluation results and answered questions.Continue aspirin 325 mg daily  for secondary stroke prevention and maintain strict control of  hypertension with blood pressure goal below 130/90, diabetes with hemoglobin A1c goal below 6.5% and lipids with LDL cholesterol goal below 70 mg/dL. I also advised the patient to eat a healthy diet with plenty of whole grains, cereals, fruits and vegetables, exercise regularly and maintain ideal body weight .we also discussed memory compensation strategies and I advised him to increase participation in cognitively challenging activities like solving crossword puzzles, playing sudoku and bridge.  Greater than 50% time during this 45-minute consultation visit was spent on counseling and coordination of care about his strokes and mild cognitive impairment and answering questions followup in the future with my nurse practitioner in 3 months or call earlier if necessary. Delia Heady, MD  [facility] [address] [place], [place] [address]  Phone [phone] Fax [phone] Note: This document was prepared with digital dictation and possible smart phrase technology. Any transcriptional errors that result from this process  are unintentional.

## 2018-08-14 NOTE — Patient Instructions (Addendum)
I had a long d/w patient and his son about his recent  Post cardiac surgery strokes,mild cognitive impairment  risk for recurrent stroke/TIAs, personally independently reviewed imaging studies and stroke evaluation results and answered questions.Continue aspirin 325 mg daily  for secondary stroke prevention and maintain strict control of hypertension with blood pressure goal below 130/90, diabetes with hemoglobin A1c goal below 6.5% and lipids with LDL cholesterol goal below 70 mg/dL. I also advised the patient to eat a healthy diet with plenty of whole grains, cereals, fruits and vegetables, exercise regularly and maintain ideal body weight .we also discussed memory compensation strategies and I advised him to increase participation in cognitively challenging activities like solving crossword puzzles, playing sudoku and bridge.  Followup in the future with my nurse practitioner in 3 months or call earlier if necessary. Memory Compensation Strategies  1. Use "WARM" strategy.  W= write it down  A= associate it  R= repeat it  M= make a mental note  2.   You can keep a Glass blower/designerMemory Notebook.  Use a 3-ring notebook with sections for the following: calendar, important names and phone numbers,  medications, doctors' names/phone numbers, lists/reminders, and a section to journal what you did  each day.   3.    Use a calendar to write appointments down.  4.    Write yourself a schedule for the day.  This can be placed on the calendar or in a separate section of the Memory Notebook.  Keeping a  regular schedule can help memory.  5.    Use medication organizer with sections for each day or morning/evening pills.  You may need help loading it  6.    Keep a basket, or pegboard by the door.  Place items that you need to take out with you in the basket or on the pegboard.  You may also want to  include a message board for reminders.  7.    Use sticky notes.  Place sticky notes with reminders in a place where the  task is performed.  For example: " turn off the  stove" placed by the stove, "lock the door" placed on the door at eye level, " take your medications" on  the bathroom mirror or by the place where you normally take your medications.  8.    Use alarms/timers.  Use while cooking to remind yourself to check on food or as a reminder to take your medicine, or as a  reminder to make a call, or as a reminder to perform another task, etc.

## 2018-08-14 NOTE — Progress Notes (Signed)
A [facility] with requested records [facility] per request of Lowella Dandyrin Barrett, PA-C for Dr. Laneta SimmersBartle, s/p AVR/CABG 07/12/18.

## 2018-08-15 ENCOUNTER — Ambulatory Visit: Payer: BLUE CROSS/BLUE SHIELD | Admitting: Surgery

## 2018-08-15 DIAGNOSIS — R351 Nocturia: Secondary | ICD-10-CM | POA: Diagnosis not present

## 2018-08-15 DIAGNOSIS — N401 Enlarged prostate with lower urinary tract symptoms: Secondary | ICD-10-CM | POA: Diagnosis not present

## 2018-08-15 DIAGNOSIS — N411 Chronic prostatitis: Secondary | ICD-10-CM | POA: Diagnosis not present

## 2018-08-21 NOTE — Progress Notes (Signed)
Patient ID: Daniel Mann, male   DOB: 05-06-1951, 67 y.o.   MRN: [id number]     Cardiology Office Note   Date:  08/23/2018   ID:  Daniel Mann, DOB [birth year], MRN [id number]  PCP:  Blane Ohara, MD  Cardiologist:   Charlton Haws, MD   No chief complaint on file.     History of Present Illness: 67 y.o. with history of CAD and aortic stenosis. Had progression of his AS by TTE on 06/19/18 with mean gradient 41 mmHg Peak 103 mmHg. EF 60-65% Cath by Dr Katrinka Blazing 07/02/18 showed total occlusion of RCA and significant disease in LAD and  Large OM. Seen by Dr Laneta Simmers and operated on 07/12/18 with LIMA to LAD, SVG OM2, SVG RCA and AVR with 23 mm Edwards Inspiris Resilia pericardial valve. Post op course unremarkable No PAF.  D/c on ASA and beta blocker  Admitted 07/20/18 with , headache and change in MS. MRI with subacute small bilateral parietal and left frontal MA infarcts.   Also issues with prostatitis that he sees Dr Annabell Howells for D/c with just ASA To have outpatient neurology evaluation Seen by  CVTS 08/13/18 doing well back to baseline and doing cardiac rehab in [place]  Feels great has lot 60 lbs Walking over 1.5 hours/ day  No recurrent TIA symptoms   Past Medical History:  Diagnosis Date  . Aortic stenosis   . CHF (congestive heart failure) (HCC)   . GERD (gastroesophageal reflux disease)   . Heart murmur   . Hernia, inguinal, right   . Hyperlipidemia   . Hypertension   . Impaired fasting glucose   . Mixed hyperlipidemia   . Vitamin D deficiency     Past Surgical History:  Procedure Laterality Date  . AORTIC VALVE REPLACEMENT N/A 07/12/2018   Procedure: AORTIC VALVE REPLACEMENT (AVR) using an Inspiris Valve size  23;  Surgeon: [name], MD;  Location: [facility] OR;  Service: Open Heart Surgery;  Laterality: N/A;  . COLONOSCOPY    . CORONARY ARTERY BYPASS GRAFT N/A 07/12/2018   Procedure: CORONARY ARTERY BYPASS GRAFTING (CABG) times three using left internal mammary artery and right  greater saphenous vein harvested endoscopically.;  Surgeon: [name], MD;  Location: [facility] OR;  Service: Open Heart Surgery;  Laterality: N/A;  . RIGHT/LEFT HEART CATH AND CORONARY ANGIOGRAPHY N/A 07/02/2018   Procedure: RIGHT/LEFT HEART CATH AND CORONARY ANGIOGRAPHY;  Surgeon: Lyn Records, MD;  Location: [facility] INVASIVE CV LAB;  Service: Cardiovascular;  Laterality: N/A;  . TEE WITHOUT CARDIOVERSION N/A 07/12/2018   Procedure: TRANSESOPHAGEAL ECHOCARDIOGRAM (TEE);  Surgeon: [name], MD;  Location[facility];  Service: Open Heart Surgery;  Laterality: N/A;     Current Outpatient Medications  Medication Sig Dispense Refill  . acetaminophen (TYLENOL) 325 MG tablet Take 2 tablets (650 mg total) by mouth every 6 (six) hours as needed for mild pain.    Marland Kitchen aspirin EC 325 MG EC tablet Take 1 tablet (325 mg total) by mouth daily. 30 tablet 0  . ergocalciferol (VITAMIN D2) 1.25 MG (50000 UT) capsule Take 1 capsule (50,000 Units total) by mouth once a week. Saturdays & Wednesdays.    Marland Kitchen esomeprazole (NEXIUM) 40 MG capsule Take 40 mg by mouth daily before breakfast.    . metoprolol tartrate (LOPRESSOR) 25 MG tablet Take 1 tablet (25 mg total) by mouth 2 (two) times daily. 60 tablet 3  . Pitavastatin Calcium (LIVALO) 1 MG TABS Take 1 mg by mouth daily.     Marland Kitchen  lisinopril (PRINIVIL,ZESTRIL) 20 MG tablet Take 1 tablet (20 mg total) by mouth daily. 90 tablet 3   No current facility-administered medications for this visit.     Allergies:   Patient has no known allergies.    Social History:  The patient  reports that he has quit smoking. He has never used smokeless tobacco. He reports current alcohol use. He reports that he does not use drugs.   Family History:  The patient's family history includes Alzheimer's disease in his mother; Cancer - Prostate in his father; Renal Disease in his father.    ROS:  Please see the history of present illness.   Otherwise, review of systems are positive for none.    All other systems are reviewed and negative.    PHYSICAL EXAM: VS:  BP 138/88   Pulse 70   Ht 6' (1.829 m)   Wt 216 lb (98 kg)   BMI 29.29 kg/m  , BMI Body mass index is 29.29 kg/m. Affect appropriate Healthy:  appears stated age HEENT: normal Neck supple with no adenopathy JVP normal no bruits no thyromegaly Lungs clear with no wheezing and good diaphragmatic motion Heart:  S1/S2 SEM through AVR post sternotomy  Abdomen: benighn, BS positve, no tenderness, no AAA no bruit.  No HSM or HJR ventral hernia  Distal pulses intact with no bruits No edema post SVG harvesting for CABG  Neuro non-focal Skin warm and dry No muscular weakness     EKG:   NSR possible old IMI      06/10/16  SR rate 76 normal ECG  06/01/17 SR rate 76 normal  01/01/18 NSR rate 73 normal   Recent Labs: 07/13/2018: Magnesium 2.3 07/20/2018: ALT 32 07/21/2018: TSH 2.017 07/22/2018: BUN 11; Creatinine, Ser 0.85; Hemoglobin 9.0; Platelets 145; Potassium 4.2; Sodium 138    Lipid Panel    Component Value Date/Time   CHOL 111 07/22/2018 0553   TRIG 109 07/22/2018 0553   HDL 24 (L) 07/22/2018 0553   CHOLHDL 4.6 07/22/2018 0553   VLDL 22 07/22/2018 0553   LDLCALC 65 07/22/2018 0553      Wt Readings from Last 3 Encounters:  08/23/18 216 lb (98 kg)  08/14/18 215 lb 3.2 oz (97.6 kg)  08/13/18 208 lb (94.3 kg)      Other studies Reviewed: Additional studies/ records that were reviewed today include: [facility] Dr Sed[place] Mutaox.    ASSESSMENT AND PLAN:  1.  Aortic Stenosis :post 23 mm bioprosthetic tissue AVR 07/12/18 needs post TTE for EF and baseline gradients   2. HTN:  Previous ACE had been stopped restart lisinopril 20 mg daily   3. Chol: continue zocor  LDL 65   4. CVA:  Concern for occult PAF post op Did not have any in hospital MRI with infarcts though Discussed need for ILR and outpatient neurology f/u Deficits and confusion resolved will refer to EP cryptogenic stroke  loop recorder indicated     Disposition:   FU with me in 3 months  referred to EP for ILR consider f/u TTE to assess valve at that time     Signed, Charlton HawsPeter Jalan Fariss, MD  08/23/2018 3:42 PM    [facility] [address], [place], [place]  [address] Phone: [phone]; Fax: [phone]

## 2018-08-23 ENCOUNTER — Encounter

## 2018-08-23 ENCOUNTER — Ambulatory Visit (INDEPENDENT_AMBULATORY_CARE_PROVIDER_SITE_OTHER): Payer: BLUE CROSS/BLUE SHIELD | Admitting: Cardiovascular Disease

## 2018-08-23 VITALS — BP 138/88 | HR 70 | Ht 72.0 in | Wt 216.0 lb

## 2018-08-23 DIAGNOSIS — I480 Paroxysmal atrial fibrillation: Secondary | ICD-10-CM

## 2018-08-23 MED ORDER — LISINOPRIL 20 MG PO TABS: 20.0000 mg | ORAL_TABLET | Freq: Every day | ORAL | 3 refills | Status: DC

## 2018-08-23 NOTE — Patient Instructions (Signed)
Medication Instructions:  Your physician has recommended you make the following change in your medication:  1-START lisinopril 20 mg by mouth daily  Labwork: NONE  Testing/Procedures: NONE  Follow-Up: Your physician recommends that you schedule a follow-up appointment as soon as possible with EP for loop recorder.  Your physician wants you to follow-up in: 6 months with Dr. Eden EmmsNishan. You will receive a reminder letter in the mail two months in advance. If you don't receive a letter, please call our office to schedule the follow-up appointment.   If you need a refill on your cardiac medications before your next appointment, please call your pharmacy.

## 2018-09-17 ENCOUNTER — Encounter: Payer: Self-pay | Admitting: Internal Medicine

## 2018-09-17 ENCOUNTER — Ambulatory Visit (INDEPENDENT_AMBULATORY_CARE_PROVIDER_SITE_OTHER): Payer: BLUE CROSS/BLUE SHIELD | Admitting: Internal Medicine

## 2018-09-17 VITALS — BP 126/72 | HR 58 | Ht 72.0 in | Wt 219.6 lb

## 2018-09-17 DIAGNOSIS — I639 Cerebral infarction, unspecified: Secondary | ICD-10-CM | POA: Diagnosis not present

## 2018-09-17 NOTE — H&P (View-Only) (Signed)
Electrophysiology Office Note Date: 09/17/2018  ID:  Daniel Mann, Daniel Mann [birth year], MRN [id number]  PCP: Blane Ohara, MD Primary Cardiologist: Dr Eden Emms Electrophysiologist: Dr Johney Frame  CC: Evaluation of cryptogenic stroke.  Daniel Mann is a 68 y.o. male seen today for at the request of Dr Eden Emms for concern of afib in the setting of prior stroke. In October of 2019, patient underwent CABG and AVR. He had been doing very well and recovering nicely when his son noticed that Daniel Mann was acutely disoriented. He presented to the ER on 07/21/18. There was no slurred speech facial droop or focal weakness noted.  He did complain of mild slight frontal headache.  Patient underwent MRI scan of the brain which showed bilateral parietal, left frontal, left anterior temporal small infarcts which were felt to be subacute. He has had no further episodes of confusion. He is currently on ASA 325 mg daily. He denies any symptoms of heart racing or palpitations. He also denies snoring and daytime somnolence. Patient reports that he drinks about 3 glasses of wine per week. He is very active and exercises >1 hr per day on most days of the week.   He denies chest pain, palpitations, dyspnea, PND, orthopnea, nausea, vomiting, dizziness, syncope, edema, weight gain, or early satiety.  Past Medical History:  Diagnosis Date  . Aortic stenosis   . CHF (congestive heart failure) (HCC)   . GERD (gastroesophageal reflux disease)   . Heart murmur   . Hernia, inguinal, right   . Hyperlipidemia   . Hypertension   . Impaired fasting glucose   . Mixed hyperlipidemia   . Vitamin D deficiency    Past Surgical History:  Procedure Laterality Date  . AORTIC VALVE REPLACEMENT N/A 07/12/2018   Procedure: AORTIC VALVE REPLACEMENT (AVR) using an Inspiris Valve size  23;  Surgeon: [name], MD;  Location: [facility] OR;  Service: Open Heart Surgery;  Laterality: N/A;  . COLONOSCOPY    . CORONARY ARTERY BYPASS GRAFT N/A  07/12/2018   Procedure: CORONARY ARTERY BYPASS GRAFTING (CABG) times three using left internal mammary artery and right greater saphenous vein harvested endoscopically.;  Surgeon: [name], MD;  Location: [facility] OR;  Service: Open Heart Surgery;  Laterality: N/A;  . RIGHT/LEFT HEART CATH AND CORONARY ANGIOGRAPHY N/A 07/02/2018   Procedure: RIGHT/LEFT HEART CATH AND CORONARY ANGIOGRAPHY;  Surgeon: Lyn Records, MD;  [facility] INVAS[facility] LAB;  Service: Cardiovascular;  Laterality: N/A;  . TEE WITHOUT CARDIOVERSION N/A 07/12/2018   Procedure: TRANSESOPHAGEAL ECHOCARDIOGRAM (TEE);  Surgeon: [name], MD;  [facility];  Service: Open Heart Surgery;  Laterality: N/A;    Current Outpatient Medications  Medication Sig Dispense Refill  . acetaminophen (TYLENOL) 325 MG tablet Take 2 tablets (650 mg total) by mouth every 6 (six) hours as needed for mild pain.    Marland Kitchen aspirin EC 325 MG EC tablet Take 1 tablet (325 mg total) by mouth daily. 30 tablet 0  . ergocalciferol (VITAMIN D2) 1.25 MG (50000 UT) capsule Take 1 capsule (50,000 Units total) by mouth once a week. Saturdays & Wednesdays.    Marland Kitchen esomeprazole (NEXIUM) 40 MG capsule Take 40 mg by mouth daily before breakfast.    . lisinopril (PRINIVIL,ZESTRIL) 20 MG tablet Take 1 tablet (20 mg total) by mouth daily. 90 tablet 3  . metoprolol tartrate (LOPRESSOR) 25 MG tablet Take 1 tablet (25 mg total) by mouth 2 (two) times daily. 60 tablet 3  . Pitavastatin  Calcium (LIVALO) 1 MG TABS Take 1 mg by mouth daily.      No current facility-administered medications for this visit.     Allergies:   Patient has no known allergies.   Social History: Social History   Socioeconomic History  . Marital status: Widowed    Spouse name: Not on file  . Number of children: Not on file  . Years of education: Not on file  . Highest education level: Not on file  Occupational History  . Not on file  Social Needs  . Financial resource strain: Not on  file  . Food insecurity:    Worry: Not on file    Inability: Not on file  . Transportation needs:    Medical: Not on file    Non-medical: Not on file  Tobacco Use  . Smoking status: Former Games developer  . Smokeless tobacco: Never Used  Substance and Sexual Activity  . Alcohol use: Yes    Comment: WINE   . Drug use: No  . Sexual activity: Not on file  Lifestyle  . Physical activity:    Days per week: Not on file    Minutes per session: Not on file  . Stress: Not on file  Relationships  . Social connections:    Talks on phone: Not on file    Gets together: Not on file    Attends religious service: Not on file    Active member of club or organization: Not on file    Attends meetings of clubs or organizations: Not on file    Relationship status: Not on file  . Intimate partner violence:    Fear of current or ex partner: Not on file    Emotionally abused: Not on file    Physically abused: Not on file    Forced sexual activity: Not on file  Other Topics Concern  . Not on file  Social History Narrative  . Not on file    Family History: Family History  Problem Relation Age of Onset  . Alzheimer's disease Mother   . Renal Disease Father   . Cancer - Prostate Father     Review of Systems: All other systems reviewed and are otherwise negative except as noted above.   Physical Exam: VS:  BP 126/72   Pulse (!) 58   Ht 6' (1.829 m)   Wt 219 lb 9.6 oz (99.6 kg)   SpO2 99%   BMI 29.78 kg/m  , BMI Body mass index is 29.78 kg/m. Wt Readings from Last 3 Encounters:  09/17/18 219 lb 9.6 oz (99.6 kg)  08/23/18 216 lb (98 kg)  08/14/18 215 lb 3.2 oz (97.6 kg)    GEN- The patient is well appearing, alert and oriented x 3 today.   HEENT: normocephalic, atraumatic; sclera clear, conjunctiva pink; hearing intact; oropharynx clear; neck supple, no JVP Lymph- no cervical lymphadenopathy Lungs- Clear to ausculation bilaterally, normal work of breathing.  No wheezes, rales,  rhonchi Heart- Regular rate and rhythm, no murmurs, rubs or gallops, PMI not laterally displaced GI- soft, non-tender, non-distended, bowel sounds present, no hepatosplenomegaly Extremities- no clubbing, cyanosis, or edema; DP/PT/radial pulses 2+ bilaterally MS- no significant deformity or atrophy Skin- warm and dry, no rash or lesion  Psych- euthymic mood, full affect Neuro- strength and sensation are intact   EKG:  EKG is not ordered today. Review of previous ECGS and telemetry in Epic shows no atrial fibrillation.   Recent Labs: 07/13/2018: Magnesium 2.3 07/20/2018: ALT 32  07/21/2018: TSH 2.017 07/22/2018: BUN 11; Creatinine, Ser 0.85; Hemoglobin 9.0; Platelets 145; Potassium 4.2; Sodium 138    Other studies Reviewed: Additional studies/ records that were reviewed today include: Epic notes, TTE and TEE Review of the above records today demonstrates:   TTE 06/19/2018 - Left ventricle: The cavity size was normal. There was severe   focal basal hypertrophy. Systolic function was normal. The   estimated ejection fraction was in the range of 60% to 65%. Wall   motion was normal; there were no regional wall motion   abnormalities. Left ventricular diastolic function parameters   were normal. - Aortic valve: Trileaflet; severely thickened, severely calcified   leaflets. Valve mobility was restricted. There was severe   stenosis. Mean gradient (S): 41 mm Hg. VTI ratio of LVOT to   aortic valve: 0.27. - Mitral valve: There was trivial regurgitation. - Left atrium: The atrium was mildly dilated. - Pulmonic valve: There was trivial regurgitation.  TEE 07/12/2018  Left atrium: No spontaneous echo contrast.  Aortic valve: The valve is trileaflet. Severe valve calcification present. Severely decreased leaflet separation. Severe stenosis. Trace regurgitation.  Mitral valve: Mild regurgitation.  Right ventricle: Normal cavity size, wall thickness and ejection fraction.  Tricuspid  valve: Trace regurgitation.  Pulmonic valve: Trace regurgitation.  MRI Brain 07/20/2018 1. Subacute small bilateral parietal and LEFT frontal MCA versus posterior watershed territory infarcts. 2. Otherwise negative noncontrast MRI head for age.  Assessment and Plan:  1. Cryptogenic stroke Patient presented to ER with acute AMS after cardiac surgery MRI showed subacute small bilateral and left frontal MCA vs watershed territory infarcts. Dr Nishan is worried about the possiblity of occult atrial fibrillation. We dicussed options including implanting a loop recorder. Risks and benefits discussed including risk of bleeding and infection. Patient is agreeable and would like to have the ILR. We will proceed with ILR implantation at the next available time.  The patient would prefer in office implant if insurance will allow. If atrial fibrillation found, we will start anticoagulation for CHADS2VASC score of at least 5 (CAD, HTN, CVA/TIA, age).  2. CAD S/p bypass surgery No anginal symptoms Continue present therapy  3. HTN Stable, no changes today  4. Aortic valve disease S/p AVR 07/12/18 Followed by Dr Nishan    Disposition:   Follow up with me for ILR implant at the next available time  Signed, Kember Boch MD 09/17/2018 11:14 AM   [facility] [address] [place] [address] [phone] (office) [phone] (fax)  

## 2018-09-17 NOTE — Patient Instructions (Addendum)
Medication Instructions:  Your physician recommends that you continue on your current medications as directed. Please refer to the Current Medication list given to you today.  Labwork: None ordered.  Testing/Procedures: An implantable loop recorder is a small electronic device that is placed under the skin of your chest. It is about the size of an AA ("double A") battery. The device records the electrical activity of your heart over a long period of time. Your health care provider can download these recordings to monitor your heart. You may need an implantable loop recorder if you have periods of abnormal heart activity (arrhythmias) or unexplained fainting (syncope). The recorder can be left in place for 1 year or longer.  Follow-Up: I will call you when you are approved with your insurance. Boneta LucksJenny [phone]  Implantable Loop Recorder Placement  An implantable loop recorder is a small electronic device that is placed under the skin of your chest. It is about the size of an AA ("double A") battery. The device records the electrical activity of your heart over a long period of time. Your health care provider can download these recordings to monitor your heart. You may need an implantable loop recorder if you have periods of abnormal heart activity (arrhythmias) or unexplained fainting (syncope). The recorder can be left in place for 1 year or longer. Tell a health care provider about:  Any allergies you have.  All medicines you are taking, including vitamins, herbs, eye drops, creams, and over-the-counter medicines.  Any problems you or family members have had with anesthetic medicines.  Any blood disorders you have.  Any surgeries you have had.  Any medical conditions you have.  Whether you are pregnant or may be pregnant. What are the risks? Generally, this is a safe procedure. However, problems may occur, including:  Infection.  Bleeding.  Allergic reactions to anesthetic  medicines.  Damage to nerves or blood vessels.  Failure of the device to work. This could require another surgery to replace it. What happens before the procedure?   You may have a physical exam, blood tests, and imaging tests of your heart, such as a chest X-ray.  Follow instructions from your health care provider about eating or drinking restrictions.  Ask your health care provider about: ? Changing or stopping your regular medicines. This is especially important if you are taking diabetes medicines or blood thinners. ? Taking medicines such as aspirin and ibuprofen. These medicines can thin your blood. Do not take these medicines unless your health care provider tells you to take them. ? Taking over-the-counter medicines, vitamins, herbs, and supplements.  Ask your health care provider how your surgical site will be marked or identified.  Ask your health care provider what steps will be taken to help prevent infection. These may include: ? Removing hair at the surgery site. ? Washing skin with a germ-killing soap.  Plan to have someone take you home from the hospital or clinic.  Plan to have a responsible adult care for you for at least 24 hours after you leave the hospital or clinic. This is important.  Do not use any products that contain nicotine or tobacco, such as cigarettes and e-cigarettes. If you need help quitting, ask your health care provider. What happens during the procedure?  An IV will be inserted into one of your veins.  You may be given one or more of the following: ? A medicine to help you relax (sedative). ? A medicine to numb the area (local anesthetic).  A small incision will be made on the left side of your upper chest.  A pocket will be created under your skin.  The device will be placed in the pocket.  The incision will be closed with stitches (sutures) or adhesive strips.  A bandage (dressing) will be placed over the incision. The procedure may  vary among health care providers and hospitals. What happens after the procedure?  Your blood pressure, heart rate, breathing rate, and blood oxygen level will be monitored until you leave the hospital or clinic.  You may be able to go home on the day of your surgery. Before you go home: ? Your health care provider will program your recorder. ? You will learn how to trigger your device with a handheld activator. ? You will learn how to send recordings to your health care provider. ? You will get an ID card for your device, and you will be told when to use it.  Do not drive for 24 hours if you were given a sedative during your procedure. Summary  An implantable loop recorder is a small electronic device that is placed under the skin of your chest to monitor your heart over a long period of time.  The recorder can be left in place for 1 year or longer.  Plan to have someone take you home from the hospital or clinic. This information is not intended to replace advice given to you by your health care provider. Make sure you discuss any questions you have with your health care provider. Document Released: 08/10/2015 Document Revised: 10/14/2017 Document Reviewed: 10/14/2017 Elsevier Interactive Patient Education  2019 [employer]

## 2018-09-17 NOTE — Progress Notes (Signed)
Electrophysiology Office Note Date: 09/17/2018  ID:  Daniel, Mann [birth year], MRN [id number]  PCP: Blane Ohara, MD Primary Cardiologist: Dr Eden Emms Electrophysiologist: Dr Johney Frame  CC: Evaluation of cryptogenic stroke.  Daniel Mann is a 68 y.o. male seen today for at the request of Dr Eden Emms for concern of afib in the setting of prior stroke. In October of 2019, patient underwent CABG and AVR. He had been doing very well and recovering nicely when his son noticed that Mr Daniel Mann was acutely disoriented. He presented to the ER on 07/21/18. There was no slurred speech facial droop or focal weakness noted.  He did complain of mild slight frontal headache.  Patient underwent MRI scan of the brain which showed bilateral parietal, left frontal, left anterior temporal small infarcts which were felt to be subacute. He has had no further episodes of confusion. He is currently on ASA 325 mg daily. He denies any symptoms of heart racing or palpitations. He also denies snoring and daytime somnolence. Patient reports that he drinks about 3 glasses of wine per week. He is very active and exercises >1 hr per day on most days of the week.   He denies chest pain, palpitations, dyspnea, PND, orthopnea, nausea, vomiting, dizziness, syncope, edema, weight gain, or early satiety.  Past Medical History:  Diagnosis Date  . Aortic stenosis   . CHF (congestive heart failure) (HCC)   . GERD (gastroesophageal reflux disease)   . Heart murmur   . Hernia, inguinal, right   . Hyperlipidemia   . Hypertension   . Impaired fasting glucose   . Mixed hyperlipidemia   . Vitamin D deficiency    Past Surgical History:  Procedure Laterality Date  . AORTIC VALVE REPLACEMENT N/A 07/12/2018   Procedure: AORTIC VALVE REPLACEMENT (AVR) using an Inspiris Valve size  23;  Surgeon: [name], MD;  Location: [facility] OR;  Service: Open Heart Surgery;  Laterality: N/A;  . COLONOSCOPY    . CORONARY ARTERY BYPASS GRAFT N/A  07/12/2018   Procedure: CORONARY ARTERY BYPASS GRAFTING (CABG) times three using left internal mammary artery and right greater saphenous vein harvested endoscopically.;  Surgeon: [name], MD;  Location: [facility] OR;  Service: Open Heart Surgery;  Laterality: N/A;  . RIGHT/LEFT HEART CATH AND CORONARY ANGIOGRAPHY N/A 07/02/2018   Procedure: RIGHT/LEFT HEART CATH AND CORONARY ANGIOGRAPHY;  Surgeon: Lyn Records, MD;  Location: [facility] INVASIVE CV LAB;  Service: Cardiovascular;  Laterality: N/A;  . TEE WITHOUT CARDIOVERSION N/A 07/12/2018   Procedure: TRANSESOPHAGEAL ECHOCARDIOGRAM (TEE);  Surgeon: [name], MD;  Location: [facility];  Service: Open Heart Surgery;  Laterality: N/A;    Current Outpatient Medications  Medication Sig Dispense Refill  . acetaminophen (TYLENOL) 325 MG tablet Take 2 tablets (650 mg total) by mouth every 6 (six) hours as needed for mild pain.    Marland Kitchen aspirin EC 325 MG EC tablet Take 1 tablet (325 mg total) by mouth daily. 30 tablet 0  . ergocalciferol (VITAMIN D2) 1.25 MG (50000 UT) capsule Take 1 capsule (50,000 Units total) by mouth once a week. Saturdays & Wednesdays.    Marland Kitchen esomeprazole (NEXIUM) 40 MG capsule Take 40 mg by mouth daily before breakfast.    . lisinopril (PRINIVIL,ZESTRIL) 20 MG tablet Take 1 tablet (20 mg total) by mouth daily. 90 tablet 3  . metoprolol tartrate (LOPRESSOR) 25 MG tablet Take 1 tablet (25 mg total) by mouth 2 (two) times daily. 60 tablet 3  . Pitavastatin  Calcium (LIVALO) 1 MG TABS Take 1 mg by mouth daily.      No current facility-administered medications for this visit.     Allergies:   Patient has no known allergies.   Social History: Social History   Socioeconomic History  . Marital status: Widowed    Spouse name: Not on file  . Number of children: Not on file  . Years of education: Not on file  . Highest education level: Not on file  Occupational History  . Not on file  Social Needs  . Financial resource strain: Not on  file  . Food insecurity:    Worry: Not on file    Inability: Not on file  . Transportation needs:    Medical: Not on file    Non-medical: Not on file  Tobacco Use  . Smoking status: Former Games developer  . Smokeless tobacco: Never Used  Substance and Sexual Activity  . Alcohol use: Yes    Comment: WINE   . Drug use: No  . Sexual activity: Not on file  Lifestyle  . Physical activity:    Days per week: Not on file    Minutes per session: Not on file  . Stress: Not on file  Relationships  . Social connections:    Talks on phone: Not on file    Gets together: Not on file    Attends religious service: Not on file    Active member of club or organization: Not on file    Attends meetings of clubs or organizations: Not on file    Relationship status: Not on file  . Intimate partner violence:    Fear of current or ex partner: Not on file    Emotionally abused: Not on file    Physically abused: Not on file    Forced sexual activity: Not on file  Other Topics Concern  . Not on file  Social History Narrative  . Not on file    Family History: Family History  Problem Relation Age of Onset  . Alzheimer's disease Mother   . Renal Disease Father   . Cancer - Prostate Father     Review of Systems: All other systems reviewed and are otherwise negative except as noted above.   Physical Exam: VS:  BP 126/72   Pulse (!) 58   Ht 6' (1.829 m)   Wt 219 lb 9.6 oz (99.6 kg)   SpO2 99%   BMI 29.78 kg/m  , BMI Body mass index is 29.78 kg/m. Wt Readings from Last 3 Encounters:  09/17/18 219 lb 9.6 oz (99.6 kg)  08/23/18 216 lb (98 kg)  08/14/18 215 lb 3.2 oz (97.6 kg)    GEN- The patient is well appearing, alert and oriented x 3 today.   HEENT: normocephalic, atraumatic; sclera clear, conjunctiva pink; hearing intact; oropharynx clear; neck supple, no JVP Lymph- no cervical lymphadenopathy Lungs- Clear to ausculation bilaterally, normal work of breathing.  No wheezes, rales,  rhonchi Heart- Regular rate and rhythm, no murmurs, rubs or gallops, PMI not laterally displaced GI- soft, non-tender, non-distended, bowel sounds present, no hepatosplenomegaly Extremities- no clubbing, cyanosis, or edema; DP/PT/radial pulses 2+ bilaterally MS- no significant deformity or atrophy Skin- warm and dry, no rash or lesion  Psych- euthymic mood, full affect Neuro- strength and sensation are intact   EKG:  EKG is not ordered today. Review of previous ECGS and telemetry in Epic shows no atrial fibrillation.   Recent Labs: 07/13/2018: Magnesium 2.3 07/20/2018: ALT 32  07/21/2018: TSH 2.017 07/22/2018: BUN 11; Creatinine, Ser 0.85; Hemoglobin 9.0; Platelets 145; Potassium 4.2; Sodium 138    Other studies Reviewed: Additional studies/ records that were reviewed today include: Epic notes, TTE and TEE Review of the above records today demonstrates:   TTE 06/19/2018 - Left ventricle: The cavity size was normal. There was severe   focal basal hypertrophy. Systolic function was normal. The   estimated ejection fraction was in the range of 60% to 65%. Wall   motion was normal; there were no regional wall motion   abnormalities. Left ventricular diastolic function parameters   were normal. - Aortic valve: Trileaflet; severely thickened, severely calcified   leaflets. Valve mobility was restricted. There was severe   stenosis. Mean gradient (S): 41 mm Hg. VTI ratio of LVOT to   aortic valve: 0.27. - Mitral valve: There was trivial regurgitation. - Left atrium: The atrium was mildly dilated. - Pulmonic valve: There was trivial regurgitation.  TEE 07/12/2018  Left atrium: No spontaneous echo contrast.  Aortic valve: The valve is trileaflet. Severe valve calcification present. Severely decreased leaflet separation. Severe stenosis. Trace regurgitation.  Mitral valve: Mild regurgitation.  Right ventricle: Normal cavity size, wall thickness and ejection fraction.  Tricuspid  valve: Trace regurgitation.  Pulmonic valve: Trace regurgitation.  MRI Brain 07/20/2018 1. Subacute small bilateral parietal and LEFT frontal MCA versus posterior watershed territory infarcts. 2. Otherwise negative noncontrast MRI head for age.  Assessment and Plan:  1. Cryptogenic stroke Patient presented to ER with acute AMS after cardiac surgery MRI showed subacute small bilateral and left frontal MCA vs watershed territory infarcts. Dr Eden EmmsNishan is worried about the possiblity of occult atrial fibrillation. We dicussed options including implanting a loop recorder. Risks and benefits discussed including risk of bleeding and infection. Patient is agreeable and would like to have the ILR. We will proceed with ILR implantation at the next available time.  The patient would prefer in office implant if insurance will allow. If atrial fibrillation found, we will start anticoagulation for CHADS2VASC score of at least 5 (CAD, HTN, CVA/TIA, age).  2. CAD S/p bypass surgery No anginal symptoms Continue present therapy  3. HTN Stable, no changes today  4. Aortic valve disease S/p AVR 07/12/18 Followed by Dr Eden EmmsNishan    Disposition:   Follow up with me for ILR implant at the next available time  SignedHillis Range, Semaj Coburn MD 09/17/2018 11:14 AM   [facility] [address] [place] [address] [phone] (office) [phone] (fax)

## 2018-10-03 ENCOUNTER — Telehealth: Payer: Self-pay

## 2018-10-03 NOTE — Telephone Encounter (Signed)
Call placed to Pt.  Advised would need to complete loop implant at [facility] at this time.  Pt cannot make 10/04/2018.  Pt scheduled for loop implant on 10/09/2018 at 7:00 am.

## 2018-10-09 ENCOUNTER — Encounter (HOSPITAL_COMMUNITY): Payer: Self-pay | Admitting: Internal Medicine

## 2018-10-09 ENCOUNTER — Encounter (HOSPITAL_COMMUNITY)
Admission: RE | Disposition: A | Discharge status: Home / Self Care | Payer: Self-pay | Source: Home / Self Care | Attending: Internal Medicine

## 2018-10-09 ENCOUNTER — Ambulatory Visit (HOSPITAL_COMMUNITY)
Admission: RE | Admit: 2018-10-09 | Discharge: 2018-10-09 | Disposition: A | Discharge status: Home / Self Care | Payer: BLUE CROSS/BLUE SHIELD | Attending: Internal Medicine | Admitting: Internal Medicine

## 2018-10-09 ENCOUNTER — Other Ambulatory Visit: Payer: Self-pay

## 2018-10-09 DIAGNOSIS — I350 Nonrheumatic aortic (valve) stenosis: Secondary | ICD-10-CM | POA: Insufficient documentation

## 2018-10-09 DIAGNOSIS — K219 Gastro-esophageal reflux disease without esophagitis: Secondary | ICD-10-CM | POA: Diagnosis not present

## 2018-10-09 DIAGNOSIS — I509 Heart failure, unspecified: Secondary | ICD-10-CM | POA: Diagnosis not present

## 2018-10-09 DIAGNOSIS — I110 Hypertensive heart disease with heart failure: Secondary | ICD-10-CM | POA: Insufficient documentation

## 2018-10-09 DIAGNOSIS — Z79899 Other long term (current) drug therapy: Secondary | ICD-10-CM | POA: Insufficient documentation

## 2018-10-09 DIAGNOSIS — Z952 Presence of prosthetic heart valve: Secondary | ICD-10-CM | POA: Diagnosis not present

## 2018-10-09 DIAGNOSIS — I639 Cerebral infarction, unspecified: Secondary | ICD-10-CM | POA: Diagnosis not present

## 2018-10-09 DIAGNOSIS — Z87891 Personal history of nicotine dependence: Secondary | ICD-10-CM | POA: Insufficient documentation

## 2018-10-09 DIAGNOSIS — E782 Mixed hyperlipidemia: Secondary | ICD-10-CM | POA: Diagnosis not present

## 2018-10-09 DIAGNOSIS — I2510 Atherosclerotic heart disease of native coronary artery without angina pectoris: Secondary | ICD-10-CM | POA: Diagnosis not present

## 2018-10-09 DIAGNOSIS — I6389 Other cerebral infarction: Secondary | ICD-10-CM | POA: Diagnosis not present

## 2018-10-09 DIAGNOSIS — E559 Vitamin D deficiency, unspecified: Secondary | ICD-10-CM | POA: Diagnosis not present

## 2018-10-09 DIAGNOSIS — Z7982 Long term (current) use of aspirin: Secondary | ICD-10-CM | POA: Diagnosis not present

## 2018-10-09 DIAGNOSIS — Z951 Presence of aortocoronary bypass graft: Secondary | ICD-10-CM | POA: Insufficient documentation

## 2018-10-09 HISTORY — PX: LOOP RECORDER INSERTION: EP1214

## 2018-10-09 SURGERY — LOOP RECORDER INSERTION

## 2018-10-09 MED ORDER — LIDOCAINE-EPINEPHRINE 1 %-1:100000 IJ SOLN
INTRAMUSCULAR | Status: DC | PRN
  Administered 2018-10-09: 10 mL

## 2018-10-09 MED ORDER — LIDOCAINE-EPINEPHRINE 1 %-1:100000 IJ SOLN
INTRAMUSCULAR | Status: AC
  Filled 2018-10-09: qty 1

## 2018-10-09 SURGICAL SUPPLY — 2 items
LOOP REVEAL LINQSYS (Prosthesis & Implant Heart) ×1 IMPLANT
PACK LOOP INSERTION (CUSTOM PROCEDURE TRAY) ×2 IMPLANT

## 2018-10-09 NOTE — Progress Notes (Signed)
Voices understanding of discharge instructions.

## 2018-10-09 NOTE — Interval H&P Note (Signed)
History and Physical Interval Note:  10/09/2018 7:28 AM  Daniel Mann  has presented today for surgery, with the diagnosis of stroke  The various methods of treatment have been discussed with the patient and family. After consideration of risks, benefits and other options for treatment, the patient has consented to  Procedure(s): LOOP RECORDER INSERTION (N/A) as a surgical intervention .  The patient's history has been reviewed, patient examined, no change in status, stable for surgery.  I have reviewed the patient's chart and labs.  Questions were answered to the patient's satisfaction.     Hillis Range

## 2018-10-24 ENCOUNTER — Encounter: Payer: Self-pay | Admitting: Adult Health

## 2018-10-24 ENCOUNTER — Ambulatory Visit (INDEPENDENT_AMBULATORY_CARE_PROVIDER_SITE_OTHER): Payer: BLUE CROSS/BLUE SHIELD | Admitting: Nurse Practitioner

## 2018-10-24 DIAGNOSIS — I639 Cerebral infarction, unspecified: Secondary | ICD-10-CM

## 2018-10-24 NOTE — Progress Notes (Signed)
ILR wound check. Wound well healed. Last transmission received 10/21/18, pt asked to send manual transmission later today. Questions answered  Gypsy Balsam, NP 10/24/2018 9:09 AM

## 2018-11-01 ENCOUNTER — Other Ambulatory Visit (HOSPITAL_COMMUNITY): Payer: Self-pay | Admitting: Physician Assistant

## 2018-11-05 ENCOUNTER — Other Ambulatory Visit: Payer: Self-pay | Admitting: Cardiovascular Disease

## 2018-11-05 MED ORDER — METOPROLOL TARTRATE 25 MG PO TABS: 25.0000 mg | ORAL_TABLET | Freq: Two times a day (BID) | ORAL | 2 refills | Status: DC

## 2018-11-11 LAB — CUP PACEART REMOTE DEVICE CHECK
Implantable Pulse Generator Implant Date: 20200128
MDC IDC SESS DTM: 20200301134013

## 2018-11-12 ENCOUNTER — Ambulatory Visit (INDEPENDENT_AMBULATORY_CARE_PROVIDER_SITE_OTHER): Payer: BLUE CROSS/BLUE SHIELD | Admitting: *Deleted

## 2018-11-12 DIAGNOSIS — I639 Cerebral infarction, unspecified: Secondary | ICD-10-CM

## 2018-11-13 DIAGNOSIS — L578 Other skin changes due to chronic exposure to nonionizing radiation: Secondary | ICD-10-CM | POA: Diagnosis not present

## 2018-11-13 DIAGNOSIS — L821 Other seborrheic keratosis: Secondary | ICD-10-CM | POA: Diagnosis not present

## 2018-11-14 ENCOUNTER — Encounter: Payer: Self-pay | Admitting: Surgery

## 2018-11-14 ENCOUNTER — Ambulatory Visit (INDEPENDENT_AMBULATORY_CARE_PROVIDER_SITE_OTHER): Payer: BLUE CROSS/BLUE SHIELD | Admitting: Surgery

## 2018-11-14 ENCOUNTER — Ambulatory Visit: Payer: Medicare Other | Admitting: Adult Health

## 2018-11-14 ENCOUNTER — Other Ambulatory Visit: Payer: Self-pay

## 2018-11-14 VITALS — BP 124/70 | HR 64 | Resp 16 | Ht 72.0 in | Wt 218.0 lb

## 2018-11-14 DIAGNOSIS — Z953 Presence of xenogenic heart valve: Secondary | ICD-10-CM | POA: Diagnosis not present

## 2018-11-14 DIAGNOSIS — Z951 Presence of aortocoronary bypass graft: Secondary | ICD-10-CM

## 2018-11-14 NOTE — Progress Notes (Signed)
     HPI: The patient returns today for follow-up status post coronary bypass graft surgery x3 and aortic valve replacement using a 23 mm Edwards INSPIRIS RESILIA pericardial valve.  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  He is still been walking daily and going to the gym several times per week.  His stamina is better than it has been in years and he has had no chest pain or shortness of breath.  Current Outpatient Medications  Medication Sig Dispense Refill  . acetaminophen (TYLENOL) 325 MG tablet Take 2 tablets (650 mg total) by mouth every 6 (six) hours as needed for mild pain.    Marland Kitchen aspirin EC 325 MG EC tablet Take 1 tablet (325 mg total) by mouth daily. 30 tablet 0  . ergocalciferol (VITAMIN D2) 1.25 MG (50000 UT) capsule Take 1 capsule (50,000 Units total) by mouth once a week. Saturdays & Wednesdays.    Marland Kitchen esomeprazole (NEXIUM) 40 MG capsule Take 40 mg by mouth daily before breakfast.    . lisinopril (PRINIVIL,ZESTRIL) 20 MG tablet Take 1 tablet (20 mg total) by mouth daily. 90 tablet 3  . metoprolol tartrate (LOPRESSOR) 25 MG tablet Take 1 tablet (25 mg total) by mouth 2 (two) times daily. 180 tablet 2  . Pitavastatin Calcium (LIVALO) 1 MG TABS Take 1 mg by mouth daily.      No current facility-administered medications for this visit.      Physical Exam: BP 124/70 (BP Location: Left Arm, Patient Position: Sitting, Cuff Size: Large)   Pulse 64   Resp 16   Ht 6' (1.829 m)   Wt 218 lb (98.9 kg)   SpO2 99% Comment: ON RA  BMI 29.57 kg/m  He looks well. Cardiac exam shows a regular rate and rhythm with normal heart sounds. Lungs are clear. His incisions are well-healed.  Diagnostic Tests:  None today  Impression:  Mr. Tlatelpa has made an excellent recovery following his surgery.  He is back to full activity without restrictions.  Plan:  He will continue to follow-up with Dr. Eden Emms and his primary  physician and will call me if he has any problems with his incisions.   [name], MD [facility] [phone]

## 2018-11-19 NOTE — Progress Notes (Signed)
Carelink Summary Report / Loop Recorder 

## 2018-12-14 ENCOUNTER — Other Ambulatory Visit: Payer: Self-pay

## 2018-12-14 ENCOUNTER — Ambulatory Visit (INDEPENDENT_AMBULATORY_CARE_PROVIDER_SITE_OTHER): Payer: BLUE CROSS/BLUE SHIELD | Admitting: *Deleted

## 2018-12-14 DIAGNOSIS — I639 Cerebral infarction, unspecified: Secondary | ICD-10-CM

## 2018-12-14 LAB — CUP PACEART REMOTE DEVICE CHECK
Date Time Interrogation Session: 20200403143822
Implantable Pulse Generator Implant Date: 20200128

## 2018-12-20 NOTE — Progress Notes (Signed)
Carelink Summary Report / Loop Recorder 

## 2019-01-22 ENCOUNTER — Telehealth: Payer: Self-pay | Admitting: Internal Medicine

## 2019-01-22 NOTE — Telephone Encounter (Signed)
Pt has implantable loop recorder. No episodes to date. Monitor transmitting nightly. No episodes to date.  He also received notice from insurance that ILR was denied by [facility]. Will forward information over to our billing department to be sure that they are working on claim.   Gypsy Balsam, NP 01/22/2019 9:27 AM

## 2019-01-22 NOTE — Telephone Encounter (Signed)
New Message  Patient wants to speak to someone to make sure his pace maker is working properly.

## 2019-01-23 ENCOUNTER — Ambulatory Visit (INDEPENDENT_AMBULATORY_CARE_PROVIDER_SITE_OTHER): Payer: BLUE CROSS/BLUE SHIELD | Admitting: *Deleted

## 2019-01-23 ENCOUNTER — Other Ambulatory Visit: Payer: Self-pay

## 2019-01-23 DIAGNOSIS — I639 Cerebral infarction, unspecified: Secondary | ICD-10-CM | POA: Diagnosis not present

## 2019-01-23 DIAGNOSIS — I480 Paroxysmal atrial fibrillation: Secondary | ICD-10-CM

## 2019-01-23 LAB — CUP PACEART REMOTE DEVICE CHECK
Date Time Interrogation Session: 20200513074147
Implantable Pulse Generator Implant Date: 20200128

## 2019-01-31 ENCOUNTER — Telehealth: Payer: Self-pay | Admitting: Internal Medicine

## 2019-01-31 NOTE — Telephone Encounter (Signed)
Returned call to paient, advised that per account note by billing manager, claim is under review.

## 2019-01-31 NOTE — Telephone Encounter (Signed)
Pt says he had a procedure done by Dr. Johney Frame, and his insurance denied the procedure, as the procedure was deemed experimental not necessary.  He was hoping to have the office send [employer] more information.

## 2019-02-06 ENCOUNTER — Telehealth: Payer: Self-pay | Admitting: Cardiovascular Disease

## 2019-02-06 NOTE — Telephone Encounter (Signed)
New Message:   Pt said he would like for Dr Fabio Bering nurse to call, He said he had a question, but he would ask her when she called.

## 2019-02-06 NOTE — Telephone Encounter (Signed)
Patient stated Dr. Johney Frame has tired to address this twice. Now he is reaching out to Dr. Eden Emms.

## 2019-02-06 NOTE — Telephone Encounter (Signed)
Patient calling about his loop recorder being denied by [employer]. Patient stated that [employer] is calling this experimental and patient needs a doctor to explain to them, that this was necessary. Patient stated it is costing him $60,000 dollars out of pocket. Patient stated if he would have know it would be costing him this much he would not have done it. Patient stated he was told this was necessary after having his stroke. Will forward to Dr. Eden Emms to see if he can write a letter to [facility] about this issue.

## 2019-02-06 NOTE — Telephone Encounter (Signed)
Would refer/ ask Dr Johney Frame and Joice Lofts to address as they saw him on consult for cryptogenic stroke

## 2019-02-06 NOTE — Telephone Encounter (Signed)
Theres nothing else I can do I'm not putting the device in

## 2019-02-07 NOTE — Telephone Encounter (Signed)
Will send to Dr. Johney Frame and his nurse to see if there is anything they can do.

## 2019-02-08 NOTE — Telephone Encounter (Signed)
Daniel Mann with billing is aware and working on this patient's case.  I am told that appropriate information has been received.  I am happy to help anyway that I can.  He has an appropriately implanted device for the appropriate indication.  Hillis Range MD, [facility] 02/08/2019 4:50 PM

## 2019-02-12 NOTE — Progress Notes (Signed)
Carelink Summary Report / Loop Recorder 

## 2019-02-25 ENCOUNTER — Ambulatory Visit (INDEPENDENT_AMBULATORY_CARE_PROVIDER_SITE_OTHER): Payer: BC Managed Care – PPO | Admitting: *Deleted

## 2019-02-25 DIAGNOSIS — I639 Cerebral infarction, unspecified: Secondary | ICD-10-CM

## 2019-02-25 LAB — CUP PACEART REMOTE DEVICE CHECK
Date Time Interrogation Session: 20200615094155
Implantable Pulse Generator Implant Date: 20200128

## 2019-03-07 NOTE — Progress Notes (Signed)
Carelink Summary Report / Loop Recorder 

## 2019-03-24 DIAGNOSIS — H6123 Impacted cerumen, bilateral: Secondary | ICD-10-CM | POA: Diagnosis not present

## 2019-03-31 LAB — CUP PACEART REMOTE DEVICE CHECK
Date Time Interrogation Session: 20200718093537
Implantable Pulse Generator Implant Date: 20200128

## 2019-04-01 ENCOUNTER — Ambulatory Visit (INDEPENDENT_AMBULATORY_CARE_PROVIDER_SITE_OTHER): Payer: BC Managed Care – PPO | Admitting: *Deleted

## 2019-04-01 DIAGNOSIS — I639 Cerebral infarction, unspecified: Secondary | ICD-10-CM | POA: Diagnosis not present

## 2019-04-17 NOTE — Progress Notes (Signed)
Carelink Summary Report / Loop Recorder 

## 2019-05-02 ENCOUNTER — Ambulatory Visit (INDEPENDENT_AMBULATORY_CARE_PROVIDER_SITE_OTHER): Payer: BC Managed Care – PPO | Admitting: *Deleted

## 2019-05-02 DIAGNOSIS — I639 Cerebral infarction, unspecified: Secondary | ICD-10-CM

## 2019-05-02 LAB — CUP PACEART REMOTE DEVICE CHECK
Date Time Interrogation Session: 20200820100753
Implantable Pulse Generator Implant Date: 20200128

## 2019-05-10 NOTE — Progress Notes (Signed)
Carelink Summary Report / Loop Recorder 

## 2019-05-21 DIAGNOSIS — E782 Mixed hyperlipidemia: Secondary | ICD-10-CM | POA: Diagnosis not present

## 2019-05-21 DIAGNOSIS — I350 Nonrheumatic aortic (valve) stenosis: Secondary | ICD-10-CM | POA: Diagnosis not present

## 2019-05-21 DIAGNOSIS — Z23 Encounter for immunization: Secondary | ICD-10-CM | POA: Diagnosis not present

## 2019-05-21 DIAGNOSIS — R7301 Impaired fasting glucose: Secondary | ICD-10-CM | POA: Diagnosis not present

## 2019-05-21 DIAGNOSIS — I119 Hypertensive heart disease without heart failure: Secondary | ICD-10-CM | POA: Diagnosis not present

## 2019-06-04 ENCOUNTER — Ambulatory Visit (INDEPENDENT_AMBULATORY_CARE_PROVIDER_SITE_OTHER): Payer: BC Managed Care – PPO | Admitting: *Deleted

## 2019-06-04 DIAGNOSIS — I639 Cerebral infarction, unspecified: Secondary | ICD-10-CM

## 2019-06-04 LAB — CUP PACEART REMOTE DEVICE CHECK
Date Time Interrogation Session: 20200922100631
Implantable Pulse Generator Implant Date: 20200128

## 2019-06-17 NOTE — Progress Notes (Signed)
Patient ID: Daniel Mann, male   DOB: May 06, 1951, 68 y.o.   MRN: [id number]     Cardiology Office Note   Date:  06/21/2019   ID:  Daniel Mann, DOB May 06, 1951, MRN [id number]  PCP:  Daniel Mann, Kirsten, MD  Cardiologist:   Daniel HawsPeter Tristian Bouska, MD   No chief complaint on file.     History of Present Illness: 68 y.o. with history of CAD and aortic stenosis. Had progression of his AS by TTE on 06/19/18 with mean gradient 41 mmHg Peak 103 mmHg. EF 60-65% Cath by Dr Katrinka BlazingSmith 07/02/18 showed total occlusion of RCA and significant disease in LAD and  Large OM. Seen by Dr Daniel Mann and operated on 07/12/18 with LIMA to LAD, SVG OM2, SVG RCA and AVR with 23 mm Edwards Inspiris Resilia pericardial valve. Post op course unremarkable No PAF.  D/c on ASA and beta blocker  Admitted 07/20/18 with , headache and change in MS. MRI with subacute small bilateral parietal and left frontal MA infarcts.   Also issues with prostatitis that he sees Dr Daniel Mann for D/c with just ASA To have outpatient neurology evaluation Seen by  CVTS 08/13/18 doing well back to baseline and doing cardiac rehab in [place]  ILR placed by Dr Johney FrameAllred for cryptogenic stroke 10/09/18 to date no PAF detected  Some depression as wife and older son passed away last year Has large ventral hernia   Past Medical History:  Diagnosis Date  . Aortic stenosis   . CHF (congestive heart failure) (HCC)   . GERD (gastroesophageal reflux disease)   . Heart murmur   . Hernia, inguinal, right   . Hyperlipidemia   . Hypertension   . Impaired fasting glucose   . Mixed hyperlipidemia   . Vitamin D deficiency     Past Surgical History:  Procedure Laterality Date  . AORTIC VALVE REPLACEMENT N/A 07/12/2018   Procedure: AORTIC VALVE REPLACEMENT (AVR) using an Inspiris Valve size  23;  Surgeon: Alleen BorneBartle, Bryan K, MD;  Location: [facility] OR;  Service: Open Heart Surgery;  Laterality: N/A;  . COLONOSCOPY    . CORONARY ARTERY BYPASS GRAFT N/A 07/12/2018   Procedure: CORONARY  ARTERY BYPASS GRAFTING (CABG) times three using left internal mammary artery and right greater saphenous vein harvested endoscopically.;  Surgeon: Alleen BorneBartle, Bryan K, MD;  Location: [facility] OR;  Service: Open Heart Surgery;  Laterality: N/A;  . LOOP RECORDER INSERTION N/A 10/09/2018   Procedure: LOOP RECORDER INSERTION;  Surgeon: Hillis Mann, James, MD;  Location: [facility] INVASIVE CV LAB;  Service: Cardiovascular;  Laterality: N/A;  . RIGHT/LEFT HEART CATH AND CORONARY ANGIOGRAPHY N/A 07/02/2018   Procedure: RIGHT/LEFT HEART CATH AND CORONARY ANGIOGRAPHY;  Surgeon: Lyn RecordsSmith, Henry W, MD;  Location: [facility] INVAS[facility];  Service: Cardiovascular;  Laterality: N/A;  . TEE WITHOUT CARDIOVERSION N/A 07/12/2018   Procedure: TRANSESOPHAGEAL ECHOCARDIOGRAM (TEE);  Surgeon: Alleen BorneBartle, Bryan K, MD;  [facility];  Service: Open Heart Surgery;  Laterality: N/A;     Current Outpatient Medications  Medication Sig Dispense Refill  . acetaminophen (TYLENOL) 325 MG tablet Take 2 tablets (650 mg total) by mouth every 6 (six) hours as needed for mild pain.    Marland Kitchen. aspirin EC 325 MG EC tablet Take 1 tablet (325 mg total) by mouth daily. 30 tablet 0  . ergocalciferol (VITAMIN D2) 1.25 MG (50000 UT) capsule Take 1 capsule (50,000 Units total) by mouth once a week. Saturdays & Wednesdays.    Marland Kitchen. esomeprazole (NEXIUM) 40 MG capsule Take 40 mg by  mouth daily before breakfast.    . lisinopril (PRINIVIL,ZESTRIL) 20 MG tablet Take 1 tablet (20 mg total) by mouth daily. 90 tablet 3  . metoprolol tartrate (LOPRESSOR) 25 MG tablet TAKE 1 TABLET BY MOUTH TWICE A DAY 180 tablet 0  . Pitavastatin Calcium (LIVALO) 1 MG TABS Take 1 mg by mouth daily.      No current facility-administered medications for this visit.     Allergies:   Patient has no known allergies.    Social History:  The patient  reports that he has quit smoking. He has never used smokeless tobacco. He reports current alcohol use. He reports that he does not use drugs.   Family  History:  The patient's family history includes Alzheimer's disease in his mother; Cancer - Prostate in his father; Renal Disease in his father.    ROS:  Please see the history of present illness.   Otherwise, review of systems are positive for none.   All other systems are reviewed and negative.    PHYSICAL EXAM: VS:  BP 140/72   Pulse (!) 53   Ht 6' (1.829 m)   Wt 228 lb (103.4 kg)   SpO2 98%   BMI 30.92 kg/m  , BMI Body mass index is 30.92 kg/m. Affect appropriate Healthy:  appears stated age 16: normal Neck supple with no adenopathy JVP normal no bruits no thyromegaly Lungs clear with no wheezing and good diaphragmatic motion Heart:  S1/S2 SEM through AVR post sternotomy  Abdomen: benighn, large ventral hernia no bruit.  No HSM or HJR ventral hernia  Distal pulses intact with no bruits No edema post SVG harvesting for CABG  Neuro non-focal Skin warm and dry No muscular weakness     EKG:   NSR possible old IMI      06/10/16  SR rate 76 normal ECG  06/01/17 SR rate 76 normal  01/01/18 NSR rate 73 normal   Recent Labs: 07/13/2018: Magnesium 2.3 07/20/2018: ALT 32 07/21/2018: TSH 2.017 07/22/2018: BUN 11; Creatinine, Ser 0.85; Hemoglobin 9.0; Platelets 145; Potassium 4.2; Sodium 138    Lipid Panel    Component Value Date/Time   CHOL 111 07/22/2018 0553   TRIG 109 07/22/2018 0553   HDL 24 (L) 07/22/2018 0553   CHOLHDL 4.6 07/22/2018 0553   VLDL 22 07/22/2018 0553   LDLCALC 65 07/22/2018 0553      Wt Readings from Last 3 Encounters:  06/21/19 228 lb (103.4 kg)  11/14/18 218 lb (98.9 kg)  10/09/18 218 lb (98.9 kg)      Other studies Reviewed: Additional studies/ records that were reviewed today include: Records from [facility] practice Dr Tobie Poet.    ASSESSMENT AND PLAN:  1.  Aortic Stenosis :post 23 mm bioprosthetic tissue AVR 07/12/18  Needs post AVR echo for baseline gradient   2. HTN:  Previous ACE had been stopped restart lisinopril 20 mg daily    3. Chol: continue zocor  LDL 65   4. CVA:  Concern for occult PAF post op Did not have any in hospital MRI with infarcts though Discussed need for ILR placed and no PAF to date   5. Ventral Hernia:  Gave him Dr Molli Posey and Martin's name although it does not Hurt its fairly large and may warrant surgery   6. Depression:  Wife and older son both died a year ago He is lonely and living in house alone Marchia Meiers much works at [employer] 7 days /week has younger son Rodman Key that  works at [employer] 3rd shift     Disposition:   FU with me in 6 months     Signed, Daniel Haws, MD  06/21/2019 8:25 AM    [facility] [address] [place], [place], [place]  [address] Phone: [phone]; Fax: [phone]

## 2019-06-17 NOTE — Progress Notes (Signed)
Carelink Summary Report / Loop Recorder 

## 2019-06-20 ENCOUNTER — Other Ambulatory Visit: Payer: Self-pay | Admitting: Cardiovascular Disease

## 2019-06-21 ENCOUNTER — Encounter: Payer: Self-pay | Admitting: Cardiovascular Disease

## 2019-06-21 ENCOUNTER — Other Ambulatory Visit: Payer: Self-pay

## 2019-06-21 ENCOUNTER — Ambulatory Visit (INDEPENDENT_AMBULATORY_CARE_PROVIDER_SITE_OTHER): Payer: BC Managed Care – PPO | Admitting: Cardiovascular Disease

## 2019-06-21 VITALS — BP 140/72 | HR 53 | Ht 72.0 in | Wt 228.0 lb

## 2019-06-21 DIAGNOSIS — I350 Nonrheumatic aortic (valve) stenosis: Secondary | ICD-10-CM | POA: Diagnosis not present

## 2019-06-21 DIAGNOSIS — Z952 Presence of prosthetic heart valve: Secondary | ICD-10-CM

## 2019-06-21 NOTE — Patient Instructions (Signed)
Medication Instructions:   If you need a refill on your cardiac medications before your next appointment, please call your pharmacy.   Lab work:  If you have labs (blood work) drawn today and your tests are completely normal, you will receive your results only by: . MyChart Message (if you have MyChart) OR . A paper copy in the mail If you have any lab test that is abnormal or we need to change your treatment, we will call you to review the results.  Testing/Procedures: Your physician has requested that you have an echocardiogram. Echocardiography is a painless test that uses sound waves to create images of your heart. It provides your doctor with information about the size and shape of your heart and how well your heart's chambers and valves are working. This procedure takes approximately one hour. There are no restrictions for this procedure.  Follow-Up: At [facility], you and your health needs are our priority.  As part of our continuing mission to provide you with exceptional heart care, we have created designated Provider Care Teams.  These Care Teams include your primary Cardiologist (physician) and Advanced Practice Providers (APPs -  Physician Assistants and Nurse Practitioners) who all work together to provide you with the care you need, when you need it. You will need a follow up appointment in 6 months.  Please call our office 2 months in advance to schedule this appointment.  You may see Peter Nishan, MD or one of the following Advanced Practice Providers on your designated Care Team:   Lori Gerhardt, NP Laura Ingold, NP . Jill McDaniel, NP    

## 2019-07-07 LAB — CUP PACEART REMOTE DEVICE CHECK
Date Time Interrogation Session: 20201025100724
Implantable Pulse Generator Implant Date: 20200128

## 2019-07-08 ENCOUNTER — Ambulatory Visit (INDEPENDENT_AMBULATORY_CARE_PROVIDER_SITE_OTHER): Payer: BC Managed Care – PPO | Admitting: *Deleted

## 2019-07-08 DIAGNOSIS — I639 Cerebral infarction, unspecified: Secondary | ICD-10-CM

## 2019-07-26 NOTE — Progress Notes (Signed)
Carelink Summary Report / Loop Recorder 

## 2019-08-11 LAB — CUP PACEART REMOTE DEVICE CHECK
Date Time Interrogation Session: 20201127090155
Implantable Pulse Generator Implant Date: 20200128

## 2019-08-12 ENCOUNTER — Ambulatory Visit (INDEPENDENT_AMBULATORY_CARE_PROVIDER_SITE_OTHER): Payer: BC Managed Care – PPO | Admitting: *Deleted

## 2019-08-12 DIAGNOSIS — I639 Cerebral infarction, unspecified: Secondary | ICD-10-CM

## 2019-08-19 DIAGNOSIS — N401 Enlarged prostate with lower urinary tract symptoms: Secondary | ICD-10-CM | POA: Diagnosis not present

## 2019-08-19 DIAGNOSIS — N411 Chronic prostatitis: Secondary | ICD-10-CM | POA: Diagnosis not present

## 2019-08-19 DIAGNOSIS — R351 Nocturia: Secondary | ICD-10-CM | POA: Diagnosis not present

## 2019-08-26 DIAGNOSIS — N411 Chronic prostatitis: Secondary | ICD-10-CM | POA: Diagnosis not present

## 2019-08-29 ENCOUNTER — Ambulatory Visit: Payer: Self-pay | Admitting: Surgery

## 2019-08-29 DIAGNOSIS — K429 Umbilical hernia without obstruction or gangrene: Secondary | ICD-10-CM | POA: Diagnosis not present

## 2019-08-29 NOTE — H&P (Signed)
History of Present Illness Daniel Mann. Daniel Lamson Mann; 08/29/2019 5:46 PM) The patient is a 68 year old male who presents with an umbilical hernia. PCP - Dr. Blane Ohara Referred by Dr. Eden Emms for umbilical hernia  This is a 69 year old male with some cardiac issues status post open-heart surgery about a year and a half ago who presents with several years of an enlarging umbilical hernia. The patient states that this has become mildly uncomfortable. He denies any obstructive symptoms. The hernia sometimes becomes larger but still is partially reducible. There have been some skin changes with some of the umbilical skin becoming purple. No drainage from this area. The patient does not drink alcohol. No history of liver disease. His only current anticoagulation is full strength aspirin daily.   Problem List/Past Medical Daniel Hazard K. Daniel Lauter, Mann; 08/29/2019 5:46 PM) UMBILICAL HERNIA WITHOUT OBSTRUCTION OR GANGRENE (K42.9)  Past Surgical History (Daniel Mann, Mann; 08/29/2019 2:34 PM) Colon Polyp Removal - Open Coronary Artery Bypass Graft Valve Replacement  Diagnostic Studies History (Daniel Mann, Mann; 08/29/2019 2:34 PM) Colonoscopy 5-10 years ago  Allergies (Daniel Mann, Mann; 08/29/2019 2:34 PM) No Known Drug Allergies [08/29/2019]: Allergies Reconciled  Medication History (Daniel Mann, Mann; 08/29/2019 2:35 PM) Metoprolol Tartrate (25MG  Tablet, Oral) Active. Lisinopril (20MG  Tablet, Oral) Active. Aspirin (325MG  Tablet, Oral) Active. Medications Reconciled  Social History (Daniel A. , Mann; 08/29/2019 2:34 PM) Alcohol use Moderate alcohol use. Caffeine use Carbonated beverages, Coffee. No drug use Tobacco use Former smoker.  Family History (Daniel A. , Mann; 08/29/2019 2:34 PM) Depression Mother. Heart Disease Father. Hypertension Father. Kidney Disease Father. Prostate Cancer Father.  Other Problems 08/31/2019. Daniel Mann;  08/29/2019 5:46 PM) Enlarged Prostate Gastroesophageal Reflux Disease Heart murmur High blood pressure Hypercholesterolemia Inguinal Hernia Umbilical Hernia Repair     Review of Systems (Daniel Mann Mann; 08/29/2019 2:34 PM) General Not Present- Appetite Loss, Chills, Fatigue, Fever, Night Sweats, Weight Gain and Weight Loss. Skin Not Present- Change in Wart/Mole, Dryness, Hives, Jaundice, New Lesions, Non-Healing Wounds, Rash and Ulcer. HEENT Present- Wears glasses/contact lenses. Not Present- Earache, Hearing Loss, Hoarseness, Nose Bleed, Oral Ulcers, Ringing in the Ears, Seasonal Allergies, Sinus Pain, Sore Throat, Visual Disturbances and Yellow Eyes. Respiratory Not Present- Bloody sputum, Chronic Cough, Difficulty Breathing, Snoring and Wheezing. Breast Not Present- Breast Mass, Breast Pain, Nipple Discharge and Skin Changes. Gastrointestinal Not Present- Abdominal Pain, Bloating, Bloody Stool, Change in Bowel Habits, Chronic diarrhea, Constipation, Difficulty Swallowing, Excessive gas, Gets full quickly at meals, Hemorrhoids, Indigestion, Nausea, Rectal Pain and Vomiting. Male Genitourinary Present- Frequency and Nocturia. Not Present- Blood in Urine, Change in Urinary Stream, Impotence, Painful Urination, Urgency and Urine Leakage. Musculoskeletal Not Present- Back Pain, Joint Pain, Joint Stiffness, Muscle Pain, Muscle Weakness and Swelling of Extremities. Neurological Not Present- Decreased Memory, Fainting, Headaches, Numbness, Seizures, Tingling, Tremor, Trouble walking and Weakness. Psychiatric Not Present- Anxiety, Bipolar, Change in Sleep Pattern, Depression, Fearful and Frequent crying.  Vitals (Daniel Mann Mann; 08/29/2019 2:34 PM) 08/29/2019 2:34 PM Weight: 235.8 lb Height: 72in Body Surface Area: 2.28 m Body Mass Index: 31.98 kg/m  Temp.: 17F  Pulse: 83 (Regular)  BP: 126/74 (Sitting, Left Arm, Standard)        Physical Exam 08/31/2019  K. Emmalin Jaquess Mann; 08/29/2019 5:47 PM)  The physical exam findings are as follows: Note:WDWN in NAD Eyes: Pupils equal, round; sclera anicteric HENT: Oral mucosa moist; good dentition Neck: No masses palpated, no thyromegaly Lungs: CTA bilaterally; normal respiratory effort CV:  Regular rate and rhythm; no murmurs; extremities well-perfused with no edema Abd: +bowel sounds, soft, non-tender, no palpable organomegaly; large protruding umbilical hernia with patches of purplish skin. The hernia appears to contain only some fat. It is partially reducible. I cannot palpate the fascial defect. There is no skin breakdown or drainage from this area. Minimal tenderness. Skin: Warm, dry; no sign of jaundice Psychiatric - alert and oriented x 4; calm mood and affect    Assessment & Plan Daniel Mann K. Makisha Marrin Mann; [birth year]8109 3:15 PM)  UMBILICAL HERNIA WITHOUT OBSTRUCTION OR GANGRENE (K42.9)  Current Plans Schedule for Surgery - umbilical hernia repair with mesh. The surgical procedure has been discussed with the patient. Potential risks, benefits, alternative treatments, and expected outcomes have been explained. All of the patient's questions at this time have been answered. The likelihood of reaching the patient's treatment goal is good. The patient understand the proposed surgical procedure and wishes to proceed.  We will first obtain cardiac clearance.   Daniel Burn. Georgette Dover, Mann, [facility]  General/ Trauma Surgery   08/29/2019 5:48 PM

## 2019-08-30 ENCOUNTER — Telehealth: Payer: Self-pay | Admitting: *Deleted

## 2019-08-30 NOTE — Telephone Encounter (Signed)
   Primary Cardiologist: Jenkins Rouge, MD  Hi Dr. Johnsie Cancel,  Received preop request for umbilical hernia repair.   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 He had ILR placed 10/09/18 with no detection of PAF. He was last seen by you 06/21/19, noted ventral hernia and gave names of surgeons. He was doing well.  He has repeat echocardiogram scheduled for 09/16/2019 for one year follow up post AVR.    Would you like for him to have his echocardiogram prior to surgery? Would it be appropriate to hold aspirin prior to surgery? If so, what duration.  Thanks,  Daniel Dubonnet, NP 08/30/2019, 2:16 PM

## 2019-08-30 NOTE — Telephone Encounter (Signed)
   Primary Cardiologist: Jenkins Rouge, MD  Chart reviewed as part of pre-operative protocol coverage. Given past medical history and time since last visit, based on ACC/AHA guidelines, Daniel Mann would be at acceptable risk for the planned procedure without further cardiovascular testing.    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 He had ILR placed 10/09/18 with no detection of PAF. He was last seen by you 06/21/19,h was doing well.  Per his primary cardiologist, Dr. Johnsie Cancel, he may hold his aspirin 5 days prior to surgery if surgeon desires.   Daniel Mann is scheduled for echo 09/16/2019 for one year follow up of AVR. Per Dr. Johnsie Cancel his echocardiogram can be done before or after surgery - he does not need echo prior.  I will route this recommendation to the requesting party via Epic fax function and remove from pre-op pool.  Please call with questions.  Loel Dubonnet, NP 08/30/2019, 3:55 PM

## 2019-08-30 NOTE — Telephone Encounter (Signed)
   [facility] Pre-operative Risk Assessment    Request for surgical clearance:  1. What type of surgery is being performed? UMBILICAL HERNIA REPAIR WITH MESH   2. When is this surgery scheduled? TBD   3. What type of clearance is required (medical clearance vs. Pharmacy clearance to hold med vs. Both)? MEDICAL  4. Are there any medications that need to be held prior to surgery and how long? ASA   5. Practice name and name of physician performing surgery? [facility]; DR. Rodman Key TSUEI   6. What is your office phone number [phone]    7.   What is your office fax number [phone]  8.   Anesthesia type (None, local, MAC, general) ? LMA   Julaine Hua 08/30/2019, 11:34 AM  _________________________________________________________________   (provider comments below)

## 2019-08-30 NOTE — Telephone Encounter (Signed)
Ok to proceed with surgery can have echo after surgery if needed can hold aspirin for 5 days before surgery if surgeon desires

## 2019-09-04 NOTE — Progress Notes (Signed)
ILR remote 

## 2019-09-12 ENCOUNTER — Ambulatory Visit (INDEPENDENT_AMBULATORY_CARE_PROVIDER_SITE_OTHER): Payer: BC Managed Care – PPO | Admitting: *Deleted

## 2019-09-12 DIAGNOSIS — I639 Cerebral infarction, unspecified: Secondary | ICD-10-CM | POA: Diagnosis not present

## 2019-09-12 LAB — CUP PACEART REMOTE DEVICE CHECK
Date Time Interrogation Session: 20201230231918
Implantable Pulse Generator Implant Date: 20200128

## 2019-09-16 ENCOUNTER — Other Ambulatory Visit: Payer: Self-pay

## 2019-09-16 ENCOUNTER — Ambulatory Visit (HOSPITAL_COMMUNITY): Payer: BC Managed Care – PPO | Attending: Internal Medicine

## 2019-09-16 DIAGNOSIS — I350 Nonrheumatic aortic (valve) stenosis: Secondary | ICD-10-CM | POA: Diagnosis not present

## 2019-09-17 DIAGNOSIS — L578 Other skin changes due to chronic exposure to nonionizing radiation: Secondary | ICD-10-CM | POA: Diagnosis not present

## 2019-09-17 DIAGNOSIS — E782 Mixed hyperlipidemia: Secondary | ICD-10-CM | POA: Diagnosis not present

## 2019-09-17 DIAGNOSIS — L570 Actinic keratosis: Secondary | ICD-10-CM | POA: Diagnosis not present

## 2019-09-17 DIAGNOSIS — I350 Nonrheumatic aortic (valve) stenosis: Secondary | ICD-10-CM | POA: Diagnosis not present

## 2019-09-17 DIAGNOSIS — L821 Other seborrheic keratosis: Secondary | ICD-10-CM | POA: Diagnosis not present

## 2019-09-17 DIAGNOSIS — L820 Inflamed seborrheic keratosis: Secondary | ICD-10-CM | POA: Diagnosis not present

## 2019-09-17 DIAGNOSIS — R7301 Impaired fasting glucose: Secondary | ICD-10-CM | POA: Diagnosis not present

## 2019-09-17 DIAGNOSIS — I119 Hypertensive heart disease without heart failure: Secondary | ICD-10-CM | POA: Diagnosis not present

## 2019-09-25 ENCOUNTER — Other Ambulatory Visit: Payer: Self-pay

## 2019-09-25 ENCOUNTER — Encounter (HOSPITAL_BASED_OUTPATIENT_CLINIC_OR_DEPARTMENT_OTHER): Payer: Self-pay | Admitting: Surgery

## 2019-09-26 DIAGNOSIS — E782 Mixed hyperlipidemia: Secondary | ICD-10-CM | POA: Diagnosis not present

## 2019-09-26 DIAGNOSIS — E559 Vitamin D deficiency, unspecified: Secondary | ICD-10-CM | POA: Diagnosis not present

## 2019-09-26 DIAGNOSIS — N400 Enlarged prostate without lower urinary tract symptoms: Secondary | ICD-10-CM | POA: Diagnosis not present

## 2019-09-26 DIAGNOSIS — R7301 Impaired fasting glucose: Secondary | ICD-10-CM | POA: Diagnosis not present

## 2019-09-26 DIAGNOSIS — I119 Hypertensive heart disease without heart failure: Secondary | ICD-10-CM | POA: Diagnosis not present

## 2019-09-26 MED ORDER — ENSURE PRE-SURGERY PO LIQD: 296.0000 mL | Freq: Once | ORAL | Status: DC

## 2019-09-26 NOTE — Progress Notes (Signed)

## 2019-09-28 ENCOUNTER — Other Ambulatory Visit (HOSPITAL_COMMUNITY)
Admission: RE | Admit: 2019-09-28 | Discharge: 2019-09-28 | Disposition: A | Discharge status: Home / Self Care | Payer: BC Managed Care – PPO | Source: Ambulatory Visit | Attending: Surgery | Admitting: Surgery

## 2019-09-28 DIAGNOSIS — Z20822 Contact with and (suspected) exposure to covid-19: Secondary | ICD-10-CM | POA: Insufficient documentation

## 2019-09-28 DIAGNOSIS — Z01812 Encounter for preprocedural laboratory examination: Secondary | ICD-10-CM | POA: Diagnosis not present

## 2019-09-29 LAB — NOVEL CORONAVIRUS, NAA (HOSP ORDER, SEND-OUT TO REF LAB; TAT 18-24 HRS): SARS-CoV-2, NAA: NOT DETECTED

## 2019-10-02 ENCOUNTER — Ambulatory Visit (HOSPITAL_BASED_OUTPATIENT_CLINIC_OR_DEPARTMENT_OTHER): Payer: BC Managed Care – PPO | Admitting: Anesthesiology

## 2019-10-02 ENCOUNTER — Ambulatory Visit (HOSPITAL_BASED_OUTPATIENT_CLINIC_OR_DEPARTMENT_OTHER)
Admission: RE | Admit: 2019-10-02 | Discharge: 2019-10-02 | Disposition: A | Discharge status: Home / Self Care | Payer: BC Managed Care – PPO | Attending: Surgery | Admitting: Surgery

## 2019-10-02 ENCOUNTER — Encounter (HOSPITAL_BASED_OUTPATIENT_CLINIC_OR_DEPARTMENT_OTHER)
Admission: RE | Disposition: A | Discharge status: Home / Self Care | Payer: Self-pay | Source: Home / Self Care | Attending: Surgery

## 2019-10-02 ENCOUNTER — Other Ambulatory Visit: Payer: Self-pay

## 2019-10-02 ENCOUNTER — Encounter (HOSPITAL_BASED_OUTPATIENT_CLINIC_OR_DEPARTMENT_OTHER): Payer: Self-pay | Admitting: Surgery

## 2019-10-02 DIAGNOSIS — Z952 Presence of prosthetic heart valve: Secondary | ICD-10-CM | POA: Diagnosis not present

## 2019-10-02 DIAGNOSIS — Z7982 Long term (current) use of aspirin: Secondary | ICD-10-CM | POA: Insufficient documentation

## 2019-10-02 DIAGNOSIS — I2510 Atherosclerotic heart disease of native coronary artery without angina pectoris: Secondary | ICD-10-CM | POA: Insufficient documentation

## 2019-10-02 DIAGNOSIS — Z87891 Personal history of nicotine dependence: Secondary | ICD-10-CM | POA: Diagnosis not present

## 2019-10-02 DIAGNOSIS — K219 Gastro-esophageal reflux disease without esophagitis: Secondary | ICD-10-CM | POA: Insufficient documentation

## 2019-10-02 DIAGNOSIS — K429 Umbilical hernia without obstruction or gangrene: Secondary | ICD-10-CM | POA: Diagnosis not present

## 2019-10-02 DIAGNOSIS — E782 Mixed hyperlipidemia: Secondary | ICD-10-CM | POA: Diagnosis not present

## 2019-10-02 DIAGNOSIS — E7800 Pure hypercholesterolemia, unspecified: Secondary | ICD-10-CM | POA: Insufficient documentation

## 2019-10-02 DIAGNOSIS — I110 Hypertensive heart disease with heart failure: Secondary | ICD-10-CM | POA: Diagnosis not present

## 2019-10-02 DIAGNOSIS — I509 Heart failure, unspecified: Secondary | ICD-10-CM | POA: Insufficient documentation

## 2019-10-02 DIAGNOSIS — Z951 Presence of aortocoronary bypass graft: Secondary | ICD-10-CM | POA: Diagnosis not present

## 2019-10-02 DIAGNOSIS — Z79899 Other long term (current) drug therapy: Secondary | ICD-10-CM | POA: Diagnosis not present

## 2019-10-02 HISTORY — PX: INSERTION OF MESH: SHX5868

## 2019-10-02 HISTORY — PX: UMBILICAL HERNIA REPAIR: SHX196

## 2019-10-02 HISTORY — DX: Umbilical hernia without obstruction or gangrene: K42.9

## 2019-10-02 SURGERY — REPAIR, HERNIA, UMBILICAL, ADULT
Anesthesia: General | Site: Abdomen

## 2019-10-02 MED ORDER — PROPOFOL 10 MG/ML IV BOLUS
INTRAVENOUS | Status: DC | PRN
  Administered 2019-10-02: 180 mg via INTRAVENOUS

## 2019-10-02 MED ORDER — LIDOCAINE 2% (20 MG/ML) 5 ML SYRINGE
INTRAMUSCULAR | Status: DC | PRN
  Administered 2019-10-02: 50 mg via INTRAVENOUS

## 2019-10-02 MED ORDER — PHENYLEPHRINE 40 MCG/ML (10ML) SYRINGE FOR IV PUSH (FOR BLOOD PRESSURE SUPPORT)
PREFILLED_SYRINGE | INTRAVENOUS | Status: DC | PRN
  Administered 2019-10-02 (×4): 80 ug via INTRAVENOUS
  Administered 2019-10-02: 40 ug via INTRAVENOUS
  Administered 2019-10-02: 80 ug via INTRAVENOUS

## 2019-10-02 MED ORDER — EPHEDRINE SULFATE-NACL 50-0.9 MG/10ML-% IV SOSY
PREFILLED_SYRINGE | INTRAVENOUS | Status: DC | PRN
  Administered 2019-10-02: 10 mg via INTRAVENOUS
  Administered 2019-10-02: 5 mg via INTRAVENOUS
  Administered 2019-10-02: 10 mg via INTRAVENOUS
  Administered 2019-10-02: 5 mg via INTRAVENOUS
  Administered 2019-10-02: 10 mg via INTRAVENOUS
  Administered 2019-10-02: 5 mg via INTRAVENOUS

## 2019-10-02 MED ORDER — FENTANYL CITRATE (PF) 100 MCG/2ML IJ SOLN: 50.0000 ug | INTRAMUSCULAR | Status: DC | PRN

## 2019-10-02 MED ORDER — GABAPENTIN 300 MG PO CAPS
ORAL_CAPSULE | ORAL | Status: AC
  Filled 2019-10-02: qty 1

## 2019-10-02 MED ORDER — EPHEDRINE 5 MG/ML INJ
INTRAVENOUS | Status: AC
  Filled 2019-10-02: qty 10

## 2019-10-02 MED ORDER — CHLORHEXIDINE GLUCONATE CLOTH 2 % EX PADS: 6.0000 | MEDICATED_PAD | Freq: Once | CUTANEOUS | Status: DC

## 2019-10-02 MED ORDER — 0.9 % SODIUM CHLORIDE (POUR BTL) OPTIME
TOPICAL | Status: DC | PRN
  Administered 2019-10-02: 100 mL

## 2019-10-02 MED ORDER — PROMETHAZINE HCL 25 MG/ML IJ SOLN: 6.2500 mg | INTRAMUSCULAR | Status: DC | PRN

## 2019-10-02 MED ORDER — DEXAMETHASONE SODIUM PHOSPHATE 10 MG/ML IJ SOLN
INTRAMUSCULAR | Status: AC
  Filled 2019-10-02: qty 1

## 2019-10-02 MED ORDER — GABAPENTIN 300 MG PO CAPS
300.0000 mg | ORAL_CAPSULE | ORAL | Status: AC
  Administered 2019-10-02: 300 mg via ORAL

## 2019-10-02 MED ORDER — DEXAMETHASONE SODIUM PHOSPHATE 4 MG/ML IJ SOLN
INTRAMUSCULAR | Status: DC | PRN
  Administered 2019-10-02: 4 mg via INTRAVENOUS

## 2019-10-02 MED ORDER — CEFAZOLIN SODIUM-DEXTROSE 2-4 GM/100ML-% IV SOLN
INTRAVENOUS | Status: AC
  Filled 2019-10-02: qty 100

## 2019-10-02 MED ORDER — ACETAMINOPHEN 500 MG PO TABS
ORAL_TABLET | ORAL | Status: AC
  Filled 2019-10-02: qty 2

## 2019-10-02 MED ORDER — FENTANYL CITRATE (PF) 100 MCG/2ML IJ SOLN
INTRAMUSCULAR | Status: DC | PRN
  Administered 2019-10-02 (×2): 50 ug via INTRAVENOUS

## 2019-10-02 MED ORDER — FENTANYL CITRATE (PF) 100 MCG/2ML IJ SOLN
INTRAMUSCULAR | Status: AC
  Filled 2019-10-02: qty 2

## 2019-10-02 MED ORDER — LACTATED RINGERS IV SOLN: INTRAVENOUS | Status: DC

## 2019-10-02 MED ORDER — ACETAMINOPHEN 500 MG PO TABS
1000.0000 mg | ORAL_TABLET | ORAL | Status: AC
  Administered 2019-10-02: 1000 mg via ORAL

## 2019-10-02 MED ORDER — OXYCODONE HCL 5 MG PO TABS: 5.0000 mg | ORAL_TABLET | Freq: Once | ORAL | Status: DC | PRN

## 2019-10-02 MED ORDER — ONDANSETRON HCL 4 MG/2ML IJ SOLN
INTRAMUSCULAR | Status: AC
  Filled 2019-10-02: qty 2

## 2019-10-02 MED ORDER — BUPIVACAINE HCL (PF) 0.25 % IJ SOLN
INTRAMUSCULAR | Status: DC | PRN
  Administered 2019-10-02: 15 mL

## 2019-10-02 MED ORDER — PHENYLEPHRINE 40 MCG/ML (10ML) SYRINGE FOR IV PUSH (FOR BLOOD PRESSURE SUPPORT)
PREFILLED_SYRINGE | INTRAVENOUS | Status: AC
  Filled 2019-10-02: qty 10

## 2019-10-02 MED ORDER — FENTANYL CITRATE (PF) 100 MCG/2ML IJ SOLN: 25.0000 ug | INTRAMUSCULAR | Status: DC | PRN

## 2019-10-02 MED ORDER — LIDOCAINE-EPINEPHRINE 1 %-1:100000 IJ SOLN
INTRAMUSCULAR | Status: AC
  Filled 2019-10-02: qty 1

## 2019-10-02 MED ORDER — MIDAZOLAM HCL 2 MG/2ML IJ SOLN
INTRAMUSCULAR | Status: AC
  Filled 2019-10-02: qty 2

## 2019-10-02 MED ORDER — CEFAZOLIN SODIUM-DEXTROSE 2-4 GM/100ML-% IV SOLN
2.0000 g | INTRAVENOUS | Status: AC
  Administered 2019-10-02: 2 g via INTRAVENOUS

## 2019-10-02 MED ORDER — ONDANSETRON HCL 4 MG/2ML IJ SOLN
INTRAMUSCULAR | Status: DC | PRN
  Administered 2019-10-02: 4 mg via INTRAVENOUS

## 2019-10-02 MED ORDER — MIDAZOLAM HCL 2 MG/2ML IJ SOLN: 1.0000 mg | INTRAMUSCULAR | Status: DC | PRN

## 2019-10-02 MED ORDER — BUPIVACAINE HCL (PF) 0.25 % IJ SOLN
INTRAMUSCULAR | Status: AC
  Filled 2019-10-02: qty 30

## 2019-10-02 MED ORDER — LIDOCAINE 2% (20 MG/ML) 5 ML SYRINGE
INTRAMUSCULAR | Status: AC
  Filled 2019-10-02: qty 5

## 2019-10-02 MED ORDER — OXYCODONE HCL 5 MG/5ML PO SOLN: 5.0000 mg | Freq: Once | ORAL | Status: DC | PRN

## 2019-10-02 MED ORDER — MIDAZOLAM HCL 5 MG/5ML IJ SOLN
INTRAMUSCULAR | Status: DC | PRN
  Administered 2019-10-02: 2 mg via INTRAVENOUS

## 2019-10-02 MED ORDER — OXYCODONE HCL 5 MG PO TABS: 5.0000 mg | ORAL_TABLET | Freq: Four times a day (QID) | ORAL | 0 refills | Status: DC | PRN

## 2019-10-02 SURGICAL SUPPLY — 59 items
APL PRP STRL LF DISP 70% ISPRP (MISCELLANEOUS) ×2
APL SKNCLS STERI-STRIP NONHPOA (GAUZE/BANDAGES/DRESSINGS) ×2
APL SWBSTK 6 STRL LF DISP (MISCELLANEOUS)
APPLICATOR COTTON TIP 6 STRL (MISCELLANEOUS) IMPLANT
APPLICATOR COTTON TIP 6IN STRL (MISCELLANEOUS)
BENZOIN TINCTURE PRP APPL 2/3 (GAUZE/BANDAGES/DRESSINGS) ×3 IMPLANT
BLADE CLIPPER SURG (BLADE) ×3 IMPLANT
BLADE HEX COATED 2.75 (ELECTRODE) ×2 IMPLANT
BLADE SURG 15 STRL LF DISP TIS (BLADE) ×2 IMPLANT
BLADE SURG 15 STRL SS (BLADE) ×3
CANISTER SUCT 1200ML W/VALVE (MISCELLANEOUS) IMPLANT
CHLORAPREP W/TINT 26 (MISCELLANEOUS) ×3 IMPLANT
COVER BACK TABLE 60X90IN (DRAPES) ×3 IMPLANT
COVER MAYO STAND STRL (DRAPES) ×3 IMPLANT
COVER WAND RF STERILE (DRAPES) IMPLANT
DECANTER SPIKE VIAL GLASS SM (MISCELLANEOUS) IMPLANT
DRAPE LAPAROTOMY T 102X78X121 (DRAPES) ×3 IMPLANT
DRAPE UTILITY XL STRL (DRAPES) ×3 IMPLANT
DRSG TEGADERM 4X4.75 (GAUZE/BANDAGES/DRESSINGS) ×3 IMPLANT
ELECT REM PT RETURN 9FT ADLT (ELECTROSURGICAL) ×3
ELECTRODE REM PT RTRN 9FT ADLT (ELECTROSURGICAL) ×2 IMPLANT
GAUZE SPONGE 4X4 12PLY STRL LF (GAUZE/BANDAGES/DRESSINGS) IMPLANT
GLOVE BIO SURGEON STRL SZ7 (GLOVE) ×3 IMPLANT
GLOVE BIOGEL PI IND STRL 7.5 (GLOVE) ×2 IMPLANT
GLOVE BIOGEL PI INDICATOR 7.5 (GLOVE) ×1
GOWN STRL REUS W/ TWL LRG LVL3 (GOWN DISPOSABLE) ×4 IMPLANT
GOWN STRL REUS W/TWL LRG LVL3 (GOWN DISPOSABLE) ×6
MESH VENTRALEX ST 2.5 CRC MED (Mesh General) ×1 IMPLANT
NDL HYPO 25X1 1.5 SAFETY (NEEDLE) ×2 IMPLANT
NDL SAFETY ECLIPSE 18X1.5 (NEEDLE) IMPLANT
NEEDLE HYPO 18GX1.5 SHARP (NEEDLE)
NEEDLE HYPO 25X1 1.5 SAFETY (NEEDLE) ×3 IMPLANT
NS IRRIG 1000ML POUR BTL (IV SOLUTION) IMPLANT
PACK BASIN DAY SURGERY FS (CUSTOM PROCEDURE TRAY) ×3 IMPLANT
PENCIL SMOKE EVACUATOR (MISCELLANEOUS) ×3 IMPLANT
SLEEVE SCD COMPRESS KNEE MED (MISCELLANEOUS) ×3 IMPLANT
SPONGE GAUZE 2X2 8PLY STRL LF (GAUZE/BANDAGES/DRESSINGS) ×3 IMPLANT
STAPLER VISISTAT 35W (STAPLE) IMPLANT
STRIP CLOSURE SKIN 1/2X4 (GAUZE/BANDAGES/DRESSINGS) ×3 IMPLANT
SUT MNCRL AB 4-0 PS2 18 (SUTURE) ×3 IMPLANT
SUT NOVA 0 T19/GS 22DT (SUTURE) IMPLANT
SUT NOVA NAB DX-16 0-1 5-0 T12 (SUTURE) ×1 IMPLANT
SUT NOVA NAB GS-21 1 T12 (SUTURE) ×1 IMPLANT
SUT PDS AB 0 CT 36 (SUTURE) IMPLANT
SUT PROLENE 0 CT 2 (SUTURE) IMPLANT
SUT SILK 3 0 TIES 17X18 (SUTURE)
SUT SILK 3-0 18XBRD TIE BLK (SUTURE) IMPLANT
SUT VIC AB 2-0 CT1 27 (SUTURE)
SUT VIC AB 2-0 CT1 TAPERPNT 27 (SUTURE) IMPLANT
SUT VIC AB 3-0 SH 27 (SUTURE) ×3
SUT VIC AB 3-0 SH 27X BRD (SUTURE) ×2 IMPLANT
SUT VIC AB 4-0 BRD 54 (SUTURE) IMPLANT
SUT VIC AB 4-0 SH 18 (SUTURE) IMPLANT
SUT VICRYL 4-0 PS2 18IN ABS (SUTURE) IMPLANT
SYR BULB 3OZ (MISCELLANEOUS) IMPLANT
SYR CONTROL 10ML LL (SYRINGE) ×3 IMPLANT
TOWEL GREEN STERILE FF (TOWEL DISPOSABLE) ×3 IMPLANT
TUBE CONNECTING 20X1/4 (TUBING) IMPLANT
YANKAUER SUCT BULB TIP NO VENT (SUCTIONS) IMPLANT

## 2019-10-02 NOTE — Transfer of Care (Signed)
Immediate Anesthesia Transfer of Care Note  Patient: Daniel Mann  Procedure(s) Performed: UMBILICAL HERNIA REPAIR WITH MESH (N/A Abdomen)  Patient Location: PACU  Anesthesia Type:General  Level of Consciousness: drowsy  Airway & Oxygen Therapy: Patient Spontanous Breathing and Patient connected to face mask oxygen  Post-op Assessment: Report given to RN and Post -op Vital signs reviewed and stable  Post vital signs: Reviewed and stable  Last Vitals:  Vitals Value Taken Time  BP    Temp    Pulse    Resp    SpO2      Last Pain:  Vitals:   10/02/19 0713  TempSrc: Oral  PainSc: 0-No pain      Patients Stated Pain Goal: 3 (10/02/19 0713)  Complications: No apparent anesthesia complications

## 2019-10-02 NOTE — Anesthesia Procedure Notes (Signed)
Procedure Name: LMA Insertion Date/Time: 10/02/2019 8:36 AM Performed by: Caren Macadam, CRNA Pre-anesthesia Checklist: Patient identified, Emergency Drugs available, Suction available and Patient being monitored Patient Re-evaluated:Patient Re-evaluated prior to induction Oxygen Delivery Method: Circle system utilized Preoxygenation: Pre-oxygenation with 100% oxygen Induction Type: IV induction Ventilation: Mask ventilation without difficulty LMA: LMA inserted LMA Size: 5.0 Number of attempts: 1 Airway Equipment and Method: Bite block Placement Confirmation: positive ETCO2 and breath sounds checked- equal and bilateral Tube secured with: Tape Dental Injury: Teeth and Oropharynx as per pre-operative assessment

## 2019-10-02 NOTE — Discharge Instructions (Signed)
[facility] _______[facility]  UMBILICAL OR INGUINAL HERNIA REPAIR: POST OP INSTRUCTIONS  Always review your discharge instruction sheet given to you by the facility where your surgery was performed. IF YOU HAVE DISABILITY OR FAMILY LEAVE FORMS, YOU MUST BRING THEM TO THE OFFICE FOR PROCESSING.   DO NOT GIVE THEM TO YOUR DOCTOR.  1. A  prescription for pain medication may be given to you upon discharge.  Take your pain medication as prescribed, if needed.  If narcotic pain medicine is not needed, then you may take acetaminophen (Tylenol) or ibuprofen (Advil) as needed. 2. Take your usually prescribed medications unless otherwise directed. If you need a refill on your pain medication, please contact your pharmacy.  They will contact our office to request authorization. Prescriptions will not be filled after 5 pm or on week-ends. 3. You should follow a light diet the first 24 hours after arrival home, such as soup and crackers, etc.  Be sure to include lots of fluids daily.  Resume your normal diet the day after surgery. 4.Most patients will experience some swelling and bruising around the umbilicus or in the groin and scrotum.  Ice packs and reclining will help.  Swelling and bruising can take several days to resolve.  6. It is common to experience some constipation if taking pain medication after surgery.  Increasing fluid intake and taking a stool softener (such as Colace) will usually help or prevent this problem from occurring.  A mild laxative (Milk of Magnesia or Miralax) should be taken according to package directions if there are no bowel movements after 48 hours. 7. Unless discharge instructions indicate otherwise, you may remove your bandages 24-48 hours after surgery, and you may shower at that time.  You may have steri-strips (small skin tapes) in place directly over the incision.  These strips should be left on the skin for 7-10 days.  If your surgeon used skin glue on the  incision, you may shower in 24 hours.  The glue will flake off over the next 2-3 weeks.  Any sutures or staples will be removed at the office during your follow-up visit. 8. ACTIVITIES:  You may resume regular (light) daily activities beginning the next day--such as daily self-care, walking, climbing stairs--gradually increasing activities as tolerated.  You may have sexual intercourse when it is comfortable.  Refrain from any heavy lifting or straining until approved by your doctor.  a.You may drive when you are no longer taking prescription pain medication, you can comfortably wear a seatbelt, and you can safely maneuver your car and apply brakes. b.RETURN TO WORK:   _____________________________________________  9.You should see your doctor in the office for a follow-up appointment approximately 2-3 weeks after your surgery.  Make sure that you call for this appointment within a day or two after you arrive home to insure a convenient appointment time. 10.OTHER INSTRUCTIONS: _________________________    _____________________________________  WHEN TO CALL YOUR DOCTOR: 1. Fever over 101.0 2. Inability to urinate 3. Nausea and/or vomiting 4. Extreme swelling or bruising 5. Continued bleeding from incision. 6. Increased pain, redness, or drainage from the incision  The clinic staff is available to answer your questions during regular business hours.  Please don't hesitate to call and ask to speak to one of the nurses for clinical concerns.  If you have a medical emergency, go to the nearest emergency room or call 911.  A surgeon from [facility] is always on call at the hospital     [address], [place], [place]  [address] ?  [address] [address], [place], [place]   [address] [phone] ? [phone] ? FAX [phone] Web site: [email or web address]    Post Anesthesia Home Care Instructions  Activity: Get plenty of rest for the remainder of the day. A  responsible individual must stay with you for 24 hours following the procedure.  For the next 24 hours, DO NOT: -Drive a car -Advertising copywriter -Drink alcoholic beverages -Take any medication unless instructed by your physician -Make any legal decisions or sign important papers.  Meals: Start with liquid foods such as gelatin or soup. Progress to regular foods as tolerated. Avoid greasy, spicy, heavy foods. If nausea and/or vomiting occur, drink only clear liquids until the nausea and/or vomiting subsides. Call your physician if vomiting continues.  Special Instructions/Symptoms: Your throat may feel dry or sore from the anesthesia or the breathing tube placed in your throat during surgery. If this causes discomfort, gargle with warm salt water. The discomfort should disappear within 24 hours.  If you had a scopolamine patch placed behind your ear for the management of post- operative nausea and/or vomiting:  1. The medication in the patch is effective for 72 hours, after which it should be removed.  Wrap patch in a tissue and discard in the trash. Wash hands thoroughly with soap and water. 2. You may remove the patch earlier than 72 hours if you experience unpleasant side effects which may include dry mouth, dizziness or visual disturbances. 3. Avoid touching the patch. Wash your hands with soap and water after contact with the patch.  No tylenol until after 1pm today.

## 2019-10-02 NOTE — Op Note (Signed)
Indications:  The patient presented with a history of an enlarging umbilical hernia.  The patient was examined and we recommended umbilical hernia repair with mesh.  Pre-operative diagnosis:  Umbilical hernia  Post-operative diagnosis:  Same  Procedure:  Umbilical hernia repair with mesh  Procedure Details  The patient was seen again in the Holding Room. The risks, benefits, complications, treatment options, and expected outcomes were discussed with the patient. The possibilities of reaction to medication, pulmonary aspiration, perforation of viscus, bleeding, recurrent infection, the need for additional procedures, and development of a complication requiring transfusion or further operation were discussed with the patient and/or family. There was concurrence with the proposed plan, and informed consent was obtained. The site of surgery was properly noted/marked. The patient was taken to the Operating Room, identified as Daniel Mann, and the procedure verified as umbilical hernia repair. A Time Out was held and the above information confirmed.  After an adequate level of general anesthesia was obtained, the patient's abdomen was prepped with Chloraprep and draped in sterile fashion.  We made a transverse incision below the umbilicus.  Dissection was carried down to the hernia sac with cautery.  The hernia sac was very large and contained only omentum.  We opened the hernia sac and resected the protruding omentum between clamps with 2-0 silk sutures.  We reduced the hernia sac back into the pre-peritoneal space.  The fascial defect measured 2 cm.  I excised much of the hernia sac as well as some excess loose skin.  We cleared the fascia in all directions.  A medium Ventralex mesh was inserted into the pre-peritoneal space and was deployed.  The mesh was secured with four trans-fascial sutures of 0 Novofil.  The fascial defect was closed with multiple interrupted figure-of-eight 1 Novofil sutures.  The  middle of the incision was tacked down with 3-0 Vicryl.  3-0 Vicryl was used to close the subcutaneous tissues and 4-0 Monocryl was used to close the skin.  Steri-strips and clean dressing were applied.  The patient was extubated and brought to the recovery room in stable condition.  All sponge, instrument, and needle counts were correct prior to closure and at the conclusion of the case.   Estimated Blood Loss: Minimal          Complications: None; patient tolerated the procedure well.         Disposition: PACU - hemodynamically stable.         Condition: stable  [name], MD, [facility] Surgery  General/ Trauma Surgery   10/02/2019 9:39 AM

## 2019-10-02 NOTE — H&P (Signed)
History of Present Illness  The patient is a 69 year old male who presents with an umbilical hernia.   PCP - Dr. Rochel Brome Referred by Dr. Johnsie Cancel for umbilical hernia  This is a 69 year old male with some cardiac issues status post open-heart surgery about a year and a half ago who presents with several years of an enlarging umbilical hernia. The patient states that this has become mildly uncomfortable. He denies any obstructive symptoms. The hernia sometimes becomes larger but still is partially reducible. There have been some skin changes with some of the umbilical skin becoming purple. No drainage from this area. The patient does not drink alcohol. No history of liver disease. His only current anticoagulation is full strength aspirin daily.   Problem List/Past Medical UMBILICAL HERNIA WITHOUT OBSTRUCTION OR GANGRENE (K42.9)  Past Surgical History  Colon Polyp Removal - Open Coronary Artery Bypass Graft Valve Replacement  Diagnostic Studies History  Colonoscopy 5-10 years ago  Allergies  No Known Drug Allergies Allergies Reconciled  Medication History  Metoprolol Tartrate (25MG  Tablet, Oral) Active. Lisinopril (20MG  Tablet, Oral) Active. Aspirin (325MG  Tablet, Oral) Active. Medications Reconciled  Social History  Alcohol use Moderate alcohol use. Caffeine use Carbonated beverages, Coffee. No drug use Tobacco use Former smoker.  Family History  Depression Mother. Heart Disease Father. Hypertension Father. Kidney Disease Father. Prostate Cancer Father.  Other Problems  Enlarged Prostate Gastroesophageal Reflux Disease Heart murmur High blood pressure Hypercholesterolemia Inguinal Hernia Umbilical Hernia Repair     Review of Systems  General Not Present- Appetite Loss, Chills, Fatigue, Fever, Night Sweats, Weight Gain and Weight Loss. Skin Not Present- Change in Wart/Mole, Dryness, Hives, Jaundice, New Lesions,  Non-Healing Wounds, Rash and Ulcer. HEENT Present- Wears glasses/contact lenses. Not Present- Earache, Hearing Loss, Hoarseness, Nose Bleed, Oral Ulcers, Ringing in the Ears, Seasonal Allergies, Sinus Pain, Sore Throat, Visual Disturbances and Yellow Eyes. Respiratory Not Present- Bloody sputum, Chronic Cough, Difficulty Breathing, Snoring and Wheezing. Breast Not Present- Breast Mass, Breast Pain, Nipple Discharge and Skin Changes. Gastrointestinal Not Present- Abdominal Pain, Bloating, Bloody Stool, Change in Bowel Habits, Chronic diarrhea, Constipation, Difficulty Swallowing, Excessive gas, Gets full quickly at meals, Hemorrhoids, Indigestion, Nausea, Rectal Pain and Vomiting. Male Genitourinary Present- Frequency and Nocturia. Not Present- Blood in Urine, Change in Urinary Stream, Impotence, Painful Urination, Urgency and Urine Leakage. Musculoskeletal Not Present- Back Pain, Joint Pain, Joint Stiffness, Muscle Pain, Muscle Weakness and Swelling of Extremities. Neurological Not Present- Decreased Memory, Fainting, Headaches, Numbness, Seizures, Tingling, Tremor, Trouble walking and Weakness. Psychiatric Not Present- Anxiety, Bipolar, Change in Sleep Pattern, Depression, Fearful and Frequent crying.  Vitals  Weight: 235.8 lb Height: 72in Body Surface Area: 2.28 m Body Mass Index: 31.98 kg/m  Temp.: 19F  Pulse: 83 (Regular)  BP: 126/74 (Sitting, Left Arm, Standard)        Physical Exam   The physical exam findings are as follows: Note:WDWN in NAD Eyes: Pupils equal, round; sclera anicteric HENT: Oral mucosa moist; good dentition Neck: No masses palpated, no thyromegaly Lungs: CTA bilaterally; normal respiratory effort CV: Regular rate and rhythm; no murmurs; extremities well-perfused with no edema Abd: +bowel sounds, soft, non-tender, no palpable organomegaly; large protruding umbilical hernia with patches of purplish skin. The hernia appears to contain only  some fat. It is partially reducible. I cannot palpate the fascial defect. There is no skin breakdown or drainage from this area. Minimal tenderness. Skin: Warm, dry; no sign of jaundice Psychiatric - alert and oriented x 4; calm mood and  affect    Assessment & Plan   UMBILICAL HERNIA WITHOUT OBSTRUCTION OR GANGRENE (K42.9)  Current Plans Schedule for Surgery - umbilical hernia repair with mesh. The surgical procedure has been discussed with the patient. Potential risks, benefits, alternative treatments, and expected outcomes have been explained. All of the patient's questions at this time have been answered. The likelihood of reaching the patient's treatment goal is good. The patient understand the proposed surgical procedure and wishes to proceed.  We will first obtain cardiac clearance.   [name], MD, [facility] Surgery  General/ Trauma Surgery   10/02/2019 7:48 AM

## 2019-10-02 NOTE — Anesthesia Postprocedure Evaluation (Signed)
Anesthesia Post Note  Patient: Daniel Mann  Procedure(s) Performed: UMBILICAL HERNIA REPAIR WITH MESH (N/A Abdomen)     Patient location during evaluation: PACU Anesthesia Type: General Level of consciousness: awake and alert and oriented Pain management: pain level controlled Vital Signs Assessment: post-procedure vital signs reviewed and stable Respiratory status: spontaneous breathing, nonlabored ventilation and respiratory function stable Cardiovascular status: blood pressure returned to baseline Postop Assessment: no apparent nausea or vomiting Anesthetic complications: no    Last Vitals:  Vitals:   10/02/19 0936 10/02/19 0945  BP:  136/79  Pulse: 73 73  Resp: 14 20  Temp: 37.1 C   SpO2: 99% 100%    Last Pain:  Vitals:   10/02/19 0945  TempSrc:   PainSc: 5                  Kaylyn Layer

## 2019-10-02 NOTE — Anesthesia Preprocedure Evaluation (Addendum)
Anesthesia Evaluation  Patient identified by MRN, date of birth, ID band Patient awake    Reviewed: Allergy & Precautions, NPO status , Patient's Chart, lab work & pertinent test results, reviewed documented beta blocker date and time   History of Anesthesia Complications Negative for: history of anesthetic complications  Airway Mallampati: II  TM Distance: >3 FB Neck ROM: Full    Dental no notable dental hx.    Pulmonary neg pulmonary ROS, former smoker,    Pulmonary exam normal        Cardiovascular hypertension, Pt. on home beta blockers and Pt. on medications + CAD, + CABG (2019) and +CHF  Normal cardiovascular exam  TTE 09/16/19: EF 65-70%, mild LVH, mildly reduced RV systolic function, moderate LAE  Severe AS s/p AVR 2019   Neuro/Psych negative neurological ROS  negative psych ROS   GI/Hepatic Neg liver ROS, GERD  Medicated and Controlled,  Endo/Other  negative endocrine ROS  Renal/GU negative Renal ROS  negative genitourinary   Musculoskeletal negative musculoskeletal ROS (+)   Abdominal   Peds  Hematology  (+) anemia ,   Anesthesia Other Findings Day of surgery medications reviewed with patient.  Reproductive/Obstetrics negative OB ROS                            Anesthesia Physical Anesthesia Plan  ASA: III  Anesthesia Plan: General   Post-op Pain Management:    Induction: Intravenous  PONV Risk Score and Plan: 3 and Treatment may vary due to age or medical condition, Ondansetron and Dexamethasone  Airway Management Planned: LMA  Additional Equipment: None  Intra-op Plan:   Post-operative Plan: Extubation in OR  Informed Consent: I have reviewed the patients History and Physical, chart, labs and discussed the procedure including the risks, benefits and alternatives for the proposed anesthesia with the patient or authorized representative who has indicated his/her  understanding and acceptance.     Dental advisory given  Plan Discussed with: CRNA  Anesthesia Plan Comments:       Anesthesia Quick Evaluation

## 2019-10-03 ENCOUNTER — Encounter: Payer: Self-pay | Admitting: *Deleted

## 2019-10-03 LAB — SURGICAL PATHOLOGY

## 2019-10-09 DIAGNOSIS — N401 Enlarged prostate with lower urinary tract symptoms: Secondary | ICD-10-CM | POA: Diagnosis not present

## 2019-10-09 DIAGNOSIS — N411 Chronic prostatitis: Secondary | ICD-10-CM | POA: Diagnosis not present

## 2019-10-14 ENCOUNTER — Ambulatory Visit (INDEPENDENT_AMBULATORY_CARE_PROVIDER_SITE_OTHER): Payer: BC Managed Care – PPO | Admitting: *Deleted

## 2019-10-14 DIAGNOSIS — I639 Cerebral infarction, unspecified: Secondary | ICD-10-CM | POA: Diagnosis not present

## 2019-10-14 LAB — CUP PACEART REMOTE DEVICE CHECK
Date Time Interrogation Session: 20210201004505
Implantable Pulse Generator Implant Date: 20200128

## 2019-10-15 NOTE — Progress Notes (Signed)
ILR Remote 

## 2019-11-14 ENCOUNTER — Ambulatory Visit (INDEPENDENT_AMBULATORY_CARE_PROVIDER_SITE_OTHER): Payer: BC Managed Care – PPO | Admitting: *Deleted

## 2019-11-14 DIAGNOSIS — I639 Cerebral infarction, unspecified: Secondary | ICD-10-CM

## 2019-11-14 LAB — CUP PACEART REMOTE DEVICE CHECK
Date Time Interrogation Session: 20210304031542
Implantable Pulse Generator Implant Date: 20200128

## 2019-11-14 NOTE — Progress Notes (Signed)
ILR Remote 

## 2019-11-21 DIAGNOSIS — H2512 Age-related nuclear cataract, left eye: Secondary | ICD-10-CM | POA: Diagnosis not present

## 2019-11-21 DIAGNOSIS — H25043 Posterior subcapsular polar age-related cataract, bilateral: Secondary | ICD-10-CM | POA: Diagnosis not present

## 2019-11-21 DIAGNOSIS — H25013 Cortical age-related cataract, bilateral: Secondary | ICD-10-CM | POA: Diagnosis not present

## 2019-11-21 DIAGNOSIS — H2513 Age-related nuclear cataract, bilateral: Secondary | ICD-10-CM | POA: Diagnosis not present

## 2019-11-21 DIAGNOSIS — H18413 Arcus senilis, bilateral: Secondary | ICD-10-CM | POA: Diagnosis not present

## 2019-11-21 IMAGING — DX DG CHEST 1V PORT
1 series · 1 of 1 positions shown · non-contrast
Comparison: One-view chest x-ray 07/13/2018

CLINICAL DATA: Aortic valve replacement 2 days ago.

EXAM:
PORTABLE CHEST 1 VIEW

[chest]
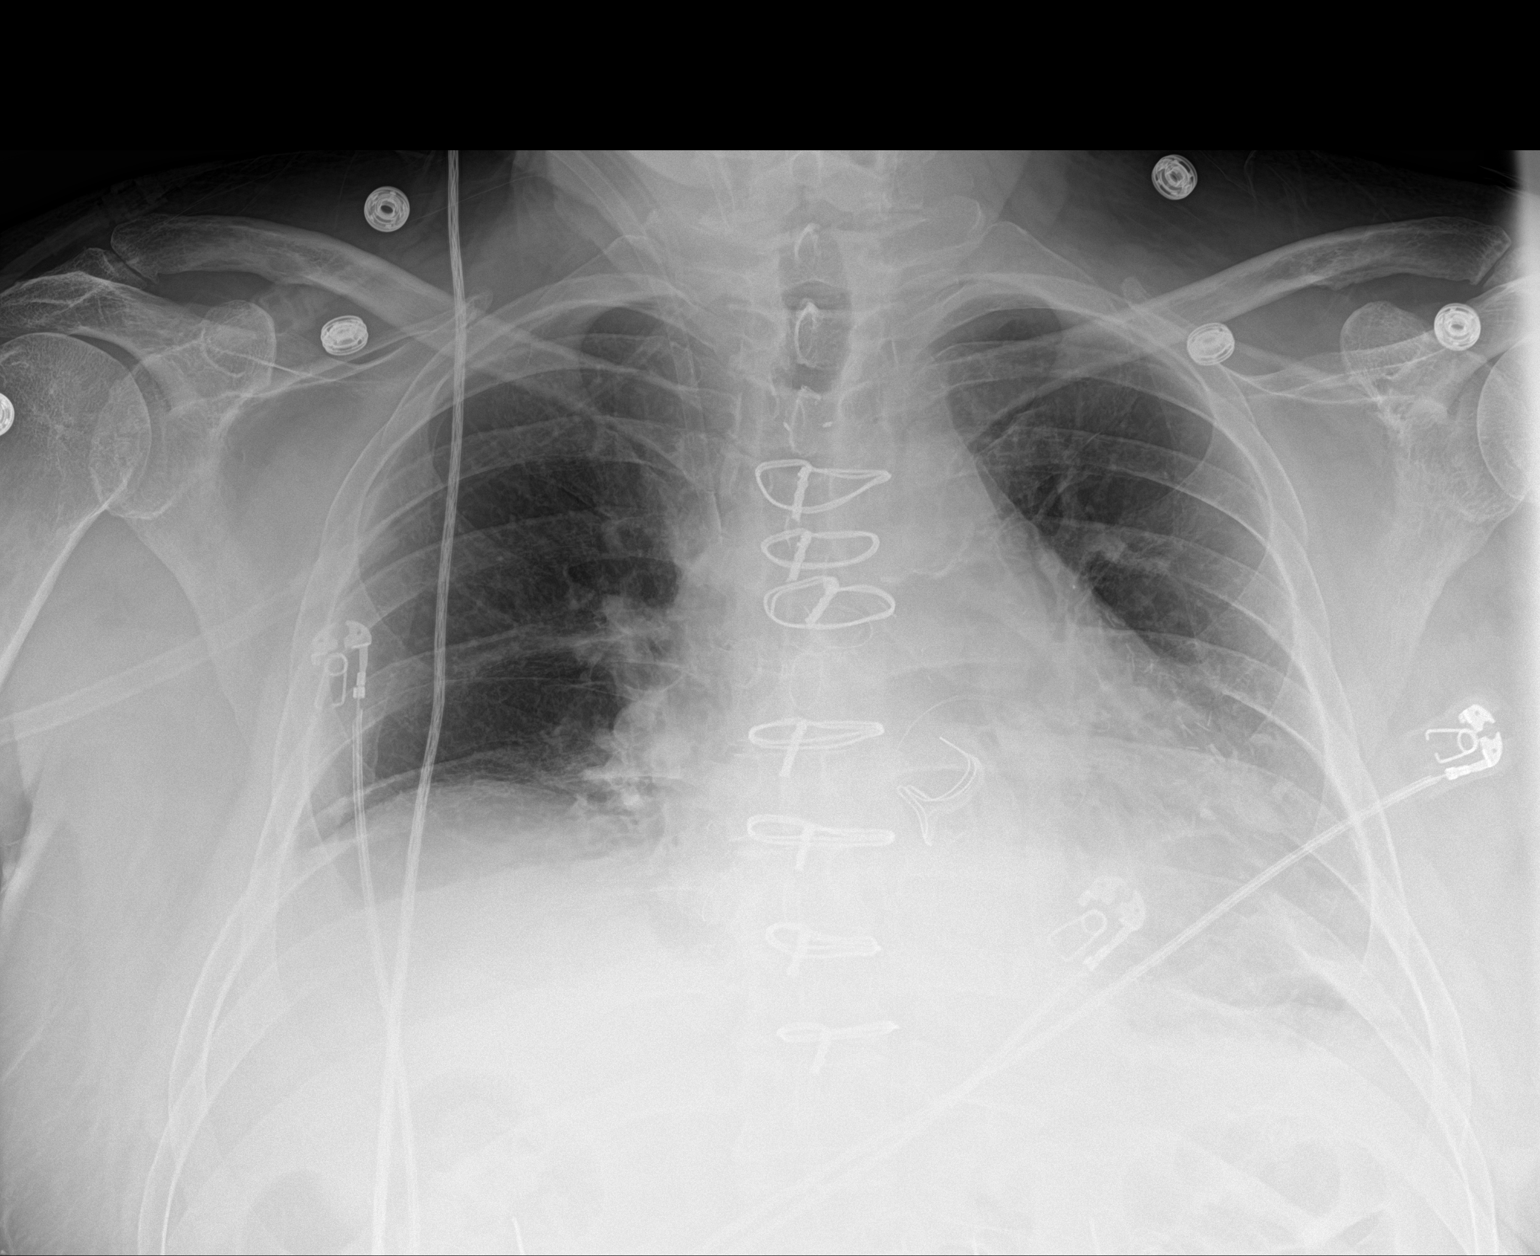

[1 of 1 positions shown; findings below may reference images not displayed]

FINDINGS: The Swan-Ganz catheter was removed. A right IJ sheath remains.
Mediastinal drain and left-sided chest tube are removed. There is no
significant pneumothorax or pneumomediastinum.

Aortic valve replacement is noted.

Lung volumes are low. The heart is enlarged. Bibasilar airspace
disease likely reflects atelectasis without significant interval
change.
IMPRESSION: 1. Interval removal of left-sided chest tube and mediastinal drain
without pneumothorax.
2. Interval removal Swan-Ganz catheter.
3. Stable low lung volumes.
4. Cardiomegaly and bibasilar airspace disease, likely atelectasis,
are stable.

## 2019-11-23 IMAGING — DX DG CHEST 2V
2 series · 2 of 2 positions shown · non-contrast
Comparison: Portable chest x-ray July 14, 2018

CLINICAL DATA: Follow-up left pleural effusion and atelectasis.

EXAM:
CHEST - 2 VIEW

[chest pa]
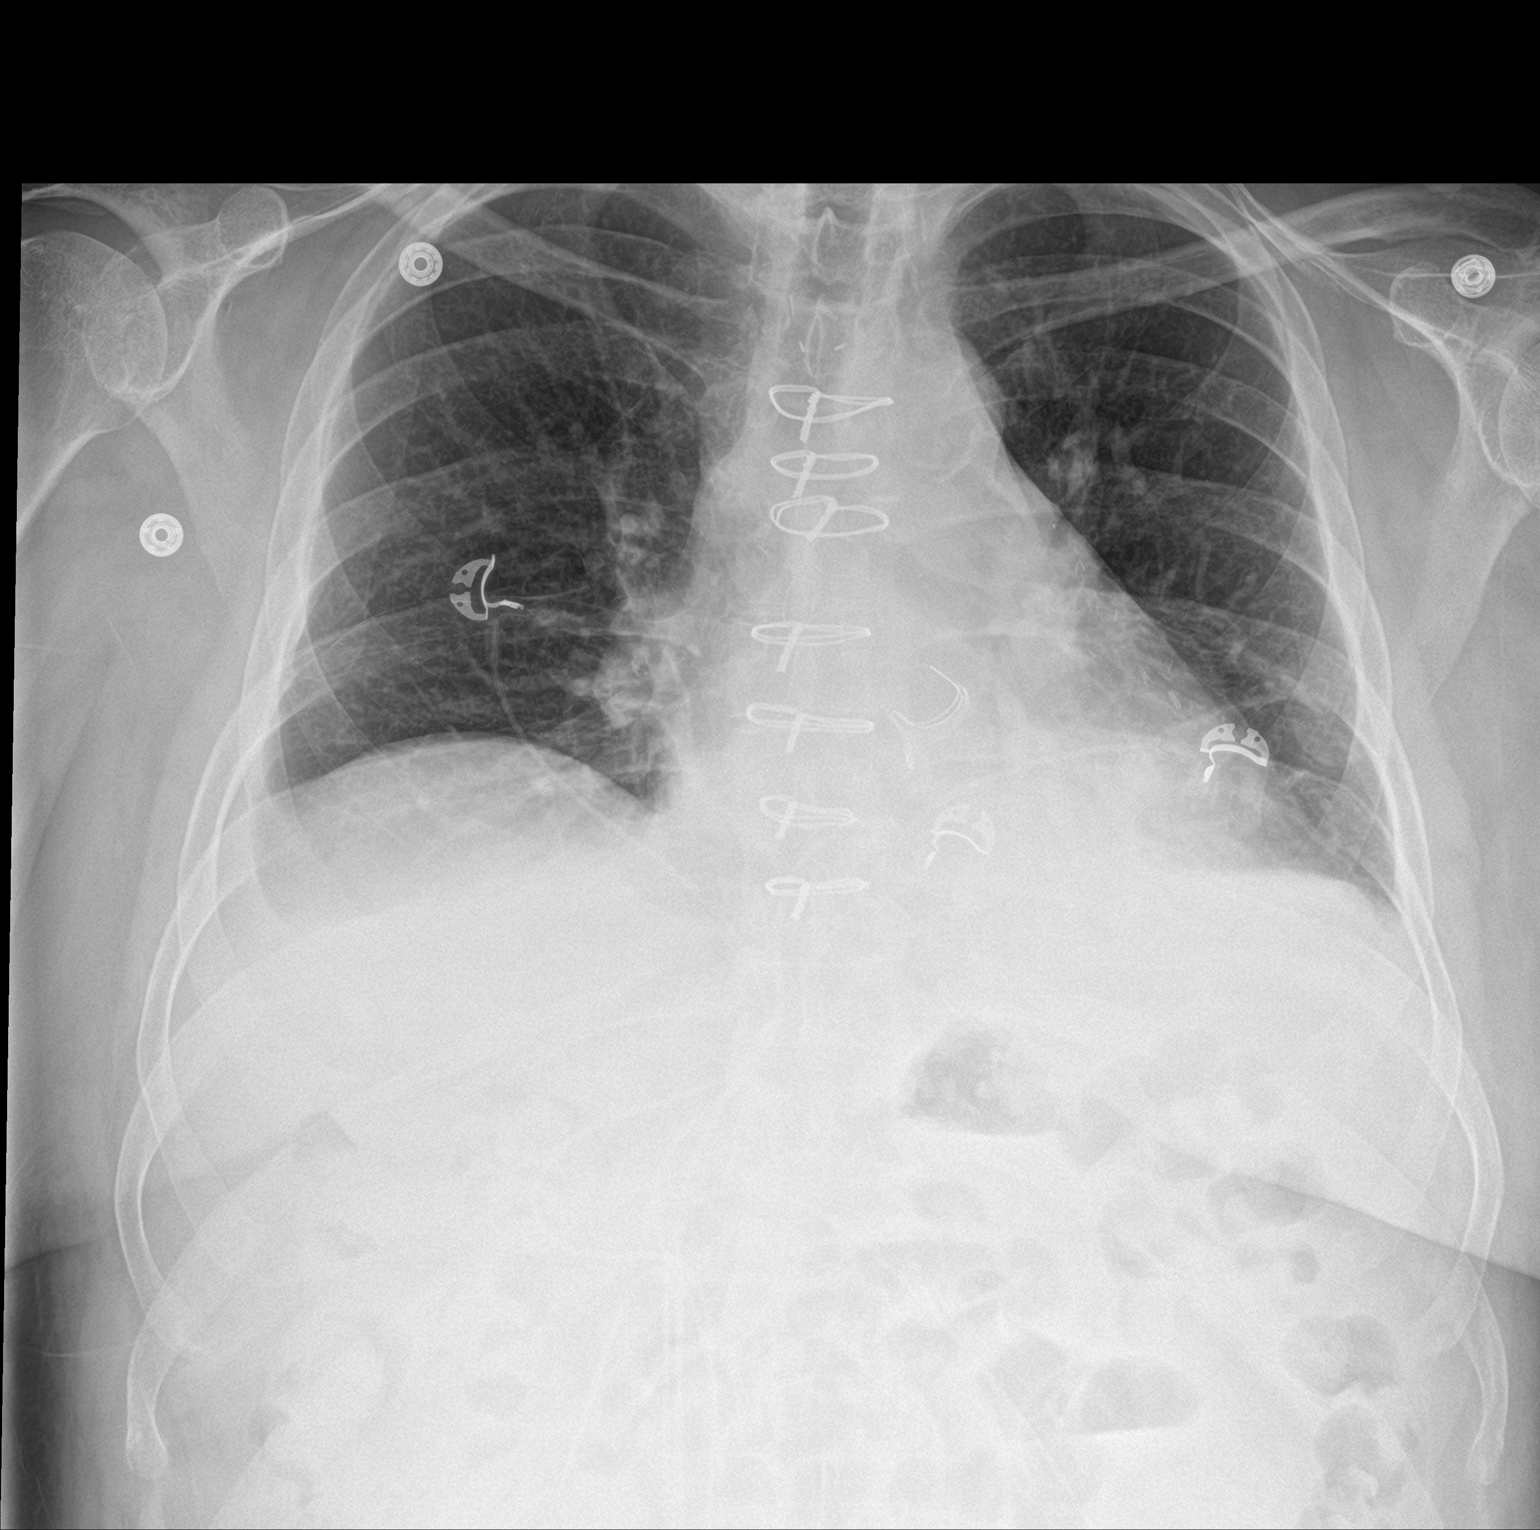

[chest lat]
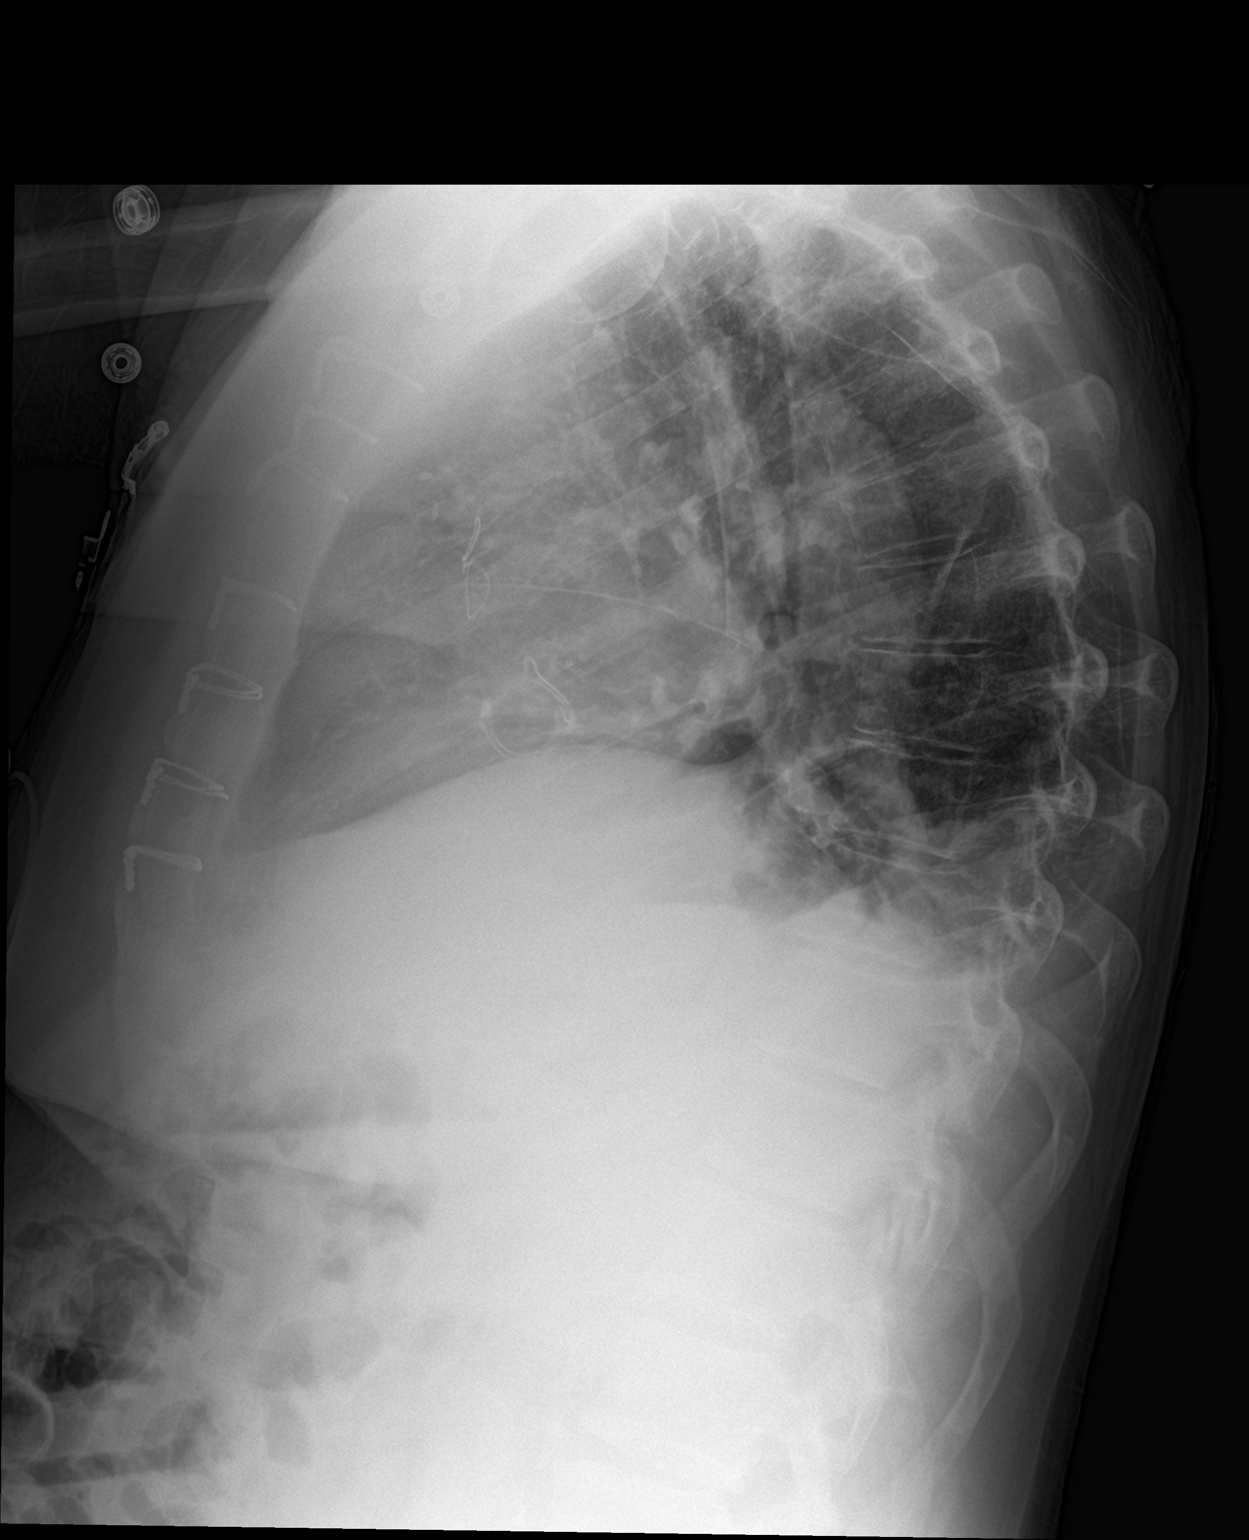

[2 of 2 positions shown; findings below may reference images not displayed]

FINDINGS: The lungs are slightly better inflated today. There remain small
bilateral pleural effusions greatest on the right. There is left
lower lobe atelectasis or pneumonia which has improved somewhat. The
heart is top-normal in size. A prosthetic aortic valve is present.
The sternal wires are intact. The pulmonary vascularity is not
engorged. There is calcification in the wall of the aortic arch. The
right internal jugular Cordis sheath has been removed.
IMPRESSION: Improving left basilar atelectasis or pneumonia. Persistent small
bilateral pleural effusions greatest on the right. No pulmonary
edema.

Thoracic aortic atherosclerosis.

## 2019-11-27 IMAGING — CR DG CHEST 2V
2 series · 2 of 2 positions shown · non-contrast
Comparison: 07/16/2018

CLINICAL DATA: Confusion weakness. Chest pain and
shortness-of-breath.

EXAM:
CHEST - 2 VIEW

[chest lat]
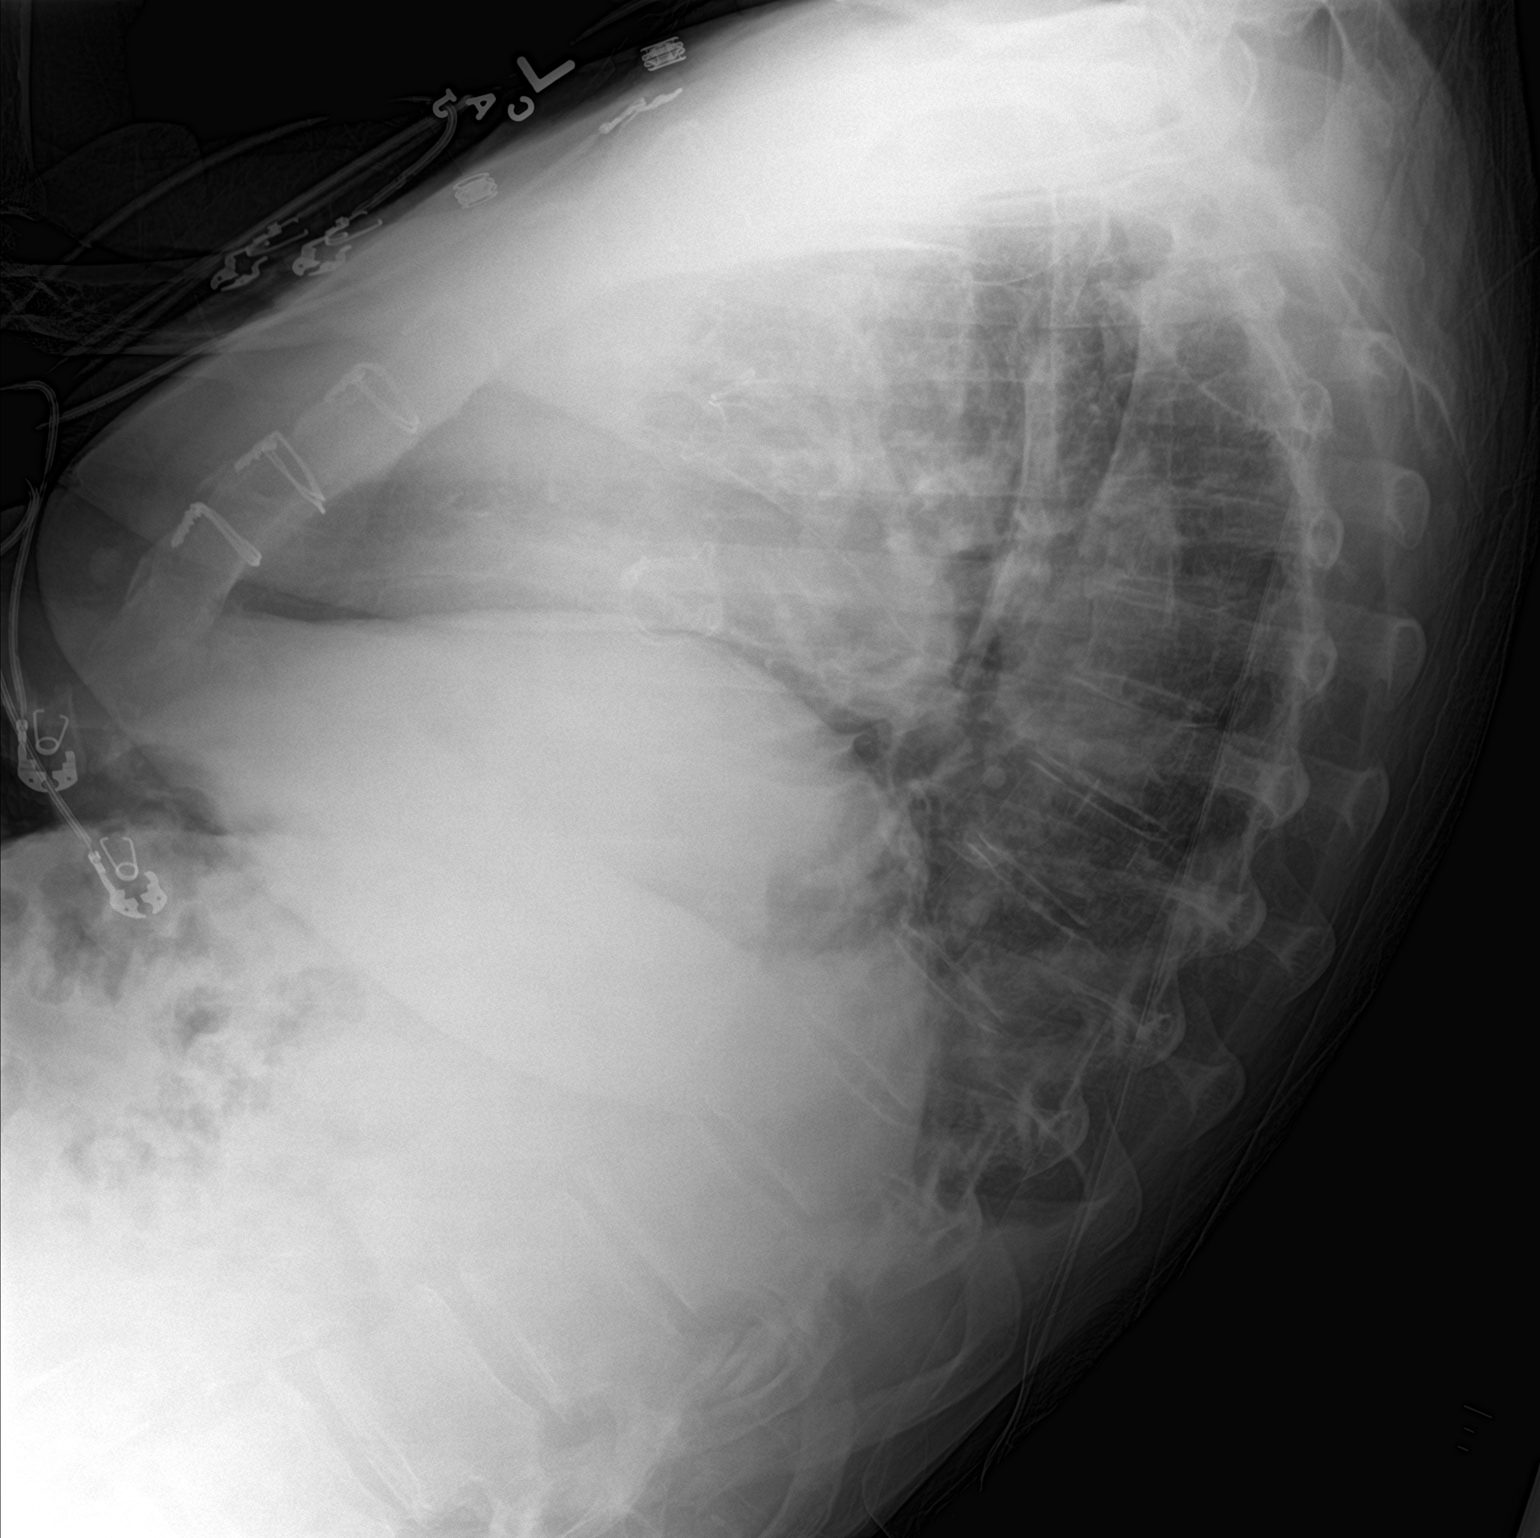

[chest ap]
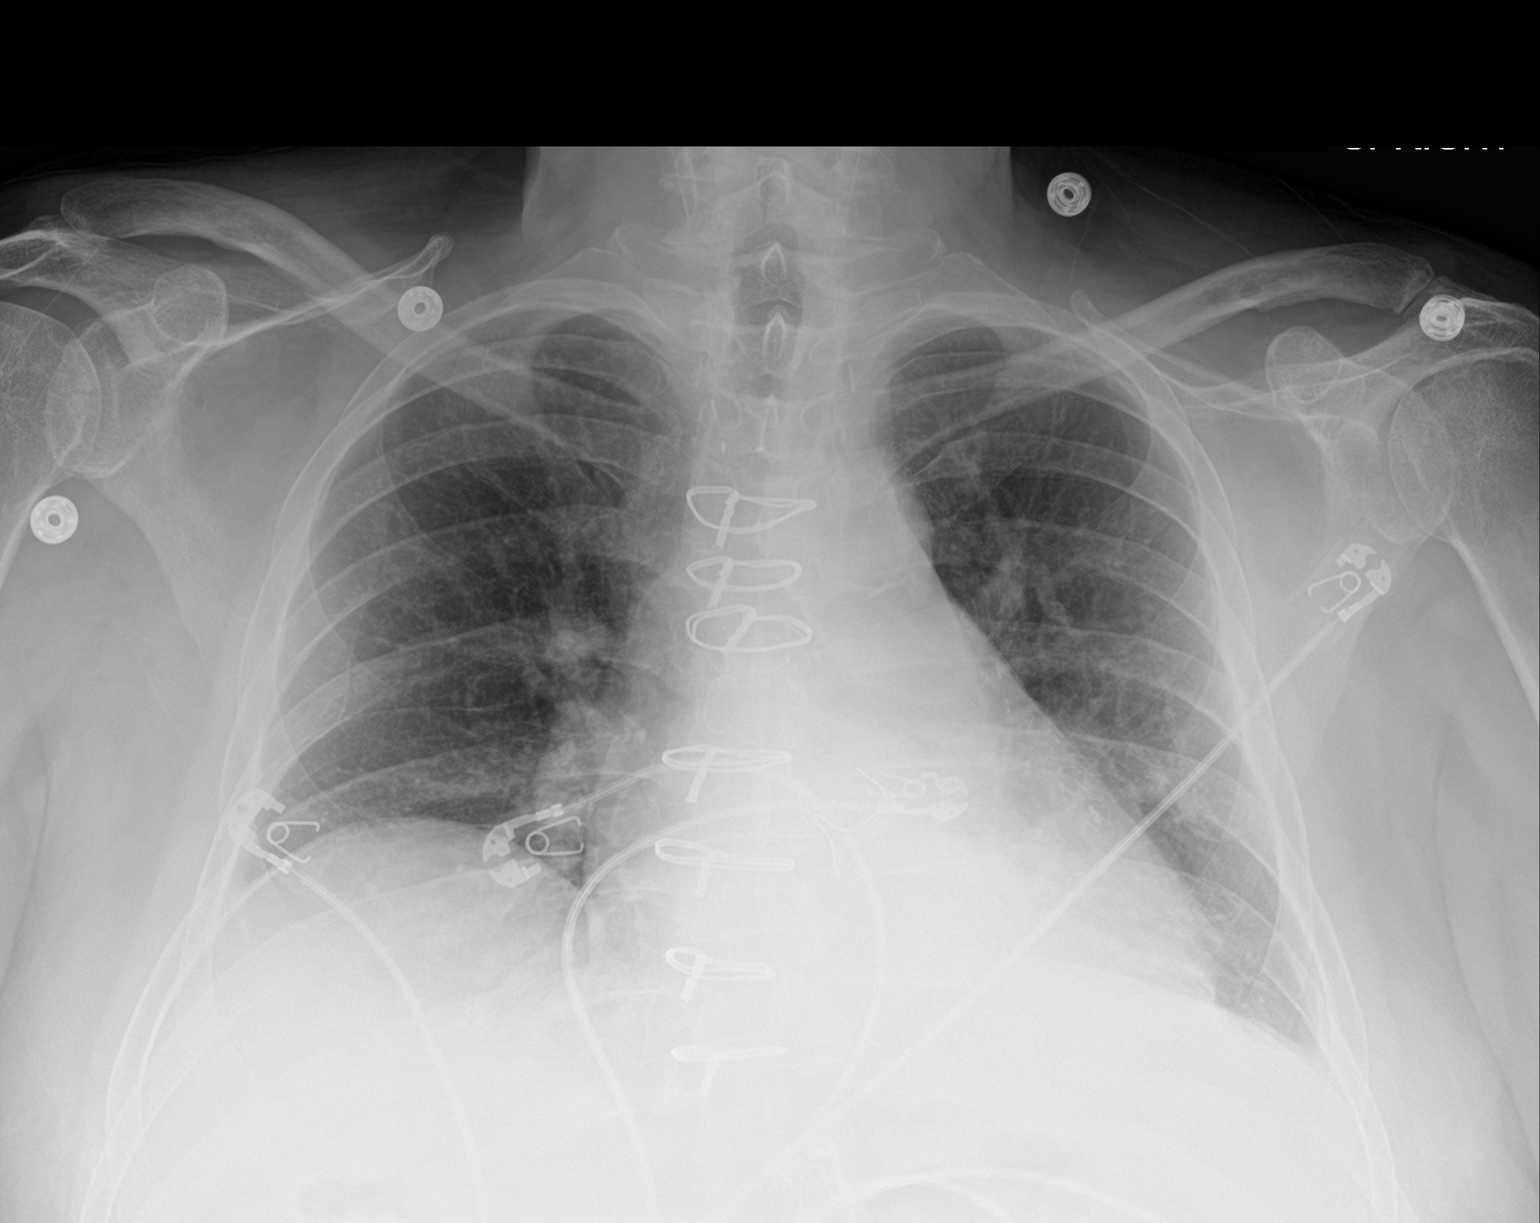

[2 of 2 positions shown; findings below may reference images not displayed]

FINDINGS: Sternotomy wires are unchanged. Lungs are hypoinflated with stable
mild left base/retrocardiac opacification which may be due to
atelectasis or infection. No significant effusion. Mild stable
cardiomegaly. Remainder of the exam is unchanged.
IMPRESSION: Stable left base/retrocardiac opacification which may be due to
atelectasis or infection.

Mild stable cardiomegaly.

## 2019-12-09 ENCOUNTER — Other Ambulatory Visit: Payer: Self-pay | Admitting: Family Medicine

## 2019-12-16 ENCOUNTER — Ambulatory Visit (INDEPENDENT_AMBULATORY_CARE_PROVIDER_SITE_OTHER): Payer: BC Managed Care – PPO | Admitting: *Deleted

## 2019-12-16 DIAGNOSIS — I639 Cerebral infarction, unspecified: Secondary | ICD-10-CM | POA: Diagnosis not present

## 2019-12-16 LAB — CUP PACEART REMOTE DEVICE CHECK
Date Time Interrogation Session: 20210404041756
Implantable Pulse Generator Implant Date: 20200128

## 2019-12-18 NOTE — Progress Notes (Signed)
ILR Remote 

## 2019-12-25 DIAGNOSIS — H2512 Age-related nuclear cataract, left eye: Secondary | ICD-10-CM | POA: Diagnosis not present

## 2019-12-26 ENCOUNTER — Other Ambulatory Visit: Payer: Self-pay | Admitting: Family Medicine

## 2019-12-26 DIAGNOSIS — H2511 Age-related nuclear cataract, right eye: Secondary | ICD-10-CM | POA: Diagnosis not present

## 2020-01-08 DIAGNOSIS — H2511 Age-related nuclear cataract, right eye: Secondary | ICD-10-CM | POA: Diagnosis not present

## 2020-01-16 LAB — CUP PACEART REMOTE DEVICE CHECK
Date Time Interrogation Session: 20210505042204
Implantable Pulse Generator Implant Date: 20200128

## 2020-01-20 ENCOUNTER — Ambulatory Visit (INDEPENDENT_AMBULATORY_CARE_PROVIDER_SITE_OTHER): Payer: BC Managed Care – PPO | Admitting: *Deleted

## 2020-01-20 DIAGNOSIS — I639 Cerebral infarction, unspecified: Secondary | ICD-10-CM | POA: Diagnosis not present

## 2020-01-20 NOTE — Progress Notes (Signed)
Carelink Summary Report / Loop Recorder 

## 2020-02-24 ENCOUNTER — Ambulatory Visit (INDEPENDENT_AMBULATORY_CARE_PROVIDER_SITE_OTHER): Payer: BC Managed Care – PPO | Admitting: *Deleted

## 2020-02-24 DIAGNOSIS — I639 Cerebral infarction, unspecified: Secondary | ICD-10-CM

## 2020-02-24 LAB — CUP PACEART REMOTE DEVICE CHECK
Date Time Interrogation Session: 20210614000538
Implantable Pulse Generator Implant Date: 20200128

## 2020-02-25 NOTE — Progress Notes (Signed)
Carelink Summary Report / Loop Recorder 

## 2020-02-27 ENCOUNTER — Ambulatory Visit (INDEPENDENT_AMBULATORY_CARE_PROVIDER_SITE_OTHER): Payer: BC Managed Care – PPO | Admitting: Family Medicine

## 2020-02-27 ENCOUNTER — Other Ambulatory Visit: Payer: Self-pay

## 2020-02-27 ENCOUNTER — Encounter: Payer: Self-pay | Admitting: Family Medicine

## 2020-02-27 VITALS — BP 128/64 | HR 53 | Temp 95.7°F | Ht 72.0 in | Wt 237.0 lb

## 2020-02-27 DIAGNOSIS — M791 Myalgia, unspecified site: Secondary | ICD-10-CM | POA: Insufficient documentation

## 2020-02-27 DIAGNOSIS — Z6832 Body mass index (BMI) 32.0-32.9, adult: Secondary | ICD-10-CM

## 2020-02-27 DIAGNOSIS — I119 Hypertensive heart disease without heart failure: Secondary | ICD-10-CM

## 2020-02-27 DIAGNOSIS — R7301 Impaired fasting glucose: Secondary | ICD-10-CM | POA: Diagnosis not present

## 2020-02-27 DIAGNOSIS — I10 Essential (primary) hypertension: Secondary | ICD-10-CM | POA: Diagnosis not present

## 2020-02-27 DIAGNOSIS — E6609 Other obesity due to excess calories: Secondary | ICD-10-CM | POA: Insufficient documentation

## 2020-02-27 DIAGNOSIS — E782 Mixed hyperlipidemia: Secondary | ICD-10-CM | POA: Diagnosis not present

## 2020-02-27 DIAGNOSIS — E669 Obesity, unspecified: Secondary | ICD-10-CM | POA: Insufficient documentation

## 2020-02-27 DIAGNOSIS — K219 Gastro-esophageal reflux disease without esophagitis: Secondary | ICD-10-CM

## 2020-02-27 MED ORDER — LISINOPRIL 40 MG PO TABS: 40.0000 mg | ORAL_TABLET | Freq: Every day | ORAL | 1 refills | Status: DC

## 2020-02-27 MED ORDER — LIVALO 1 MG PO TABS: 1.0000 mg | ORAL_TABLET | Freq: Every day | ORAL | 1 refills | Status: DC

## 2020-02-27 NOTE — Progress Notes (Signed)
Subjective:  Patient ID: Daniel Mann, male    DOB: December 10, 1950  Age: 69 y.o. MRN: [id number]  Chief Complaint  Patient presents with  . Gastroesophageal Reflux  . Hyperlipidemia  . Hypertension    HPI Hypertension was first diagnosed several years ago.  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 His current cardiac medication regimen includes a beta-blocker ( metoprolol 25 mg one bid ) and an ACE inhibitor ( Lisinopril 40 mg once daily ).  He is tolerating the medication well without side effects.  Compliance with treatment has been good; he takes his medication as directed, maintains his diet and exercise regimen, and follows up as directed.  Cardiology ordered an echo and it is pending.     [name] presents with a diagnosis of impaired fasting glucose.  The course has been stable and nonprogressive.  Eating healthy and exercising.    Pt presents with hyperlipidemia.  Current treatment includes Livalo 1 mg once daily. Pt has been out for 2 months.  Compliance with treatment has been good; he takes his medication as directed, maintains his low cholesterol diet and exercising.  [name] presents with a diagnosis of nonrheumatic aortic (valve) stenosis.  Underwent AV replacement (BOVINE), as well as CABG (06/2018). The course has been stable and nonprogressive.  There are no associated symptoms.  FOLLOWS UP WITH DR Eden Emms.      [name] presents with a diagnosis of personal history of transient ischemic attack (TIA), and cerebral infarction without residual deficits.  This was diagnosed 09/2018.  He has no sequelae.  Implanted monitor (implanted in January 2020.)     Past Medical History:  Diagnosis Date  . Aortic stenosis   . CHF (congestive heart failure) (HCC)   . GERD (gastroesophageal reflux disease)   . Heart murmur   . Hernia, inguinal, right   . Hyperlipidemia   . Hypertension   . Impaired fasting glucose   . Mixed hyperlipidemia   . Umbilical hernia   . Vitamin D deficiency    Past Surgical  History:  Procedure Laterality Date  . AORTIC VALVE REPLACEMENT N/A 07/12/2018   Procedure: AORTIC VALVE REPLACEMENT (AVR) using an Inspiris Valve size  23;  Surgeon: [name], MD;  Location: [facility] OR;  Service: Open Heart Surgery;  Laterality: N/A;  . CATARACT EXTRACTION, BILATERAL    . COLONOSCOPY    . CORONARY ARTERY BYPASS GRAFT N/A 07/12/2018   Procedure: CORONARY ARTERY BYPASS GRAFTING (CABG) times three using left internal mammary artery and right greater saphenous vein harvested endoscopically.;  Surgeon: [name], MD;  Location: [facility] OR;  Service: Open Heart Surgery;  Laterality: N/A;  . INSERTION OF MESH  10/02/2019   Procedure: INSERTION OF MESH;  Surgeon: Manus Rudd, MD;  Location: [facility];  Service: General;;  . LOOP RECORDER INSERTION N/A 10/09/2018   Procedure: LOOP RECORDER INSERTION;  Surgeon: Hillis Range, MD;  Location: [facility];  Service: Cardiovascular;  Laterality: N/A;  . RIGHT/LEFT HEART CATH AND CORONARY ANGIOGRAPHY N/A 07/02/2018   Procedure: RIGHT/LEFT HEART CATH AND CORONARY ANGIOGRAPHY;  Surgeon: Lyn Records, MD;  Location: [facility];  Service: Cardiovascular;  Laterality: N/A;  . TEE WITHOUT CARDIOVERSION N/A 07/12/2018   Procedure: TRANSESOPHAGEAL ECHOCARDIOGRAM (TEE);  Surgeon: [name], MD;  Location[facility];  Service: Open Heart Surgery;  Laterality: N/A;  . UMBILICAL HERNIA REPAIR N/A 10/02/2019   Procedure: UMBILICAL HERNIA REPAIR WITH MESH;  Surgeon: Manus Rudd, MD;  Location: [facility];  Service: General;  Laterality: N/A;    Family History  Problem Relation Age of Onset  . Alzheimer's disease Mother   . Renal Disease Father   . Cancer - Prostate Father    Social History   Socioeconomic History  . Marital status: Widowed    Spouse name: Not on file  . Number of children: Not on file  . Years of education: Not on file  . Highest education level: Not on file  Occupational  History  . Not on file  Tobacco Use  . Smoking status: Former Games developer  . Smokeless tobacco: Never Used  Vaping Use  . Vaping Use: Never used  Substance and Sexual Activity  . Alcohol use: Yes    Comment: WINE occais  . Drug use: No  . Sexual activity: Not on file  Other Topics Concern  . Not on file  Social History Narrative  . Not on file   Social Determinants of Health   Financial Resource Strain:   . Difficulty of Paying Living Expenses:   Food Insecurity:   . Worried About Programme researcher, broadcasting/film/video in the Last Year:   . Barista in the Last Year:   Transportation Needs:   . Freight forwarder (Medical):   Marland Kitchen Lack of Transportation (Non-Medical):   Physical Activity:   . Days of Exercise per Week:   . Minutes of Exercise per Session:   Stress:   . Feeling of Stress :   Social Connections:   . Frequency of Communication with Friends and Family:   . Frequency of Social Gatherings with Friends and Family:   . Attends Religious Services:   . Active Member of Clubs or Organizations:   . Attends Banker Meetings:   Marland Kitchen Marital Status:     Review of Systems  Constitutional: Negative for chills, fatigue and fever.  HENT: Negative for congestion, ear pain and sore throat.   Respiratory: Negative for cough and shortness of breath.   Cardiovascular: Negative for chest pain.  Gastrointestinal: Negative for abdominal pain, constipation, diarrhea, nausea and vomiting.  Genitourinary: Negative for dysuria and frequency.  Musculoskeletal: Positive for myalgias (In lower legs). Negative for arthralgias.  Neurological: Negative for dizziness and headaches.  Psychiatric/Behavioral: Positive for dysphoric mood. The patient is not nervous/anxious.        No dysphoria     Objective:  BP 128/64   Pulse (!) 53   Temp (!) 95.7 F (35.4 C)   Ht 6' (1.829 m)   Wt 237 lb (107.5 kg)   SpO2 99%   BMI 32.14 kg/m   BP/Weight 02/27/2020 10/02/2019 06/21/2019  Systolic  BP 128 295 140  Diastolic BP 64 76 72  Wt. (Lbs) 237 231.92 228  BMI 32.14 31.45 30.92    Physical Exam Vitals reviewed.  Constitutional:      Appearance: Normal appearance.  Neck:     Vascular: No carotid bruit.  Cardiovascular:     Rate and Rhythm: Normal rate and regular rhythm.     Pulses: Normal pulses.     Heart sounds: Normal heart sounds.  Pulmonary:     Effort: Pulmonary effort is normal.     Breath sounds: Normal breath sounds. No wheezing, rhonchi or rales.  Abdominal:     General: Bowel sounds are normal.     Palpations: Abdomen is soft.     Tenderness: There is no abdominal tenderness.  Neurological:     Mental Status:  He is alert.  Psychiatric:        Mood and Affect: Mood normal.        Behavior: Behavior normal.     Diabetic Foot Exam - Simple   Simple Foot Form Diabetic Foot exam was performed with the following findings: Yes 02/27/2020  7:35 PM  Visual Inspection No deformities, no ulcerations, no other skin breakdown bilaterally: Yes Sensation Testing Intact to touch and monofilament testing bilaterally: Yes Pulse Check Posterior Tibialis and Dorsalis pulse intact bilaterally: Yes Comments     Assessment & Plan:   1. Hypertensive heart disease without heart failure Well controlled.  No changes to medicines.  Continue to work on eating a healthy diet and exercise.  Labs drawn today.  - Comprehensive metabolic panel  2. Gastroesophageal reflux disease without esophagitis The current medical regimen is effective;  continue present plan and medications.  3. Mixed hyperlipidemia Uncontrolled.  Restart Livalo. Continue to work on eating a healthy diet and exercise.  Labs drawn today.  - Lipid panel - Pitavastatin Calcium (LIVALO) 1 MG TABS; Take 1 tablet (1 mg total) by mouth daily.  Dispense: 90 tablet; Refill: 1  4. Impaired fasting glucose Recommend continue to work on eating healthy diet and exercise. - CBC with Differential/Platelet -  Hemoglobin A1c  5. Myalgia - Magnesium - Phosphorus  6. Class 1 obesity due to excess calories with serious comorbidity and body mass index (BMI) of 32.0 to 32.9 in adult  Recommend continue to work on eating healthy diet and exercise.  Meds ordered this encounter  Medications  . Pitavastatin Calcium (LIVALO) 1 MG TABS    Sig: Take 1 tablet (1 mg total) by mouth daily.    Dispense:  90 tablet    Refill:  1  . lisinopril (ZESTRIL) 40 MG tablet    Sig: Take 1 tablet (40 mg total) by mouth daily.    Dispense:  90 tablet    Refill:  1    Orders Placed This Encounter  Procedures  . CBC with Differential/Platelet  . Lipid panel  . Comprehensive metabolic panel  . Magnesium  . Hemoglobin A1c  . Phosphorus     Follow-up: Return in about 3 months (around 05/29/2020) for fasting.  An After Visit Summary was printed and given to the patient.  Rochel Brome [facility] [phone]

## 2020-02-28 LAB — CBC WITH DIFFERENTIAL/PLATELET
Basophils Absolute: 0.1 10*3/uL (ref 0.0–0.2)
Basos: 1 %
EOS (ABSOLUTE): 0.3 10*3/uL (ref 0.0–0.4)
Eos: 4 %
Hematocrit: 44.8 % (ref 37.5–51.0)
Hemoglobin: 14.9 g/dL (ref 13.0–17.7)
Immature Grans (Abs): 0 10*3/uL (ref 0.0–0.1)
Immature Granulocytes: 0 %
Lymphocytes Absolute: 1.9 10*3/uL (ref 0.7–3.1)
Lymphs: 24 %
MCH: 31.9 pg (ref 26.6–33.0)
MCHC: 33.3 g/dL (ref 31.5–35.7)
MCV: 96 fL (ref 79–97)
Monocytes Absolute: 0.7 10*3/uL (ref 0.1–0.9)
Monocytes: 9 %
Neutrophils Absolute: 4.9 10*3/uL (ref 1.4–7.0)
Neutrophils: 62 %
Platelets: 278 10*3/uL (ref 150–450)
RBC: 4.67 x10E6/uL (ref 4.14–5.80)
RDW: 12.3 % (ref 11.6–15.4)
WBC: 7.9 10*3/uL (ref 3.4–10.8)

## 2020-02-28 LAB — PHOSPHORUS: Phosphorus: 2.7 mg/dL — ABNORMAL LOW (ref 2.8–4.1)

## 2020-02-28 LAB — COMPREHENSIVE METABOLIC PANEL
ALT: 28 IU/L (ref 0–44)
AST: 24 IU/L (ref 0–40)
Albumin/Globulin Ratio: 1.6 (ref 1.2–2.2)
Albumin: 4.3 g/dL (ref 3.8–4.8)
Alkaline Phosphatase: 91 IU/L (ref 48–121)
BUN/Creatinine Ratio: 23 (ref 10–24)
BUN: 22 mg/dL (ref 8–27)
Bilirubin Total: 0.4 mg/dL (ref 0.0–1.2)
CO2: 24 mmol/L (ref 20–29)
Calcium: 10.4 mg/dL — ABNORMAL HIGH (ref 8.6–10.2)
Chloride: 103 mmol/L (ref 96–106)
Creatinine, Ser: 0.96 mg/dL (ref 0.76–1.27)
GFR calc Af Amer: 93 mL/min/{1.73_m2} (ref >59)
GFR calc non Af Amer: 80 mL/min/{1.73_m2} (ref >59)
Globulin, Total: 2.7 g/dL (ref 1.5–4.5)
Glucose: 108 mg/dL — ABNORMAL HIGH (ref 65–99)
Potassium: 5.5 mmol/L — ABNORMAL HIGH (ref 3.5–5.2)
Sodium: 138 mmol/L (ref 134–144)
Total Protein: 7 g/dL (ref 6.0–8.5)

## 2020-02-28 LAB — LIPID PANEL
Chol/HDL Ratio: 4.9 ratio (ref 0.0–5.0)
Cholesterol, Total: 242 mg/dL — ABNORMAL HIGH (ref 100–199)
HDL: 49 mg/dL (ref >39)
LDL Chol Calc (NIH): 169 mg/dL — ABNORMAL HIGH (ref 0–99)
Triglycerides: 134 mg/dL (ref 0–149)
VLDL Cholesterol Cal: 24 mg/dL (ref 5–40)

## 2020-02-28 LAB — HEMOGLOBIN A1C
Est. average glucose Bld gHb Est-mCnc: 120 mg/dL
Hgb A1c MFr Bld: 5.8 % — ABNORMAL HIGH (ref 4.8–5.6)

## 2020-02-28 LAB — MAGNESIUM: Magnesium: 2.1 mg/dL (ref 1.6–2.3)

## 2020-02-28 LAB — CARDIOVASCULAR RISK ASSESSMENT

## 2020-03-02 ENCOUNTER — Other Ambulatory Visit: Payer: Self-pay

## 2020-03-02 MED ORDER — ESOMEPRAZOLE MAGNESIUM 40 MG PO CPDR: 40.0000 mg | DELAYED_RELEASE_CAPSULE | Freq: Every day | ORAL | 1 refills | Status: DC

## 2020-03-02 MED ORDER — EVOLOCUMAB 140 MG/ML ~~LOC~~ SOAJ: 140.0000 mg | SUBCUTANEOUS | 3 refills | Status: DC

## 2020-03-02 MED ORDER — METOPROLOL TARTRATE 25 MG PO TABS: 25.0000 mg | ORAL_TABLET | Freq: Two times a day (BID) | ORAL | 1 refills | Status: DC

## 2020-03-02 NOTE — Telephone Encounter (Signed)
Patient is agreed to try repatha and he needs refills.

## 2020-03-18 ENCOUNTER — Telehealth: Payer: Self-pay

## 2020-03-18 NOTE — Telephone Encounter (Signed)
Repatha denied by [facility]. Case number : [id number]

## 2020-03-23 ENCOUNTER — Telehealth: Payer: Self-pay

## 2020-03-23 NOTE — Telephone Encounter (Signed)
PA for Repatha submitted and denied via covermymeds. More info to be faxed by insurance.

## 2020-03-25 ENCOUNTER — Encounter: Payer: Self-pay | Admitting: Family Medicine

## 2020-03-25 DIAGNOSIS — Z1211 Encounter for screening for malignant neoplasm of colon: Secondary | ICD-10-CM | POA: Diagnosis not present

## 2020-03-25 DIAGNOSIS — K621 Rectal polyp: Secondary | ICD-10-CM | POA: Diagnosis not present

## 2020-03-27 ENCOUNTER — Telehealth: Payer: Self-pay

## 2020-03-27 NOTE — Telephone Encounter (Signed)
Daniel Mann called after receiving notification of denial of Repatha from his insurance.  He currently is not taking any medication for his cholesterol and he is concerned about what he should do now.

## 2020-03-27 NOTE — Telephone Encounter (Signed)
Were we trying to get him approved or cardiology? I would consider calling his cardiologist if we were the ones trying and see if they have more success.  I would put him back on a statin. Livalo 1 mg once daily.  Also he may benefit from seeing Les Pou, PHD if he has not already and if his insurance will allow it.  Dr. Sedalia Muta

## 2020-03-30 ENCOUNTER — Ambulatory Visit (INDEPENDENT_AMBULATORY_CARE_PROVIDER_SITE_OTHER): Payer: BC Managed Care – PPO | Admitting: *Deleted

## 2020-03-30 DIAGNOSIS — I639 Cerebral infarction, unspecified: Secondary | ICD-10-CM | POA: Diagnosis not present

## 2020-03-30 LAB — CUP PACEART REMOTE DEVICE CHECK
Date Time Interrogation Session: 20210718232931
Implantable Pulse Generator Implant Date: 20200128

## 2020-03-31 NOTE — Progress Notes (Signed)
Carelink Summary Report / Loop Recorder 

## 2020-04-12 HISTORY — PX: COLON SURGERY: SHX602

## 2020-04-14 DIAGNOSIS — L578 Other skin changes due to chronic exposure to nonionizing radiation: Secondary | ICD-10-CM | POA: Diagnosis not present

## 2020-04-14 DIAGNOSIS — L570 Actinic keratosis: Secondary | ICD-10-CM | POA: Diagnosis not present

## 2020-04-14 DIAGNOSIS — D485 Neoplasm of uncertain behavior of skin: Secondary | ICD-10-CM | POA: Diagnosis not present

## 2020-04-14 DIAGNOSIS — L821 Other seborrheic keratosis: Secondary | ICD-10-CM | POA: Diagnosis not present

## 2020-04-15 ENCOUNTER — Other Ambulatory Visit: Payer: Self-pay

## 2020-04-15 ENCOUNTER — Encounter: Payer: Self-pay | Admitting: Family Medicine

## 2020-04-15 ENCOUNTER — Ambulatory Visit (INDEPENDENT_AMBULATORY_CARE_PROVIDER_SITE_OTHER): Payer: BC Managed Care – PPO | Admitting: Family Medicine

## 2020-04-15 VITALS — BP 122/70 | HR 80 | Temp 94.9°F | Ht 72.0 in | Wt 233.0 lb

## 2020-04-15 DIAGNOSIS — R52 Pain, unspecified: Secondary | ICD-10-CM | POA: Diagnosis not present

## 2020-04-15 DIAGNOSIS — K55039 Acute (reversible) ischemia of large intestine, extent unspecified: Secondary | ICD-10-CM | POA: Diagnosis not present

## 2020-04-15 DIAGNOSIS — Z87891 Personal history of nicotine dependence: Secondary | ICD-10-CM | POA: Diagnosis not present

## 2020-04-15 DIAGNOSIS — Z951 Presence of aortocoronary bypass graft: Secondary | ICD-10-CM | POA: Diagnosis not present

## 2020-04-15 DIAGNOSIS — R1031 Right lower quadrant pain: Secondary | ICD-10-CM | POA: Diagnosis not present

## 2020-04-15 DIAGNOSIS — R0902 Hypoxemia: Secondary | ICD-10-CM | POA: Diagnosis not present

## 2020-04-15 DIAGNOSIS — K559 Vascular disorder of intestine, unspecified: Secondary | ICD-10-CM | POA: Diagnosis not present

## 2020-04-15 DIAGNOSIS — R1011 Right upper quadrant pain: Secondary | ICD-10-CM | POA: Diagnosis not present

## 2020-04-15 DIAGNOSIS — J9811 Atelectasis: Secondary | ICD-10-CM | POA: Diagnosis not present

## 2020-04-15 DIAGNOSIS — R1084 Generalized abdominal pain: Secondary | ICD-10-CM | POA: Diagnosis not present

## 2020-04-15 DIAGNOSIS — N281 Cyst of kidney, acquired: Secondary | ICD-10-CM | POA: Diagnosis not present

## 2020-04-15 DIAGNOSIS — Z954 Presence of other heart-valve replacement: Secondary | ICD-10-CM | POA: Diagnosis not present

## 2020-04-15 DIAGNOSIS — R140 Abdominal distension (gaseous): Secondary | ICD-10-CM | POA: Diagnosis not present

## 2020-04-15 DIAGNOSIS — R109 Unspecified abdominal pain: Secondary | ICD-10-CM | POA: Diagnosis not present

## 2020-04-15 DIAGNOSIS — I2510 Atherosclerotic heart disease of native coronary artery without angina pectoris: Secondary | ICD-10-CM

## 2020-04-15 DIAGNOSIS — K55031 Focal (segmental) acute (reversible) ischemia of large intestine: Secondary | ICD-10-CM | POA: Diagnosis not present

## 2020-04-15 DIAGNOSIS — R0602 Shortness of breath: Secondary | ICD-10-CM | POA: Diagnosis not present

## 2020-04-15 DIAGNOSIS — I517 Cardiomegaly: Secondary | ICD-10-CM | POA: Diagnosis not present

## 2020-04-15 DIAGNOSIS — Z8673 Personal history of transient ischemic attack (TIA), and cerebral infarction without residual deficits: Secondary | ICD-10-CM | POA: Diagnosis not present

## 2020-04-15 DIAGNOSIS — I10 Essential (primary) hypertension: Secondary | ICD-10-CM | POA: Diagnosis not present

## 2020-04-15 DIAGNOSIS — K219 Gastro-esophageal reflux disease without esophagitis: Secondary | ICD-10-CM | POA: Diagnosis not present

## 2020-04-15 NOTE — Progress Notes (Signed)
Acute Office Visit  Subjective:    Patient ID: Daniel Mann, male    DOB: 08/09/1951, 69 y.o.   MRN: [id number]  Chief Complaint  Patient presents with  . Abdominal Pain    RLQ    HPI Patient is in today for RLQ and RUQ abdominal pain that started today around 1030 this a.m. He has had some nausea and states it is somewhat tender to the touch. Has had some difficulty urinating since pain started but is unable to go at this time. Last BM was this morning. Never happened previously.   Denies chest pain. It has progressively worsened as the day has gone on.  Pain he feels he is little constipated.  Denies diarrhea.  Denies fever.  Hurt so bad that he has broken out in a sweat and feels short of breath.  He does have a significant history of cardiovascular disease status post bypass and aortic valve replacement in 2019.Marland Kitchen  Past Medical History:  Diagnosis Date  . Acute encephalopathy 07/20/2018  . Aortic stenosis   . CAD (coronary artery disease)   . CHF (congestive heart failure) (HCC)   . GERD (gastroesophageal reflux disease)   . Heart murmur   . Hernia, inguinal, right   . Hyperlipidemia   . Hypertension   . Impaired fasting glucose   . Mixed hyperlipidemia   . Umbilical hernia   . Vitamin D deficiency     Past Surgical History:  Procedure Laterality Date  . AORTIC VALVE REPLACEMENT N/A 07/12/2018   Procedure: AORTIC VALVE REPLACEMENT (AVR) using an Inspiris Valve size  23;  Surgeon: [name], MD;  Location: [facility];  Service: Open Heart Surgery;  Laterality: N/A;  . CATARACT EXTRACTION, BILATERAL    . COLONOSCOPY    . CORONARY ARTERY BYPASS GRAFT N/A 07/12/2018   Procedure: CORONARY ARTERY BYPASS GRAFTING (CABG) times three using left internal mammary artery and right greater saphenous vein harvested endoscopically.;  Surgeon: [name], MD;  Location: [facility] OR;  Service: Open Heart Surgery;  Laterality: N/A;  . INSERTION OF MESH  10/02/2019   Procedure: INSERTION OF  MESH;  Surgeon: Manus Rudd, MD;  Location: [facility];  Service: General;;  . LOOP RECORDER INSERTION N/A 10/09/2018   Procedure: LOOP RECORDER INSERTION;  Surgeon: Hillis Range, MD;  [facility]: [facility];  Service: Cardiovascular;  Laterality: N/A;  . RIGHT/LEFT HEART CATH AND CORONARY ANGIOGRAPHY N/A 07/02/2018   Procedure: RIGHT/LEFT HEART CATH AND CORONARY ANGIOGRAPHY;  Surgeon: Lyn Records, MD;  [facility];  Service: Cardiovascular;  Laterality: N/A;  . TEE WITHOUT CARDIOVERSION N/A 07/12/2018   Procedure: TRANSESOPHAGEAL ECHOCARDIOGRAM (TEE);  Surgeon: [name], MD;  Location: [facility] OR;  Service: Open Heart Surgery;  Laterality: N/A;  . UMBILICAL HERNIA REPAIR N/A 10/02/2019   Procedure: UMBILICAL HERNIA REPAIR WITH MESH;  Surgeon: Manus Rudd, MD;  Location: [facility];  Service: General;  Laterality: N/A;    Family History  Problem Relation Age of Onset  . Alzheimer's disease Mother   . Renal Disease Father   . Cancer - Prostate Father     Social History   Socioeconomic History  . Marital status: Widowed    Spouse name: Not on file  . Number of children: Not on file  . Years of education: Not on file  . Highest education level: Not on file  Occupational History  . Not on file  Tobacco Use  .  Smoking status: Former Games developer  . Smokeless tobacco: Never Used  Vaping Use  . Vaping Use: Never used  Substance and Sexual Activity  . Alcohol use: Yes    Comment: WINE occais  . Drug use: No  . Sexual activity: Not on file  Other Topics Concern  . Not on file  Social History Narrative  . Not on file   Social Determinants of Health   Financial Resource Strain:   . Difficulty of Paying Living Expenses:   Food Insecurity:   . Worried About Programme researcher, broadcasting/film/video in the Last Year:   . Barista in the Last Year:   Transportation Needs:   . Freight forwarder (Medical):   Marland Kitchen Lack of Transportation  (Non-Medical):   Physical Activity:   . Days of Exercise per Week:   . Minutes of Exercise per Session:   Stress:   . Feeling of Stress :   Social Connections:   . Frequency of Communication with Friends and Family:   . Frequency of Social Gatherings with Friends and Family:   . Attends Religious Services:   . Active Member of Clubs or Organizations:   . Attends Banker Meetings:   Marland Kitchen Marital Status:   Intimate Partner Violence:   . Fear of Current or Ex-Partner:   . Emotionally Abused:   Marland Kitchen Physically Abused:   . Sexually Abused:     Outpatient Medications Prior to Visit  Medication Sig Dispense Refill  . acetaminophen (TYLENOL) 325 MG tablet Take 2 tablets (650 mg total) by mouth every 6 (six) hours as needed for mild pain.    Marland Kitchen aspirin EC 325 MG EC tablet Take 1 tablet (325 mg total) by mouth daily. 30 tablet 0  . ergocalciferol (VITAMIN D2) 1.25 MG (50000 UT) capsule Take 1 capsule (50,000 Units total) by mouth once a week. Saturdays & Wednesdays.    Marland Kitchen esomeprazole (NEXIUM) 40 MG capsule Take 1 capsule (40 mg total) by mouth daily. 90 capsule 1  . Evolocumab 140 MG/ML SOAJ Inject 140 mg into the skin every 14 (fourteen) days. 2 mL 3  . lisinopril (ZESTRIL) 40 MG tablet Take 1 tablet (40 mg total) by mouth daily. 90 tablet 1  . metoprolol tartrate (LOPRESSOR) 25 MG tablet Take 1 tablet (25 mg total) by mouth 2 (two) times daily. 180 tablet 1  . Multiple Vitamin (MULTIVITAMIN) tablet Take 1 tablet by mouth daily.    Marland Kitchen oxyCODONE (OXY IR/ROXICODONE) 5 MG immediate release tablet Take 1 tablet (5 mg total) by mouth every 6 (six) hours as needed for severe pain. 15 tablet 0  . Pitavastatin Calcium (LIVALO) 1 MG TABS Take 1 tablet (1 mg total) by mouth daily. 90 tablet 1   No facility-administered medications prior to visit.    No Known Allergies  Review of Systems  Constitutional: Negative for chills, diaphoresis, fatigue and fever.  HENT: Negative for congestion, ear  pain and sore throat.   Respiratory: Negative for cough and shortness of breath (with pain).   Cardiovascular: Negative for chest pain and leg swelling.  Gastrointestinal: Positive for abdominal pain (RLQ) and nausea. Negative for constipation, diarrhea and vomiting.  Genitourinary: Negative for dysuria and urgency.  Musculoskeletal: Negative for arthralgias and myalgias.  Neurological: Negative for dizziness and headaches.  Psychiatric/Behavioral: Negative for dysphoric mood.       Objective:    Physical Exam Vitals reviewed.  Constitutional:      General: He is in acute  distress.     Appearance: He is well-developed. He is diaphoretic.  Cardiovascular:     Rate and Rhythm: Normal rate and regular rhythm.     Comments: Aortic valve replacement: click. Pulmonary:     Effort: Pulmonary effort is normal. No respiratory distress.     Breath sounds: Normal breath sounds.  Abdominal:     General: Bowel sounds are decreased.     Tenderness: There is abdominal tenderness in the right upper quadrant and right lower quadrant. There is no right CVA tenderness, left CVA tenderness, guarding or rebound. Positive signs include Murphy's sign. Negative signs include McBurney's sign.  Neurological:     Mental Status: He is alert and oriented to person, place, and time.  Psychiatric:        Mood and Affect: Mood normal.        Behavior: Behavior normal.     BP 122/70   Pulse 80   Temp (!) 94.9 F (34.9 C)   Ht 6' (1.829 m)   Wt 233 lb (105.7 kg)   SpO2 93%   BMI 31.60 kg/m  Wt Readings from Last 3 Encounters:  04/15/20 233 lb (105.7 kg)  02/27/20 237 lb (107.5 kg)  10/02/19 231 lb 14.8 oz (105.2 kg)    Health Maintenance Due  Topic Date Due  . Hepatitis C Screening  Never done  . TETANUS/TDAP  Never done  . COLONOSCOPY  Never done  . PNA vac Low Risk Adult (1 of 2 - PCV13) Never done  . INFLUENZA VACCINE  04/12/2020    There are no preventive care reminders to display for  this patient.   Lab Results  Component Value Date   TSH 2.017 07/21/2018   Lab Results  Component Value Date   WBC 7.9 02/27/2020   HGB 14.9 02/27/2020   HCT 44.8 02/27/2020   MCV 96 02/27/2020   PLT 278 02/27/2020   Lab Results  Component Value Date   NA 138 02/27/2020   K 5.5 (H) 02/27/2020   CO2 24 02/27/2020   GLUCOSE 108 (H) 02/27/2020   BUN 22 02/27/2020   CREATININE 0.96 02/27/2020   BILITOT 0.4 02/27/2020   ALKPHOS 91 02/27/2020   AST 24 02/27/2020   ALT 28 02/27/2020   PROT 7.0 02/27/2020   ALBUMIN 4.3 02/27/2020   CALCIUM 10.4 (H) 02/27/2020   ANIONGAP 3 (L) 07/22/2018   Lab Results  Component Value Date   CHOL 242 (H) 02/27/2020   Lab Results  Component Value Date   HDL 49 02/27/2020   Lab Results  Component Value Date   LDLCALC 169 (H) 02/27/2020   Lab Results  Component Value Date   TRIG 134 02/27/2020   Lab Results  Component Value Date   CHOLHDL 4.9 02/27/2020   Lab Results  Component Value Date   HGBA1C 5.8 (H) 02/27/2020       Assessment & Plan:  1. Continuous RUQ abdominal pain 2. Continuous RLQ abdominal pain 3. Coronary artery disease involving native coronary artery of native heart without angina pectoris   Called EMS. DDX includes the most likely being appendicitis, cholecystitis. Other possibilities include abdominal aneurysm (no previous abdominal imaging to compare), diverticulitis.     Follow-up: No follow-ups on file.  An After Visit Summary was printed and given to the patient.  Blane Ohara [facility] [phone]

## 2020-04-16 DIAGNOSIS — R109 Unspecified abdominal pain: Secondary | ICD-10-CM | POA: Diagnosis not present

## 2020-04-20 ENCOUNTER — Telehealth: Payer: Self-pay

## 2020-04-20 ENCOUNTER — Other Ambulatory Visit: Payer: Self-pay

## 2020-04-20 DIAGNOSIS — E782 Mixed hyperlipidemia: Secondary | ICD-10-CM

## 2020-04-20 MED ORDER — LIVALO 1 MG PO TABS: 1.0000 mg | ORAL_TABLET | Freq: Every day | ORAL | 1 refills | Status: DC

## 2020-04-20 NOTE — Telephone Encounter (Signed)
Pharmacy sent over documentation stating that Daniel Mann is not covered by insurance

## 2020-04-20 NOTE — Telephone Encounter (Signed)
Patient is willing to try the livalo 1 mg.  He requested RX be sent to [facility] in [place].

## 2020-04-23 ENCOUNTER — Other Ambulatory Visit: Payer: Self-pay | Admitting: Podiatry

## 2020-04-23 ENCOUNTER — Ambulatory Visit (INDEPENDENT_AMBULATORY_CARE_PROVIDER_SITE_OTHER): Payer: Medicare Other | Admitting: Podiatry

## 2020-04-23 DIAGNOSIS — Z5329 Procedure and treatment not carried out because of patient's decision for other reasons: Secondary | ICD-10-CM

## 2020-04-23 DIAGNOSIS — M79671 Pain in right foot: Secondary | ICD-10-CM

## 2020-04-23 NOTE — Progress Notes (Signed)
No show for appt. 

## 2020-04-27 DIAGNOSIS — I700 Atherosclerosis of aorta: Secondary | ICD-10-CM | POA: Diagnosis not present

## 2020-04-27 DIAGNOSIS — J984 Other disorders of lung: Secondary | ICD-10-CM | POA: Diagnosis not present

## 2020-04-27 DIAGNOSIS — K55039 Acute (reversible) ischemia of large intestine, extent unspecified: Secondary | ICD-10-CM | POA: Diagnosis not present

## 2020-04-27 DIAGNOSIS — R197 Diarrhea, unspecified: Secondary | ICD-10-CM | POA: Diagnosis not present

## 2020-04-27 DIAGNOSIS — K9189 Other postprocedural complications and disorders of digestive system: Secondary | ICD-10-CM | POA: Diagnosis not present

## 2020-04-27 DIAGNOSIS — R140 Abdominal distension (gaseous): Secondary | ICD-10-CM | POA: Diagnosis not present

## 2020-04-27 DIAGNOSIS — K560 Paralytic ileus: Secondary | ICD-10-CM | POA: Diagnosis not present

## 2020-04-29 DIAGNOSIS — Z8719 Personal history of other diseases of the digestive system: Secondary | ICD-10-CM | POA: Insufficient documentation

## 2020-04-29 HISTORY — DX: Personal history of other diseases of the digestive system: Z87.19

## 2020-04-30 ENCOUNTER — Other Ambulatory Visit (HOSPITAL_COMMUNITY): Payer: Self-pay | Admitting: Surgery

## 2020-04-30 ENCOUNTER — Inpatient Hospital Stay: Payer: BC Managed Care – PPO | Admitting: Family Medicine

## 2020-04-30 ENCOUNTER — Other Ambulatory Visit (HOSPITAL_COMMUNITY): Payer: Self-pay

## 2020-04-30 DIAGNOSIS — L0291 Cutaneous abscess, unspecified: Secondary | ICD-10-CM

## 2020-04-30 DIAGNOSIS — L02211 Cutaneous abscess of abdominal wall: Secondary | ICD-10-CM | POA: Diagnosis not present

## 2020-04-30 DIAGNOSIS — Z01818 Encounter for other preprocedural examination: Secondary | ICD-10-CM | POA: Diagnosis not present

## 2020-04-30 DIAGNOSIS — K6389 Other specified diseases of intestine: Secondary | ICD-10-CM | POA: Diagnosis not present

## 2020-04-30 DIAGNOSIS — K651 Peritoneal abscess: Secondary | ICD-10-CM

## 2020-04-30 DIAGNOSIS — R1031 Right lower quadrant pain: Secondary | ICD-10-CM | POA: Diagnosis not present

## 2020-04-30 DIAGNOSIS — K6811 Postprocedural retroperitoneal abscess: Secondary | ICD-10-CM | POA: Diagnosis not present

## 2020-04-30 DIAGNOSIS — R1011 Right upper quadrant pain: Secondary | ICD-10-CM | POA: Diagnosis not present

## 2020-04-30 HISTORY — DX: Peritoneal abscess: K65.1

## 2020-05-01 ENCOUNTER — Encounter (HOSPITAL_COMMUNITY): Payer: Self-pay | Admitting: Radiology

## 2020-05-01 NOTE — Progress Notes (Signed)
Daniel Mann Male, 69 y.o., 1951/04/14 MRN:  [id number] Phone:  [phone] Judie Petit) PCP:  Blane Ohara, MD Primary Cvg:  Medicare/Medicare Part A Next Appt With Radiology ([facility]-CT 3) 05/08/2020 at 11:00 AM  RE: CT Guided Drainage of Abdominal Abscess Received: Today Oley Balm, MD  Servando Salina Ok   CT drain dominant pelvic collection  Looks like LLQ window  Send for GS, culture sensitivity  Confirmed on phone with Daniel Mann  Can be done at [facility] or [facility] whatever 1st avail    [facility]       Previous Messages   ----- Message -----  From: Henry Russel D  Sent: 04/30/2020  6:19 PM EDT  To: Ir Procedure Requests  Subject: CT Guided Drainage of Abdominal Abscess      Procedure: CT IMAGE GUIDED DRAINAGE BY PERCUTANEOUS CATHETER   Reason: CT Guided Drain of Absces, post op abd infection   History: no previous imaging   Provider: Raechel Ache   Provider Contact: [phone]

## 2020-05-02 DIAGNOSIS — Z20828 Contact with and (suspected) exposure to other viral communicable diseases: Secondary | ICD-10-CM | POA: Diagnosis not present

## 2020-05-02 DIAGNOSIS — J3489 Other specified disorders of nose and nasal sinuses: Secondary | ICD-10-CM | POA: Diagnosis not present

## 2020-05-02 LAB — CUP PACEART REMOTE DEVICE CHECK
Date Time Interrogation Session: 20210819002025
Implantable Pulse Generator Implant Date: 20200128

## 2020-05-04 ENCOUNTER — Ambulatory Visit (INDEPENDENT_AMBULATORY_CARE_PROVIDER_SITE_OTHER): Payer: BC Managed Care – PPO | Admitting: *Deleted

## 2020-05-04 DIAGNOSIS — I639 Cerebral infarction, unspecified: Secondary | ICD-10-CM

## 2020-05-05 DIAGNOSIS — K55039 Acute (reversible) ischemia of large intestine, extent unspecified: Secondary | ICD-10-CM | POA: Diagnosis not present

## 2020-05-05 DIAGNOSIS — T8149XA Infection following a procedure, other surgical site, initial encounter: Secondary | ICD-10-CM | POA: Diagnosis not present

## 2020-05-05 DIAGNOSIS — K651 Peritoneal abscess: Secondary | ICD-10-CM | POA: Diagnosis not present

## 2020-05-06 ENCOUNTER — Inpatient Hospital Stay: Payer: BC Managed Care – PPO | Admitting: Family Medicine

## 2020-05-06 DIAGNOSIS — T8142XD Infection following a procedure, deep incisional surgical site, subsequent encounter: Secondary | ICD-10-CM | POA: Diagnosis not present

## 2020-05-06 DIAGNOSIS — F329 Major depressive disorder, single episode, unspecified: Secondary | ICD-10-CM | POA: Diagnosis not present

## 2020-05-06 DIAGNOSIS — K651 Peritoneal abscess: Secondary | ICD-10-CM | POA: Diagnosis not present

## 2020-05-06 DIAGNOSIS — I2510 Atherosclerotic heart disease of native coronary artery without angina pectoris: Secondary | ICD-10-CM | POA: Diagnosis not present

## 2020-05-06 DIAGNOSIS — Z87891 Personal history of nicotine dependence: Secondary | ICD-10-CM | POA: Diagnosis not present

## 2020-05-06 DIAGNOSIS — Z9049 Acquired absence of other specified parts of digestive tract: Secondary | ICD-10-CM | POA: Diagnosis not present

## 2020-05-06 DIAGNOSIS — I10 Essential (primary) hypertension: Secondary | ICD-10-CM | POA: Diagnosis not present

## 2020-05-07 DIAGNOSIS — I2510 Atherosclerotic heart disease of native coronary artery without angina pectoris: Secondary | ICD-10-CM | POA: Diagnosis not present

## 2020-05-07 DIAGNOSIS — Z9049 Acquired absence of other specified parts of digestive tract: Secondary | ICD-10-CM | POA: Diagnosis not present

## 2020-05-07 DIAGNOSIS — F329 Major depressive disorder, single episode, unspecified: Secondary | ICD-10-CM | POA: Diagnosis not present

## 2020-05-07 DIAGNOSIS — I10 Essential (primary) hypertension: Secondary | ICD-10-CM | POA: Diagnosis not present

## 2020-05-07 DIAGNOSIS — T8142XD Infection following a procedure, deep incisional surgical site, subsequent encounter: Secondary | ICD-10-CM | POA: Diagnosis not present

## 2020-05-07 DIAGNOSIS — K651 Peritoneal abscess: Secondary | ICD-10-CM | POA: Diagnosis not present

## 2020-05-07 DIAGNOSIS — Z87891 Personal history of nicotine dependence: Secondary | ICD-10-CM | POA: Diagnosis not present

## 2020-05-08 ENCOUNTER — Encounter (HOSPITAL_COMMUNITY): Payer: Self-pay

## 2020-05-08 ENCOUNTER — Ambulatory Visit (HOSPITAL_COMMUNITY): Payer: BC Managed Care – PPO

## 2020-05-11 DIAGNOSIS — Z87891 Personal history of nicotine dependence: Secondary | ICD-10-CM | POA: Diagnosis not present

## 2020-05-11 DIAGNOSIS — I10 Essential (primary) hypertension: Secondary | ICD-10-CM | POA: Diagnosis not present

## 2020-05-11 DIAGNOSIS — Z9049 Acquired absence of other specified parts of digestive tract: Secondary | ICD-10-CM | POA: Diagnosis not present

## 2020-05-11 DIAGNOSIS — T8142XD Infection following a procedure, deep incisional surgical site, subsequent encounter: Secondary | ICD-10-CM | POA: Diagnosis not present

## 2020-05-11 DIAGNOSIS — K651 Peritoneal abscess: Secondary | ICD-10-CM | POA: Diagnosis not present

## 2020-05-11 DIAGNOSIS — F329 Major depressive disorder, single episode, unspecified: Secondary | ICD-10-CM | POA: Diagnosis not present

## 2020-05-11 DIAGNOSIS — I2510 Atherosclerotic heart disease of native coronary artery without angina pectoris: Secondary | ICD-10-CM | POA: Diagnosis not present

## 2020-05-11 NOTE — Progress Notes (Signed)
Carelink Summary Report / Loop Recorder 

## 2020-05-12 ENCOUNTER — Telehealth: Payer: Self-pay

## 2020-05-12 DIAGNOSIS — K651 Peritoneal abscess: Secondary | ICD-10-CM | POA: Diagnosis not present

## 2020-05-12 DIAGNOSIS — I2510 Atherosclerotic heart disease of native coronary artery without angina pectoris: Secondary | ICD-10-CM | POA: Diagnosis not present

## 2020-05-12 DIAGNOSIS — Z87891 Personal history of nicotine dependence: Secondary | ICD-10-CM | POA: Diagnosis not present

## 2020-05-12 DIAGNOSIS — T8142XD Infection following a procedure, deep incisional surgical site, subsequent encounter: Secondary | ICD-10-CM | POA: Diagnosis not present

## 2020-05-12 DIAGNOSIS — F329 Major depressive disorder, single episode, unspecified: Secondary | ICD-10-CM | POA: Diagnosis not present

## 2020-05-12 DIAGNOSIS — I10 Essential (primary) hypertension: Secondary | ICD-10-CM | POA: Diagnosis not present

## 2020-05-12 DIAGNOSIS — Z9049 Acquired absence of other specified parts of digestive tract: Secondary | ICD-10-CM | POA: Diagnosis not present

## 2020-05-12 NOTE — Telephone Encounter (Signed)
Daniel Mann called to report that he has dropped 30 pounds since his colon resection.  His bp is 100/60, pulse 74 today.  Dr. Sedalia Muta advised that he decrease his lisinopril 40 mg to 1/2 tablet daily and follow-up in the office.

## 2020-05-13 DIAGNOSIS — Z87891 Personal history of nicotine dependence: Secondary | ICD-10-CM | POA: Diagnosis not present

## 2020-05-13 DIAGNOSIS — F329 Major depressive disorder, single episode, unspecified: Secondary | ICD-10-CM | POA: Diagnosis not present

## 2020-05-13 DIAGNOSIS — K651 Peritoneal abscess: Secondary | ICD-10-CM | POA: Diagnosis not present

## 2020-05-13 DIAGNOSIS — Z9049 Acquired absence of other specified parts of digestive tract: Secondary | ICD-10-CM | POA: Diagnosis not present

## 2020-05-13 DIAGNOSIS — T8142XD Infection following a procedure, deep incisional surgical site, subsequent encounter: Secondary | ICD-10-CM | POA: Diagnosis not present

## 2020-05-13 DIAGNOSIS — I2510 Atherosclerotic heart disease of native coronary artery without angina pectoris: Secondary | ICD-10-CM | POA: Diagnosis not present

## 2020-05-13 DIAGNOSIS — I10 Essential (primary) hypertension: Secondary | ICD-10-CM | POA: Diagnosis not present

## 2020-05-14 DIAGNOSIS — Z87891 Personal history of nicotine dependence: Secondary | ICD-10-CM | POA: Diagnosis not present

## 2020-05-14 DIAGNOSIS — I10 Essential (primary) hypertension: Secondary | ICD-10-CM | POA: Diagnosis not present

## 2020-05-14 DIAGNOSIS — I2510 Atherosclerotic heart disease of native coronary artery without angina pectoris: Secondary | ICD-10-CM | POA: Diagnosis not present

## 2020-05-14 DIAGNOSIS — K651 Peritoneal abscess: Secondary | ICD-10-CM | POA: Diagnosis not present

## 2020-05-14 DIAGNOSIS — Z9049 Acquired absence of other specified parts of digestive tract: Secondary | ICD-10-CM | POA: Diagnosis not present

## 2020-05-14 DIAGNOSIS — T8142XD Infection following a procedure, deep incisional surgical site, subsequent encounter: Secondary | ICD-10-CM | POA: Diagnosis not present

## 2020-05-14 DIAGNOSIS — F329 Major depressive disorder, single episode, unspecified: Secondary | ICD-10-CM | POA: Diagnosis not present

## 2020-05-15 DIAGNOSIS — Z9049 Acquired absence of other specified parts of digestive tract: Secondary | ICD-10-CM | POA: Diagnosis not present

## 2020-05-15 DIAGNOSIS — Z87891 Personal history of nicotine dependence: Secondary | ICD-10-CM | POA: Diagnosis not present

## 2020-05-15 DIAGNOSIS — K651 Peritoneal abscess: Secondary | ICD-10-CM | POA: Diagnosis not present

## 2020-05-15 DIAGNOSIS — I10 Essential (primary) hypertension: Secondary | ICD-10-CM | POA: Diagnosis not present

## 2020-05-15 DIAGNOSIS — T8142XD Infection following a procedure, deep incisional surgical site, subsequent encounter: Secondary | ICD-10-CM | POA: Diagnosis not present

## 2020-05-15 DIAGNOSIS — F329 Major depressive disorder, single episode, unspecified: Secondary | ICD-10-CM | POA: Diagnosis not present

## 2020-05-15 DIAGNOSIS — I2510 Atherosclerotic heart disease of native coronary artery without angina pectoris: Secondary | ICD-10-CM | POA: Diagnosis not present

## 2020-05-19 DIAGNOSIS — I10 Essential (primary) hypertension: Secondary | ICD-10-CM | POA: Diagnosis not present

## 2020-05-19 DIAGNOSIS — F329 Major depressive disorder, single episode, unspecified: Secondary | ICD-10-CM | POA: Diagnosis not present

## 2020-05-19 DIAGNOSIS — T8142XD Infection following a procedure, deep incisional surgical site, subsequent encounter: Secondary | ICD-10-CM | POA: Diagnosis not present

## 2020-05-19 DIAGNOSIS — Z87891 Personal history of nicotine dependence: Secondary | ICD-10-CM | POA: Diagnosis not present

## 2020-05-19 DIAGNOSIS — I2510 Atherosclerotic heart disease of native coronary artery without angina pectoris: Secondary | ICD-10-CM | POA: Diagnosis not present

## 2020-05-19 DIAGNOSIS — K651 Peritoneal abscess: Secondary | ICD-10-CM | POA: Diagnosis not present

## 2020-05-19 DIAGNOSIS — Z9049 Acquired absence of other specified parts of digestive tract: Secondary | ICD-10-CM | POA: Diagnosis not present

## 2020-05-20 DIAGNOSIS — Z87891 Personal history of nicotine dependence: Secondary | ICD-10-CM | POA: Diagnosis not present

## 2020-05-20 DIAGNOSIS — Z9049 Acquired absence of other specified parts of digestive tract: Secondary | ICD-10-CM | POA: Diagnosis not present

## 2020-05-20 DIAGNOSIS — I2510 Atherosclerotic heart disease of native coronary artery without angina pectoris: Secondary | ICD-10-CM | POA: Diagnosis not present

## 2020-05-20 DIAGNOSIS — T8142XD Infection following a procedure, deep incisional surgical site, subsequent encounter: Secondary | ICD-10-CM | POA: Diagnosis not present

## 2020-05-20 DIAGNOSIS — I10 Essential (primary) hypertension: Secondary | ICD-10-CM | POA: Diagnosis not present

## 2020-05-20 DIAGNOSIS — F329 Major depressive disorder, single episode, unspecified: Secondary | ICD-10-CM | POA: Diagnosis not present

## 2020-05-20 DIAGNOSIS — K651 Peritoneal abscess: Secondary | ICD-10-CM | POA: Diagnosis not present

## 2020-05-22 DIAGNOSIS — T8142XD Infection following a procedure, deep incisional surgical site, subsequent encounter: Secondary | ICD-10-CM | POA: Diagnosis not present

## 2020-05-22 DIAGNOSIS — Z87891 Personal history of nicotine dependence: Secondary | ICD-10-CM | POA: Diagnosis not present

## 2020-05-22 DIAGNOSIS — Z9049 Acquired absence of other specified parts of digestive tract: Secondary | ICD-10-CM | POA: Diagnosis not present

## 2020-05-22 DIAGNOSIS — I10 Essential (primary) hypertension: Secondary | ICD-10-CM | POA: Diagnosis not present

## 2020-05-22 DIAGNOSIS — F329 Major depressive disorder, single episode, unspecified: Secondary | ICD-10-CM | POA: Diagnosis not present

## 2020-05-22 DIAGNOSIS — K651 Peritoneal abscess: Secondary | ICD-10-CM | POA: Diagnosis not present

## 2020-05-22 DIAGNOSIS — I2510 Atherosclerotic heart disease of native coronary artery without angina pectoris: Secondary | ICD-10-CM | POA: Diagnosis not present

## 2020-05-25 DIAGNOSIS — Z87891 Personal history of nicotine dependence: Secondary | ICD-10-CM | POA: Diagnosis not present

## 2020-05-25 DIAGNOSIS — Z9049 Acquired absence of other specified parts of digestive tract: Secondary | ICD-10-CM | POA: Diagnosis not present

## 2020-05-25 DIAGNOSIS — I2510 Atherosclerotic heart disease of native coronary artery without angina pectoris: Secondary | ICD-10-CM | POA: Diagnosis not present

## 2020-05-25 DIAGNOSIS — I10 Essential (primary) hypertension: Secondary | ICD-10-CM | POA: Diagnosis not present

## 2020-05-25 DIAGNOSIS — T8142XD Infection following a procedure, deep incisional surgical site, subsequent encounter: Secondary | ICD-10-CM | POA: Diagnosis not present

## 2020-05-25 DIAGNOSIS — K651 Peritoneal abscess: Secondary | ICD-10-CM | POA: Diagnosis not present

## 2020-05-25 DIAGNOSIS — F329 Major depressive disorder, single episode, unspecified: Secondary | ICD-10-CM | POA: Diagnosis not present

## 2020-05-27 DIAGNOSIS — F329 Major depressive disorder, single episode, unspecified: Secondary | ICD-10-CM | POA: Diagnosis not present

## 2020-05-27 DIAGNOSIS — K651 Peritoneal abscess: Secondary | ICD-10-CM | POA: Diagnosis not present

## 2020-05-27 DIAGNOSIS — T8142XD Infection following a procedure, deep incisional surgical site, subsequent encounter: Secondary | ICD-10-CM | POA: Diagnosis not present

## 2020-05-27 DIAGNOSIS — Z9049 Acquired absence of other specified parts of digestive tract: Secondary | ICD-10-CM | POA: Diagnosis not present

## 2020-05-27 DIAGNOSIS — Z87891 Personal history of nicotine dependence: Secondary | ICD-10-CM | POA: Diagnosis not present

## 2020-05-27 DIAGNOSIS — I10 Essential (primary) hypertension: Secondary | ICD-10-CM | POA: Diagnosis not present

## 2020-05-27 DIAGNOSIS — I2510 Atherosclerotic heart disease of native coronary artery without angina pectoris: Secondary | ICD-10-CM | POA: Diagnosis not present

## 2020-06-02 ENCOUNTER — Ambulatory Visit: Payer: BC Managed Care – PPO | Admitting: Family Medicine

## 2020-06-02 ENCOUNTER — Ambulatory Visit: Payer: BC Managed Care – PPO

## 2020-06-02 VITALS — BP 108/60 | HR 54

## 2020-06-02 DIAGNOSIS — I119 Hypertensive heart disease without heart failure: Secondary | ICD-10-CM

## 2020-06-02 NOTE — Progress Notes (Signed)
Patient has lost 40 lbs since his recent colon surgery and his bp has been running lower.  His lisinopril was decreased from 40 mg to 20 mg.  His bp today in the surgeons office was 100/60.  He comes in for recheck of his bp and to get specific instructions about his medication.

## 2020-06-07 LAB — CUP PACEART REMOTE DEVICE CHECK
Date Time Interrogation Session: 20210919010605
Implantable Pulse Generator Implant Date: 20200128

## 2020-06-08 ENCOUNTER — Ambulatory Visit (INDEPENDENT_AMBULATORY_CARE_PROVIDER_SITE_OTHER): Payer: BC Managed Care – PPO | Admitting: Emergency Medicine

## 2020-06-08 DIAGNOSIS — I639 Cerebral infarction, unspecified: Secondary | ICD-10-CM | POA: Diagnosis not present

## 2020-06-09 DIAGNOSIS — N403 Nodular prostate with lower urinary tract symptoms: Secondary | ICD-10-CM | POA: Diagnosis not present

## 2020-06-09 DIAGNOSIS — N411 Chronic prostatitis: Secondary | ICD-10-CM | POA: Diagnosis not present

## 2020-06-09 DIAGNOSIS — R351 Nocturia: Secondary | ICD-10-CM | POA: Diagnosis not present

## 2020-06-11 NOTE — Progress Notes (Signed)
Carelink Summary Report / Loop Recorder 

## 2020-06-15 NOTE — Progress Notes (Addendum)
Subjective:  Patient ID: Daniel Mann, male    DOB: 1951-05-30  Age: 69 y.o. MRN: [id number]  Chief Complaint  Patient presents with  . Hyperlipidemia  . Hypertension   HPI Hypertension was first diagnosed several years ago.  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 His current cardiac medication regimen includes a beta-blocker ( metoprolol 25 mg one bid ) and an ACE inhibitor ( Lisinopril 20 mg once daily ).  He is tolerating the medication well without side effects.  Compliance with treatment has been good; he takes his medication as directed, maintains his diet and exercise regimen, and follows up as directed.  Cardiology given good reports. Carles presents with a diagnosis of nonrheumatic aortic (valve) stenosis.  Underwent AV replacement (BOVINE), as well as CABG (06/2018). The course has been stable and nonprogressive.  There are no associated symptoms.  FOLLOWS UP WITH DR Eden Emms.  Eating healthy. Exercising.    [name] presents with a diagnosis of impaired fasting glucose.  The course has been stable and nonprogressive.  Eating healthy and exercising.    Pt presents with hyperlipidemia. Intolerant to statins except Livalo 1 mg once daily, but insurance would not approve. Compliance with treatment has been good; he takes his medication as directed, maintains his low cholesterol diet and exercising.  [name] presents with a diagnosis of personal history of transient ischemic attack (TIA), and cerebral infarction without residual deficits.  This was diagnosed 09/2018.  He has no sequelae.  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  He presented to our office with an acute abdomen and sent to the emergency department.  He was found to have ischemic bowel and formation of an abscess.  He underwent right hemicolectomy with minimal complications other than a mild postop ileus.  He is doing very well and is returned to work.  Surgery was performed by Dr. Georgiana Shore.  Past  Medical History:  Diagnosis Date  . Acute encephalopathy 07/20/2018  . Aortic stenosis   . CAD (coronary artery disease)   . CHF (congestive heart failure) (HCC)   . GERD (gastroesophageal reflux disease)   . Heart murmur   . Hernia, inguinal, right   . Hyperlipidemia   . Hypertension   . Impaired fasting glucose   . Mixed hyperlipidemia   . Severe aortic stenosis 07/01/2018  . Umbilical hernia   . Vitamin D deficiency    Past Surgical History:  Procedure Laterality Date  . AORTIC VALVE REPLACEMENT N/A 07/12/2018   Procedure: AORTIC VALVE REPLACEMENT (AVR) using an Inspiris Valve size  23;  Surgeon: [name], MD;  Location: [facility] OR;  Service: Open Heart Surgery;  Laterality: N/A;  . CATARACT EXTRACTION, BILATERAL    . COLON SURGERY  04/2020   Rt hemicolectomy with reanastomosis. ischemic bowel.  . COLONOSCOPY    . CORONARY ARTERY BYPASS GRAFT N/A 07/12/2018   Procedure: CORONARY ARTERY BYPASS GRAFTING (CABG) times three using left internal mammary artery and right greater saphenous vein harvested endoscopically.;  Surgeon: [name], MD;  Location: [facility];  Service: Open Heart Surgery;  Laterality: N/A;  . INSERTION OF MESH  10/02/2019   Procedure: INSERTION OF MESH;  Surgeon: Manus Rudd, MD;  Location: [facility];  Service: General;;  . LOOP RECORDER INSERTION N/A 10/09/2018   Procedure: LOOP RECORDER INSERTION;  Surgeon: Hillis Range, MD;  [facility];  Service: Cardiovascular;  Laterality: N/A;  . RIGHT/LEFT HEART CATH AND CORONARY ANGIOGRAPHY  N/A 07/02/2018   Procedure: RIGHT/LEFT HEART CATH AND CORONARY ANGIOGRAPHY;  Surgeon: Lyn Records, MD;  Location: [facility] INVASIVE CV LAB;  Service: Cardiovascular;  Laterality: N/A;  . TEE WITHOUT CARDIOVERSION N/A 07/12/2018   Procedure: TRANSESOPHAGEAL ECHOCARDIOGRAM (TEE);  Surgeon: [name], MD;  Location: [facility] OR;  Service: Open Heart Surgery;  Laterality: N/A;  . UMBILICAL HERNIA REPAIR  N/A 10/02/2019   Procedure: UMBILICAL HERNIA REPAIR WITH MESH;  Surgeon: Manus Rudd, MD;  Location: [facility];  Service: General;  Laterality: N/A;    Family History  Problem Relation Age of Onset  . Alzheimer's disease Mother   . Renal Disease Father   . Cancer - Prostate Father    Social History   Socioeconomic History  . Marital status: Widowed    Spouse name: Not on file  . Number of children: Not on file  . Years of education: Not on file  . Highest education level: Not on file  Occupational History  . Not on file  Tobacco Use  . Smoking status: Former Games developer  . Smokeless tobacco: Never Used  Vaping Use  . Vaping Use: Never used  Substance and Sexual Activity  . Alcohol use: Yes    Comment: WINE occais  . Drug use: No  . Sexual activity: Not on file  Other Topics Concern  . Not on file  Social History Narrative  . Not on file   Social Determinants of Health   Financial Resource Strain:   . Difficulty of Paying Living Expenses: Not on file  Food Insecurity:   . Worried About Programme researcher, broadcasting/film/video in the Last Year: Not on file  . Ran Out of Food in the Last Year: Not on file  Transportation Needs:   . Lack of Transportation (Medical): Not on file  . Lack of Transportation (Non-Medical): Not on file  Physical Activity:   . Days of Exercise per Week: Not on file  . Minutes of Exercise per Session: Not on file  Stress:   . Feeling of Stress : Not on file  Social Connections:   . Frequency of Communication with Friends and Family: Not on file  . Frequency of Social Gatherings with Friends and Family: Not on file  . Attends Religious Services: Not on file  . Active Member of Clubs or Organizations: Not on file  . Attends Banker Meetings: Not on file  . Marital Status: Not on file    Review of Systems  Constitutional: Positive for unexpected weight change (after surgery). Negative for chills, fatigue and fever.  HENT: Negative  for congestion, ear pain and sore throat.   Respiratory: Negative for cough and shortness of breath.   Cardiovascular: Negative for chest pain.  Gastrointestinal: Negative for abdominal pain, constipation, diarrhea, nausea and vomiting.  Genitourinary: Negative for dysuria and frequency.  Musculoskeletal: Negative for arthralgias.  Neurological: Negative for dizziness and headaches.  Psychiatric/Behavioral: Negative for dysphoric mood. The patient is not nervous/anxious.        No dysphoria     Objective:  BP 120/64   Pulse 64   Temp (!) 97.2 F (36.2 C)   Resp 16   Ht 6' (1.829 m)   Wt 215 lb 3.2 oz (97.6 kg)   BMI 29.19 kg/m   BP/Weight 06/16/2020 06/02/2020 04/15/2020  Systolic BP 120 108 122  Diastolic BP 64 60 70  Wt. (Lbs) 215.2 - 233  BMI 29.19 - 31.6    Physical Exam  Vitals reviewed.  Constitutional:      Appearance: Normal appearance.  Neck:     Vascular: No carotid bruit.  Cardiovascular:     Rate and Rhythm: Normal rate and regular rhythm.     Pulses: Normal pulses.     Heart sounds: Normal heart sounds.  Pulmonary:     Effort: Pulmonary effort is normal.     Breath sounds: Normal breath sounds. No wheezing, rhonchi or rales.  Abdominal:     General: Bowel sounds are normal.     Palpations: Abdomen is soft.     Tenderness: There is no abdominal tenderness.  Neurological:     Mental Status: He is alert and oriented to person, place, and time.  Psychiatric:        Mood and Affect: Mood normal.        Behavior: Behavior normal.     Diabetic Foot Exam - Simple   No data filed      Assessment & Plan:  1. Mixed hyperlipidemia (significant coronary artery disease and atherosclerosis of  his aorta) Poorly controlled Start on repatha samples given. Failed on statins.  Continue to work on eating a healthy diet and exercise.  Labs drawn today.  - Lipid panel  2. Impaired fasting glucose Recommend continue to work on eating healthy diet and exercise. -  CBC with Differential/Platelet - Hemoglobin A1c  3. Hypertensive heart disease without heart failure Well controlled.  Decrease rx lisinopril to 20 mg once daily as his blood pressure has been better with decreased weight..  Continue to work on eating a healthy diet and exercise.  Labs drawn today.  - Comprehensive metabolic panel  4. S/P aortic valve replacement with bioprosthetic valve Follows up with cardiology.    5. Mild vitamin D deficiency - VITAMIN D 25 Hydroxy (Vit-D Deficiency, Fractures); Future  6. Atherosclerosis of aorta (HCC) Patient is intolerant to statins other than Livalo.  His liver enzymes increase and he has muscle pain.  Ordering Repatha.  Samples given. - Evolocumab (REPATHA SURECLICK) 140 MG/ML SOAJ; Inject 1 each into the skin every 14 (fourteen) days.  Dispense: 6 mL; Refill: 0  7. Coronary artery disease involving native coronary artery of native heart without angina pectoris Patient is intolerant to statins other than Livalo.  His liver enzymes increase and he has muscle pain.  Ordering Repatha.  Samples given. - Evolocumab (REPATHA SURECLICK) 140 MG/ML SOAJ; Inject 1 each into the skin every 14 (fourteen) days.  Dispense: 6 mL; Refill: 0  8. Need for immunization against influenza - Flu Vaccine QUAD High Dose(Fluad)  9. Chronic prostatitis Follow-up with Dr. Annabell Howells.  He currently is on doxycycline.   Meds ordered this encounter  Medications  . lisinopril (ZESTRIL) 20 MG tablet    Sig: Take 1 tablet (20 mg total) by mouth daily.    Dispense:  90 tablet    Refill:  1  . Evolocumab (REPATHA SURECLICK) 140 MG/ML SOAJ    Sig: Inject 1 each into the skin every 14 (fourteen) days.    Dispense:  6 mL    Refill:  0  . Evolocumab (REPATHA SURECLICK) 140 MG/ML SOAJ    Sig: Inject 1 each into the skin every 14 (fourteen) days.    Dispense:  6 mL    Refill:  0    Samples given    Orders Placed This Encounter  Procedures  . Flu Vaccine QUAD High  Dose(Fluad)  . CBC with Differential/Platelet  . Comprehensive metabolic panel  .  Lipid panel  . Hemoglobin A1c  . TSH  . VITAMIN D 25 Hydroxy (Vit-D Deficiency, Fractures)  . Cardiovascular Risk Assessment     Follow-up: Return in about 3 months (around 09/16/2020) for fasting.  An After Visit Summary was printed and given to the patient.  Blane OharaKirsten Lynise Porr [facility] [phone]

## 2020-06-16 ENCOUNTER — Other Ambulatory Visit: Payer: Self-pay | Admitting: Family Medicine

## 2020-06-16 ENCOUNTER — Other Ambulatory Visit: Payer: Self-pay

## 2020-06-16 ENCOUNTER — Encounter: Payer: Self-pay | Admitting: Family Medicine

## 2020-06-16 ENCOUNTER — Ambulatory Visit (INDEPENDENT_AMBULATORY_CARE_PROVIDER_SITE_OTHER): Payer: BC Managed Care – PPO | Admitting: Family Medicine

## 2020-06-16 VITALS — BP 120/64 | HR 64 | Temp 97.2°F | Resp 16 | Ht 72.0 in | Wt 215.2 lb

## 2020-06-16 DIAGNOSIS — I119 Hypertensive heart disease without heart failure: Secondary | ICD-10-CM

## 2020-06-16 DIAGNOSIS — Z23 Encounter for immunization: Secondary | ICD-10-CM

## 2020-06-16 DIAGNOSIS — I700 Atherosclerosis of aorta: Secondary | ICD-10-CM

## 2020-06-16 DIAGNOSIS — I2510 Atherosclerotic heart disease of native coronary artery without angina pectoris: Secondary | ICD-10-CM

## 2020-06-16 DIAGNOSIS — E782 Mixed hyperlipidemia: Secondary | ICD-10-CM | POA: Diagnosis not present

## 2020-06-16 DIAGNOSIS — Z953 Presence of xenogenic heart valve: Secondary | ICD-10-CM

## 2020-06-16 DIAGNOSIS — R7301 Impaired fasting glucose: Secondary | ICD-10-CM

## 2020-06-16 DIAGNOSIS — N411 Chronic prostatitis: Secondary | ICD-10-CM

## 2020-06-16 DIAGNOSIS — E559 Vitamin D deficiency, unspecified: Secondary | ICD-10-CM

## 2020-06-16 MED ORDER — LISINOPRIL 20 MG PO TABS: 20.0000 mg | ORAL_TABLET | Freq: Every day | ORAL | 1 refills | Status: DC

## 2020-06-16 MED ORDER — REPATHA SURECLICK 140 MG/ML ~~LOC~~ SOAJ: 1.0000 | SUBCUTANEOUS | 0 refills | Status: DC

## 2020-06-17 ENCOUNTER — Other Ambulatory Visit: Payer: Self-pay | Admitting: Family Medicine

## 2020-06-17 LAB — TSH: TSH: 2.18 u[IU]/mL (ref 0.450–4.500)

## 2020-06-17 LAB — COMPREHENSIVE METABOLIC PANEL
ALT: 21 IU/L (ref 0–44)
AST: 21 IU/L (ref 0–40)
Albumin/Globulin Ratio: 1.5 (ref 1.2–2.2)
Albumin: 3.8 g/dL (ref 3.8–4.8)
Alkaline Phosphatase: 91 IU/L (ref 44–121)
BUN/Creatinine Ratio: 16 (ref 10–24)
BUN: 11 mg/dL (ref 8–27)
Bilirubin Total: <0.2 mg/dL (ref 0.0–1.2)
CO2: 24 mmol/L (ref 20–29)
Calcium: 10.5 mg/dL — ABNORMAL HIGH (ref 8.6–10.2)
Chloride: 104 mmol/L (ref 96–106)
Creatinine, Ser: 0.68 mg/dL — ABNORMAL LOW (ref 0.76–1.27)
GFR calc Af Amer: 112 mL/min/{1.73_m2} (ref >59)
GFR calc non Af Amer: 97 mL/min/{1.73_m2} (ref >59)
Globulin, Total: 2.5 g/dL (ref 1.5–4.5)
Glucose: 110 mg/dL — ABNORMAL HIGH (ref 65–99)
Potassium: 4.4 mmol/L (ref 3.5–5.2)
Sodium: 141 mmol/L (ref 134–144)
Total Protein: 6.3 g/dL (ref 6.0–8.5)

## 2020-06-17 LAB — CBC WITH DIFFERENTIAL/PLATELET
Basophils Absolute: 0.1 10*3/uL (ref 0.0–0.2)
Basos: 1 %
EOS (ABSOLUTE): 0.3 10*3/uL (ref 0.0–0.4)
Eos: 3 %
Hematocrit: 34 % — ABNORMAL LOW (ref 37.5–51.0)
Hemoglobin: 11.4 g/dL — ABNORMAL LOW (ref 13.0–17.7)
Immature Grans (Abs): 0.1 10*3/uL (ref 0.0–0.1)
Immature Granulocytes: 1 %
Lymphocytes Absolute: 2.1 10*3/uL (ref 0.7–3.1)
Lymphs: 23 %
MCH: 30.8 pg (ref 26.6–33.0)
MCHC: 33.5 g/dL (ref 31.5–35.7)
MCV: 92 fL (ref 79–97)
Monocytes Absolute: 0.7 10*3/uL (ref 0.1–0.9)
Monocytes: 7 %
Neutrophils Absolute: 6.2 10*3/uL (ref 1.4–7.0)
Neutrophils: 65 %
Platelets: 528 10*3/uL — ABNORMAL HIGH (ref 150–450)
RBC: 3.7 x10E6/uL — ABNORMAL LOW (ref 4.14–5.80)
RDW: 13.8 % (ref 11.6–15.4)
WBC: 9.5 10*3/uL (ref 3.4–10.8)

## 2020-06-17 LAB — LIPID PANEL
Chol/HDL Ratio: 4.7 ratio (ref 0.0–5.0)
Cholesterol, Total: 177 mg/dL (ref 100–199)
HDL: 38 mg/dL — ABNORMAL LOW (ref >39)
LDL Chol Calc (NIH): 120 mg/dL — ABNORMAL HIGH (ref 0–99)
Triglycerides: 102 mg/dL (ref 0–149)
VLDL Cholesterol Cal: 19 mg/dL (ref 5–40)

## 2020-06-17 LAB — CARDIOVASCULAR RISK ASSESSMENT

## 2020-06-17 LAB — HEMOGLOBIN A1C
Est. average glucose Bld gHb Est-mCnc: 120 mg/dL
Hgb A1c MFr Bld: 5.8 % — ABNORMAL HIGH (ref 4.8–5.6)

## 2020-06-21 ENCOUNTER — Encounter: Payer: Self-pay | Admitting: Family Medicine

## 2020-06-22 ENCOUNTER — Telehealth: Payer: Self-pay

## 2020-06-22 NOTE — Telephone Encounter (Signed)
PA for Repatha submitted and denied via covermymeds despite patient being intolerant to statins except livalo. Additional info sent over from insurance and placed in Dr. Renea Ee folder to review.

## 2020-06-23 ENCOUNTER — Encounter: Payer: Self-pay | Admitting: Family Medicine

## 2020-06-25 ENCOUNTER — Telehealth: Payer: Self-pay

## 2020-06-25 ENCOUNTER — Ambulatory Visit (INDEPENDENT_AMBULATORY_CARE_PROVIDER_SITE_OTHER): Payer: BC Managed Care – PPO

## 2020-06-25 ENCOUNTER — Other Ambulatory Visit: Payer: Self-pay

## 2020-06-25 DIAGNOSIS — Z23 Encounter for immunization: Secondary | ICD-10-CM

## 2020-06-25 NOTE — Progress Notes (Signed)
° °  Covid-19 Vaccination Clinic  Name:  Daniel Mann    MRN: [id number] DOB: 04/15/1951  06/25/2020  Daniel Mann was observed post Covid-19 immunization for 15 minutes without incident. He was provided with Vaccine Information Sheet and instruction to access the V-Safe system.   Daniel Mann was instructed to call 911 with any severe reactions post vaccine:  Difficulty breathing   Swelling of face and throat   A fast heartbeat   A bad rash all over body   Dizziness and weakness

## 2020-06-25 NOTE — Telephone Encounter (Signed)
Repatha approved for 6 months through 12/23/2020.  Patient informed.

## 2020-06-30 ENCOUNTER — Ambulatory Visit: Payer: Medicare Other | Admitting: Sports Medicine

## 2020-07-01 ENCOUNTER — Ambulatory Visit (INDEPENDENT_AMBULATORY_CARE_PROVIDER_SITE_OTHER): Payer: BC Managed Care – PPO

## 2020-07-01 DIAGNOSIS — I639 Cerebral infarction, unspecified: Secondary | ICD-10-CM

## 2020-07-01 LAB — CUP PACEART REMOTE DEVICE CHECK
Date Time Interrogation Session: 20211020024227
Implantable Pulse Generator Implant Date: 20200128

## 2020-07-07 ENCOUNTER — Ambulatory Visit (INDEPENDENT_AMBULATORY_CARE_PROVIDER_SITE_OTHER): Payer: Self-pay | Admitting: Sports Medicine

## 2020-07-07 ENCOUNTER — Encounter: Payer: Self-pay | Admitting: Sports Medicine

## 2020-07-07 ENCOUNTER — Ambulatory Visit (INDEPENDENT_AMBULATORY_CARE_PROVIDER_SITE_OTHER): Payer: BC Managed Care – PPO

## 2020-07-07 ENCOUNTER — Other Ambulatory Visit: Payer: Self-pay

## 2020-07-07 ENCOUNTER — Other Ambulatory Visit: Payer: Self-pay | Admitting: *Deleted

## 2020-07-07 DIAGNOSIS — M79672 Pain in left foot: Secondary | ICD-10-CM

## 2020-07-07 DIAGNOSIS — M79671 Pain in right foot: Secondary | ICD-10-CM

## 2020-07-07 DIAGNOSIS — M2141 Flat foot [pes planus] (acquired), right foot: Secondary | ICD-10-CM

## 2020-07-07 DIAGNOSIS — M722 Plantar fascial fibromatosis: Secondary | ICD-10-CM

## 2020-07-07 DIAGNOSIS — M19079 Primary osteoarthritis, unspecified ankle and foot: Secondary | ICD-10-CM

## 2020-07-07 DIAGNOSIS — M2142 Flat foot [pes planus] (acquired), left foot: Secondary | ICD-10-CM

## 2020-07-07 DIAGNOSIS — M792 Neuralgia and neuritis, unspecified: Secondary | ICD-10-CM

## 2020-07-07 NOTE — Patient Instructions (Signed)
Stretch every morning before getting out of bed  Wear compression garments to help offer support to feet and legs during the day  Remove compression garments in the evening or at bedtime and may apply topical Voltaren that you can get over-the-counter to both feet as needed for pain  Use frozen water bottle in the evenings for 15 to 20 minutes per foot for a gentle ice massage  Continue with good supportive shoes  Return to office as scheduled for custom orthotics

## 2020-07-07 NOTE — Progress Notes (Signed)
Subjective: Daniel Mann is a 69 y.o. male patient presents to office with complaint of mild achy pain to both feet especially along the arch and heel area.  Patient reports that he has had pain in his feet for years has been working in grocery store since age 69.  Patient reports that he has invested and good supportive shoes and also recently got insoles which seems to help.  Patient reports that he also experiences some tingling to both feet by the end of the day.  Patient also reports that he has some tightness along the calf and the backs of his legs as well.  Patient denies any significant redness warmth swelling drainage or any other constitutional symptoms at this time.  Patient admits that he will plan to work until he dies because he does not want to retire because he has lost his wife and does not want to be at home alone and enjoys work.  Review of systems noncontributory.  Patient Active Problem List   Diagnosis Date Noted  . Intraperitoneal abscess (HCC) 04/30/2020  . 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/2020  . Class 1 obesity due to excess calories with serious comorbidity and body mass index (BMI) of 32.0 to 32.9 in adult 02/27/2020  . Chronic prostatitis 07/21/2018  . 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 S/P aortic valve replacement with bioprosthetic valve 07/12/2018  . Coronary artery disease 07/12/2018  . Hypertension   . Heart murmur   . GERD (gastroesophageal reflux disease)   . Impaired fasting glucose   . Vitamin D deficiency   . Mixed hyperlipidemia     Current Outpatient Medications on File Prior to Visit  Medication Sig Dispense Refill  . acetaminophen (TYLENOL) 325 MG tablet Take 2 tablets (650 mg total) by mouth every 6 (six) hours as needed for mild pain.    Marland Kitchen aspirin EC 325 MG EC tablet Take 1 tablet (325 mg total) by mouth daily. 30 tablet 0  . doxycycline (VIBRA-TABS) 100 MG tablet Take 100 mg by mouth 2 (two) times daily.    Marland Kitchen esomeprazole (NEXIUM) 40 MG capsule Take 1  capsule (40 mg total) by mouth daily. 90 capsule 1  . Evolocumab (REPATHA SURECLICK) 140 MG/ML SOAJ Inject 1 each into the skin every 14 (fourteen) days. 6 mL 0  . Evolocumab (REPATHA SURECLICK) 140 MG/ML SOAJ Inject 1 each into the skin every 14 (fourteen) days. 6 mL 0  . lisinopril (ZESTRIL) 20 MG tablet Take 1 tablet (20 mg total) by mouth daily. 90 tablet 1  . metoprolol tartrate (LOPRESSOR) 25 MG tablet Take 1 tablet (25 mg total) by mouth 2 (two) times daily. 180 tablet 1  . Multiple Vitamin (MULTIVITAMIN) tablet Take 1 tablet by mouth daily.    . Vitamin D, Ergocalciferol, (DRISDOL) 1.25 MG (50000 UNIT) CAPS capsule TAKE 1 CAPSULE(S) BY MOUTH TWICE A WEEK 24 capsule 2   No current facility-administered medications on file prior to visit.    No Known Allergies  Objective: Physical Exam General: The patient is alert and oriented x3 in no acute distress.  Dermatology: Skin is warm, dry and supple bilateral lower extremities. Nails 1-10 are normal. There is no erythema, edema, no eccymosis, no open lesions present. Integument is otherwise unremarkable.  Vascular: Dorsalis Pedis pulse and Posterior Tibial pulse are 1/4 bilateral. Capillary fill time is immediate to all digits.  Neurological: Grossly intact to light touch bilateral.  Subjective tingling bilateral.  Musculoskeletal: Minimal reproducible tenderness along the plantar fascial  insertion and heels bilateral however subjectively the more that he is on his feet this area hurts. There is decreased Ankle joint range of motion bilateral. All other joints range of motion within normal limits bilateral. Strength 5/5 in all groups bilateral.  Pes planus foot type with mild digital contractures.  Gait: Nonantalgic  Xray, Right/Left foot:  Normal osseous mineralization. Joint spaces preserved except midfoot and ankle consistent with arthritis with midtarsal breech supportive of pes planus. No fracture/dislocation/boney destruction.  Calcaneal spur present with mild thickening of plantar fascia. No other soft tissue abnormalities or radiopaque foreign bodies.   Assessment and Plan: Problem List Items Addressed This Visit    None    Visit Diagnoses    Pain of both heels    -  Primary   Relevant Orders   DG Foot Complete Right   DG Foot Complete Left   Pes planus of both feet       Arthritis of foot       Plantar fasciitis, bilateral       Foot pain, bilateral       Neuritis          -Complete examination performed.  -Xrays reviewed -Discussed with patient in detail the condition of plantar fasciitis with neuritis, how this occurs and general treatment options. Explained both conservative and surgical treatments.  -Added additional felt padding to over-the-counter orthotic in both his shoes and advised patient to be measured for custom molded orthotics -Recommend to continue with good supportive shoes daily -Explained and dispensed to patient daily stretching exercises. -Recommend patient to ice affected area 1-2x daily. -Advised over-the-counter as well as compression garment from [place] -Patient to return to office as scheduled for custom orthotics or sooner if problems or questions arise.  Asencion Islam, DPM

## 2020-07-07 NOTE — Progress Notes (Signed)
Carelink Summary Report / Loop Recorder 

## 2020-07-08 NOTE — Addendum Note (Signed)
Addended by: Geralyn Flash D on: 07/08/2020 01:11 PM   Modules accepted: Level of Service

## 2020-08-03 ENCOUNTER — Ambulatory Visit (INDEPENDENT_AMBULATORY_CARE_PROVIDER_SITE_OTHER): Payer: BC Managed Care – PPO

## 2020-08-03 DIAGNOSIS — I639 Cerebral infarction, unspecified: Secondary | ICD-10-CM

## 2020-08-04 LAB — CUP PACEART REMOTE DEVICE CHECK
Date Time Interrogation Session: 20211120014233
Implantable Pulse Generator Implant Date: 20200128

## 2020-08-04 NOTE — Progress Notes (Signed)
Carelink Summary Report / Loop Recorder 

## 2020-08-12 DIAGNOSIS — N401 Enlarged prostate with lower urinary tract symptoms: Secondary | ICD-10-CM | POA: Diagnosis not present

## 2020-08-12 DIAGNOSIS — N411 Chronic prostatitis: Secondary | ICD-10-CM | POA: Diagnosis not present

## 2020-08-12 DIAGNOSIS — R351 Nocturia: Secondary | ICD-10-CM | POA: Diagnosis not present

## 2020-08-19 ENCOUNTER — Other Ambulatory Visit: Payer: Medicare Other | Admitting: Orthotics

## 2020-08-21 ENCOUNTER — Other Ambulatory Visit: Payer: Self-pay

## 2020-08-21 ENCOUNTER — Ambulatory Visit (INDEPENDENT_AMBULATORY_CARE_PROVIDER_SITE_OTHER): Payer: BC Managed Care – PPO | Admitting: Orthotics

## 2020-08-21 DIAGNOSIS — M79672 Pain in left foot: Secondary | ICD-10-CM

## 2020-08-21 DIAGNOSIS — M722 Plantar fascial fibromatosis: Secondary | ICD-10-CM

## 2020-08-21 DIAGNOSIS — M79671 Pain in right foot: Secondary | ICD-10-CM | POA: Diagnosis not present

## 2020-08-21 DIAGNOSIS — M19079 Primary osteoarthritis, unspecified ankle and foot: Secondary | ICD-10-CM

## 2020-08-21 DIAGNOSIS — M2142 Flat foot [pes planus] (acquired), left foot: Secondary | ICD-10-CM

## 2020-08-21 DIAGNOSIS — M792 Neuralgia and neuritis, unspecified: Secondary | ICD-10-CM

## 2020-08-21 DIAGNOSIS — M2141 Flat foot [pes planus] (acquired), right foot: Secondary | ICD-10-CM

## 2020-08-21 NOTE — Progress Notes (Signed)
Patient came into today for casting bilateral f/o to address plantar fasciitis.  Patient reports history of foot pain involving plantar aponeurosis.  Goal is to provide longitudinal arch support and correct any RF instability due to heel eversion/inversion.  Ultimate goal is to relieve tension at pf insertion calcaneal tuberosity.  Plan on semi-rigid device addressing heel stability and relieving PF tension.     Patient signed Medicare ABN

## 2020-09-01 ENCOUNTER — Ambulatory Visit (INDEPENDENT_AMBULATORY_CARE_PROVIDER_SITE_OTHER): Payer: BC Managed Care – PPO

## 2020-09-01 DIAGNOSIS — I639 Cerebral infarction, unspecified: Secondary | ICD-10-CM

## 2020-09-01 LAB — CUP PACEART REMOTE DEVICE CHECK
Date Time Interrogation Session: 20211221020359
Implantable Pulse Generator Implant Date: 20200128

## 2020-09-15 NOTE — Progress Notes (Signed)
Carelink Summary Report / Loop Recorder 

## 2020-09-16 NOTE — Addendum Note (Signed)
Addended by: Geralyn Flash D on: 09/16/2020 01:08 PM   Modules accepted: Level of Service

## 2020-09-29 ENCOUNTER — Ambulatory Visit: Payer: BC Managed Care – PPO | Admitting: Family Medicine

## 2020-10-02 ENCOUNTER — Ambulatory Visit (INDEPENDENT_AMBULATORY_CARE_PROVIDER_SITE_OTHER): Payer: BC Managed Care – PPO

## 2020-10-02 DIAGNOSIS — I639 Cerebral infarction, unspecified: Secondary | ICD-10-CM

## 2020-10-02 LAB — CUP PACEART REMOTE DEVICE CHECK
Date Time Interrogation Session: 20220121021447
Implantable Pulse Generator Implant Date: 20200128

## 2020-10-06 DIAGNOSIS — K432 Incisional hernia without obstruction or gangrene: Secondary | ICD-10-CM | POA: Diagnosis not present

## 2020-10-14 DIAGNOSIS — K469 Unspecified abdominal hernia without obstruction or gangrene: Secondary | ICD-10-CM | POA: Diagnosis not present

## 2020-10-14 DIAGNOSIS — K5790 Diverticulosis of intestine, part unspecified, without perforation or abscess without bleeding: Secondary | ICD-10-CM | POA: Diagnosis not present

## 2020-10-14 DIAGNOSIS — K432 Incisional hernia without obstruction or gangrene: Secondary | ICD-10-CM | POA: Diagnosis not present

## 2020-10-14 DIAGNOSIS — K439 Ventral hernia without obstruction or gangrene: Secondary | ICD-10-CM | POA: Diagnosis not present

## 2020-10-14 NOTE — Progress Notes (Signed)
Carelink Summary Report / Loop Recorder 

## 2020-10-16 ENCOUNTER — Other Ambulatory Visit: Payer: Self-pay | Admitting: Family Medicine

## 2020-10-16 DIAGNOSIS — Z953 Presence of xenogenic heart valve: Secondary | ICD-10-CM

## 2020-10-16 DIAGNOSIS — I2510 Atherosclerotic heart disease of native coronary artery without angina pectoris: Secondary | ICD-10-CM

## 2020-10-16 DIAGNOSIS — I700 Atherosclerosis of aorta: Secondary | ICD-10-CM

## 2020-10-16 DIAGNOSIS — I119 Hypertensive heart disease without heart failure: Secondary | ICD-10-CM

## 2020-10-20 ENCOUNTER — Telehealth: Payer: Self-pay | Admitting: *Deleted

## 2020-10-20 NOTE — Telephone Encounter (Signed)
   [facility] Pre-operative Risk Assessment    HEARTCARE STAFF: - Please ensure there is not already an duplicate clearance open for this procedure. - Under Visit Info/Reason for Call, type in Other and utilize the format Clearance MM/DD/YY or Clearance TBD. Do not use dashes or single digits. - If request is for dental extraction, please clarify the # of teeth to be extracted.  Request for surgical clearance:  1. What type of surgery is being performed? LAPAROSCOPIC INCISIONAL HERNIA REPAIR WITH MESH POSSIBLE OPEN  2. When is this surgery scheduled? 10/28/20   3. What type of clearance is required (medical clearance vs. Pharmacy clearance to hold med vs. Both)? MEDICAL  4. Are there any medications that need to be held prior to surgery and how long? ASA   5. Practice name and name of physician performing surgery? [facility]; DR. Jerel Shepherd   6. What is the office phone number? [phone]   7.   What is the office fax number? [phone]  8.   Anesthesia type (None, local, MAC, general) ? GENERAL   Julaine Hua 10/20/2020, 8:20 AM  _________________________________________________________________   (provider comments below)

## 2020-10-20 NOTE — Telephone Encounter (Signed)
   Primary Cardiologist: Charlton Haws, MD  Chart reviewed as part of pre-operative protocol coverage. Because of Daniel Mann past medical history and time since last visit, he will require a follow-up visit in order to better assess preoperative cardiovascular risk.  Pre-op covering staff: - Please schedule appointment and call patient to inform them. If patient already had an upcoming appointment within acceptable timeframe, please add "pre-op clearance" to the appointment notes so provider is aware. - Please contact requesting surgeon's office via preferred method (i.e, phone, fax) to inform them of need for appointment prior to surgery.  If applicable, this message will also be routed to pharmacy pool and/or primary cardiologist for input on holding anticoagulant/antiplatelet agent as requested below so that this information is available to the clearing provider at time of patient's appointment.   Roe Rutherford Duke, PA  10/20/2020, 11:04 AM

## 2020-10-21 ENCOUNTER — Other Ambulatory Visit: Payer: Self-pay

## 2020-10-21 ENCOUNTER — Ambulatory Visit: Payer: BC Managed Care – PPO | Admitting: Orthotics

## 2020-10-21 DIAGNOSIS — M79672 Pain in left foot: Secondary | ICD-10-CM

## 2020-10-21 DIAGNOSIS — M19079 Primary osteoarthritis, unspecified ankle and foot: Secondary | ICD-10-CM

## 2020-10-21 DIAGNOSIS — M2142 Flat foot [pes planus] (acquired), left foot: Secondary | ICD-10-CM

## 2020-10-21 DIAGNOSIS — M2141 Flat foot [pes planus] (acquired), right foot: Secondary | ICD-10-CM

## 2020-10-21 DIAGNOSIS — M79671 Pain in right foot: Secondary | ICD-10-CM

## 2020-10-21 NOTE — Progress Notes (Signed)
Patient picked up f/o and was pleased with fit, comfort, and function.  Worked well with footwear.  Told of rbeak in period and how to report any issues.  

## 2020-10-23 DIAGNOSIS — Z1152 Encounter for screening for COVID-19: Secondary | ICD-10-CM | POA: Diagnosis not present

## 2020-10-26 NOTE — Telephone Encounter (Signed)
Pt not seen since 06/2019. He needs an appt in the office for surgical clearance. Dr. Eden Emms is his MD.  He can see Dr. Eden Emms, any APP on his team, any other available APP or any available MD. Tereso Newcomer, PA-C    10/26/2020 3:21 PM

## 2020-10-26 NOTE — Telephone Encounter (Signed)
Patient has scheduled an appointment for tomorrow, 10/27/2020 at 10:40 AM to see Dr Anne Fu for pre-op clearance. Will route note to Dr Anne Fu and also fax to requesting surgeons office to make them aware.

## 2020-10-26 NOTE — Telephone Encounter (Signed)
Mark Patient is seeing you tomorrow.  No provider on his care team available. Pt needs surgical clearance and not seen in office in > 1 year. Please send your note to the surgeon. Thanks [place], [place]    10/26/2020 4:57 PM

## 2020-10-26 NOTE — Telephone Encounter (Signed)
Darl Pikes from surgery office called in and stated pt has really had his covid .  Pt surgery is wed.  They would like to know if anyone could get him in tomorrow   I have looked and nothing available that I could sch in to  Best number [phone]

## 2020-10-27 ENCOUNTER — Other Ambulatory Visit: Payer: Self-pay

## 2020-10-27 ENCOUNTER — Encounter: Payer: Self-pay | Admitting: Cardiology

## 2020-10-27 ENCOUNTER — Ambulatory Visit (INDEPENDENT_AMBULATORY_CARE_PROVIDER_SITE_OTHER): Payer: BC Managed Care – PPO | Admitting: Cardiology

## 2020-10-27 VITALS — BP 140/80 | HR 56 | Ht 72.0 in | Wt 234.0 lb

## 2020-10-27 DIAGNOSIS — Z952 Presence of prosthetic heart valve: Secondary | ICD-10-CM

## 2020-10-27 DIAGNOSIS — I639 Cerebral infarction, unspecified: Secondary | ICD-10-CM | POA: Diagnosis not present

## 2020-10-27 DIAGNOSIS — Z01818 Encounter for other preprocedural examination: Secondary | ICD-10-CM

## 2020-10-27 DIAGNOSIS — I480 Paroxysmal atrial fibrillation: Secondary | ICD-10-CM

## 2020-10-27 NOTE — Progress Notes (Signed)
Cardiology Office Note:    Date:  10/27/2020   ID:  Daniel Mann, DOB November 25, 1950, MRN [id number]  PCP:  No primary care provider on file.   [facility]  Cardiologist:  Charlton Haws, MD  Advanced Practice Provider:  No care team member to display Electrophysiologist:  None       Referring MD: Daniel Ohara, MD     History of Present Illness:    Daniel Mann is a 70 y.o. male here for preoperative risk stratification, hernia mesh, Dr. Terrilee Files, [facility].   Has coronary artery disease status post CABG 07/12/2018-LIMA to LAD, SVG to OM 2, SVG to RCA, aortic valve replacement 23 mm Edwards Inspiris Resilia pericardial valve.   Previously an implantable loop recorder was placed by Dr. Johney Frame for cryptogenic stroke in 2020.  Today no PAF has been detected.  He has been battling this large ventral hernia for quite some time.  Ready to have it repaired.  04/16/2020 - colectomy doing well. Food [employer]. 46 years.   Doing very well, asymptomatic, no anginal symptoms no syncope no shortness of breath.  Very active.  Unfortunately his wife died about 2 years ago.  Has a 3 bedroom house still likes to work 7 days a week, does not like to sit around in his house.   Past Medical History:  Diagnosis Date   Acute encephalopathy 07/20/2018   Aortic stenosis    CAD (coronary artery disease)    CHF (congestive heart failure) (HCC)    GERD (gastroesophageal reflux disease)    Heart murmur    Hernia, inguinal, right    Hyperlipidemia    Hypertension    Impaired fasting glucose    Mixed hyperlipidemia    Severe aortic stenosis 07/01/2018   Umbilical hernia    Vitamin D deficiency     Past Surgical History:  Procedure Laterality Date   AORTIC VALVE REPLACEMENT N/A 07/12/2018   Procedure: AORTIC VALVE REPLACEMENT (AVR) using an Inspiris Valve size  23;  Surgeon: [name], MD;  Location: [facility] OR;  Service: Open Heart Surgery;  Laterality: N/A;    CATARACT EXTRACTION, BILATERAL     COLON SURGERY  04/2020   Rt hemicolectomy with reanastomosis. ischemic bowel.   COLONOSCOPY     CORONARY ARTERY BYPASS GRAFT N/A 07/12/2018   Procedure: CORONARY ARTERY BYPASS GRAFTING (CABG) times three using left internal mammary artery and right greater saphenous vein harvested endoscopically.;  Surgeon: [name], MD;  Location: [facility] OR;  Service: Open Heart Surgery;  Laterality: N/A;   INSERTION OF MESH  10/02/2019   Procedure: INSERTION OF MESH;  Surgeon: Manus Rudd, MD;  Location: [facility];  Service: General;;   LOOP RECORDER INSERTION N/A 10/09/2018   Procedure: LOOP RECORDER INSERTION;  Surgeon: Hillis Range, MD;  Location: [facility] INVASIVE [facility];  Service: Cardiovascular;  Laterality: N/A;   RIGHT/LEFT HEART CATH AND CORONARY ANGIOGRAPHY N/A 07/02/2018   Procedure: RIGHT/LEFT HEART CATH AND CORONARY ANGIOGRAPHY;  Surgeon: Lyn Records, MD;  Location: [facility]AS[facility];  Service: Cardiovascular;  Laterality: N/A;   TEE WITHOUT CARDIOVERSION N/A 07/12/2018   Procedure: TRANSESOPHAGEAL ECHOCARDIOGRAM (TEE);  Surgeon: [name], MD;  Location: [facility] OR;  Service: Open Heart Surgery;  Laterality: N/A;   UMBILICAL HERNIA REPAIR N/A 10/02/2019   Procedure: UMBILICAL HERNIA REPAIR WITH MESH;  Surgeon: Manus Rudd, MD;  Location: [facility];  Service: General;  Laterality: N/A;    Current Medications:  Current Meds  Medication Sig   acetaminophen (TYLENOL) 325 MG tablet Take 2 tablets (650 mg total) by mouth every 6 (six) hours as needed for mild pain.   doxycycline (VIBRA-TABS) 100 MG tablet Take 100 mg by mouth 2 (two) times daily.   esomeprazole (NEXIUM) 40 MG capsule Take 1 capsule (40 mg total) by mouth daily.   Evolocumab (REPATHA SURECLICK) 140 MG/ML SOAJ Inject 1 each into the skin every 14 (fourteen) days.   lisinopril (ZESTRIL) 20 MG tablet Take 1 tablet (20 mg total) by mouth daily.    metoprolol tartrate (LOPRESSOR) 25 MG tablet Take 1 tablet (25 mg total) by mouth 2 (two) times daily.   Multiple Vitamin (MULTIVITAMIN) tablet Take 1 tablet by mouth daily.   Vitamin D, Ergocalciferol, (DRISDOL) 1.25 MG (50000 UNIT) CAPS capsule TAKE 1 CAPSULE(S) BY MOUTH TWICE A WEEK     Allergies:   Patient has no known allergies.   Social History   Socioeconomic History   Marital status: Widowed    Spouse name: Not on file   Number of children: Not on file   Years of education: Not on file   Highest education level: Not on file  Occupational History   Not on file  Tobacco Use   Smoking status: Former Smoker   Smokeless tobacco: Never Used  Building services engineer Use: Never used  Substance and Sexual Activity   Alcohol use: Yes    Comment: WINE occais   Drug use: No   Sexual activity: Not on file  Other Topics Concern   Not on file  Social History Narrative   Not on file   Social Determinants of Health   Financial Resource Strain: Not on file  Food Insecurity: Not on file  Transportation Needs: Not on file  Physical Activity: Not on file  Stress: Not on file  Social Connections: Not on file     Family History: The patient's family history includes Alzheimer's disease in his mother; Cancer - Prostate in his father; Renal Disease in his father.  ROS:   Please see the history of present illness.     All other systems reviewed and are negative.  EKGs/Labs/Other Studies Reviewed:    The following studies were reviewed today:  ECHO 09/16/19:  1. Left ventricular ejection fraction, by visual estimation, is 65 to  70%. The left ventricle has normal function. There is mildly increased  left ventricular hypertrophy.  2. The left ventricle has no regional wall motion abnormalities.  3. Global right ventricle has mildly reduced systolic function.The right  ventricular size is normal. No increase in right ventricular wall  thickness.  4. Left atrial  size was moderately dilated.  5. Right atrial size was normal.  6. The mitral valve is grossly normal. Trivial mitral valve  regurgitation.  7. The tricuspid valve is grossly normal.  8. Aortic valve area, by VTI measures 1.22 cm.  9. Aortic valve mean gradient measures 14.0 mmHg. Valve replaced 10. Aortic valve peak gradient measures 28.1 mmHg.  11. Aortic valve regurgitation is not visualized.  12. The pulmonic valve was grossly normal. Pulmonic valve regurgitation is  trivial.  13. Normal pulmonary artery systolic pressure.  14. The inferior vena cava is normal in size with greater than 50%  respiratory variability, suggesting right atrial pressure of 3 mmHg.  15. A prior study was performed on 06/19/2018.  16. Changes from prior study are noted.  17. Prior echo: LVEF 60-65%, severe AS.  EKG:  EKG is  ordered today.  The ekg ordered today demonstrates sinus bradycardia 56 nonspecific ST-T wave changes, no significant change from prior.  Recent Labs: 02/27/2020: Magnesium 2.1 06/16/2020: ALT 21; BUN 11; Creatinine, Ser 0.68; Hemoglobin 11.4; Platelets 528; Potassium 4.4; Sodium 141; TSH 2.180  Recent Lipid Panel    Component Value Date/Time   CHOL 177 06/16/2020 0813   TRIG 102 06/16/2020 0813   HDL 38 (L) 06/16/2020 0813   CHOLHDL 4.7 06/16/2020 0813   CHOLHDL 4.6 07/22/2018 0553   VLDL 22 07/22/2018 0553   LDLCALC 120 (H) 06/16/2020 0813     Risk Assessment/Calculations:      Physical Exam:    VS:  BP 140/80 (BP Location: Left Arm, Patient Position: Sitting, Cuff Size: Normal)    Pulse (!) 56    Ht 6' (1.829 m)    Wt 234 lb (106.1 kg)    SpO2 96%    BMI 31.74 kg/m     Wt Readings from Last 3 Encounters:  10/27/20 234 lb (106.1 kg)  06/15/20 215 lb 3.2 oz (97.6 kg)  04/15/20 233 lb (105.7 kg)     GEN:  Well nourished, well developed in no acute distress HEENT: Normal NECK: No JVD; No carotid bruits LYMPHATICS: No lymphadenopathy CARDIAC: RRR, no murmurs,  rubs, gallops, CABG scar RESPIRATORY:  Clear to auscultation without rales, wheezing or rhonchi  ABDOMEN: Soft, non-tender, non-distended, ventral hernia noted MUSCULOSKELETAL:  No edema; No deformity  SKIN: Warm and dry NEUROLOGIC:  Alert and oriented x 3 PSYCHIATRIC:  Normal affect   ASSESSMENT:    1. PAF (paroxysmal atrial fibrillation) (HCC)   2. Pre-op evaluation   3. S/P AVR   4. Cryptogenic stroke [facility])    PLAN:    In order of problems listed above:  Preoperative risk stratification -With recent revascularization in 2019, CABG and no anginal symptoms, no syncope no signs of heart failure, he may proceed with ventral hernia surgery without any further cardiac testing with moderate overall cardiac risk given his prior CAD.  He is able to complete greater than 4 METS of activity without difficulty.  Postoperatively would consider aspirin 81 therapy lifelong given his bioprosthetic valve.  Aortic valve replacement 2019 -Pericardial tissue valve 23 mm, stable. -Dental prophylaxis  Coronary artery disease -Post CABG 2019 described above in HPI.  Overall doing well without any anginal symptoms.  Cryptogenic stroke -There was concern for occult paroxysmal atrial fibrillation.  Implantable loop recorder by Dr. Johney Frame.  No evidence of A. Fib.  Still recording.  Continue follow-up with Dr. Eden Emms  Medication Adjustments/Labs and Tests Ordered: Current medicines are reviewed at length with the patient today.  Concerns regarding medicines are outlined above.  Orders Placed This Encounter  Procedures   EKG 12-Lead   No orders of the defined types were placed in this encounter.   Patient Instructions  Medication Instructions:  The current medical regimen is effective;  continue present plan and medications.  *If you need a refill on your cardiac medications before your next appointment, please call your pharmacy*  Follow-Up: At [facility], you and your health needs are  our priority.  As part of our continuing mission to provide you with exceptional heart care, we have created designated Provider Care Teams.  These Care Teams include your primary Cardiologist (physician) and Advanced Practice Providers (APPs -  Physician Assistants and Nurse Practitioners) who all work together to provide you with the care you need, when you need it.  We recommend signing up for the patient portal called "MyChart".  Sign up information is provided on this After Visit Summary.  MyChart is used to connect with patients for Virtual Visits (Telemedicine).  Patients are able to view lab/test results, encounter notes, upcoming appointments, etc.  Non-urgent messages can be sent to your provider as well.   To learn more about what you can do with MyChart, go to [email or web address].    Your next appointment:   Follow up with Dr Eden EmmsNishan as previously scheduled.   Thank you for choosing [facility]!!    You have been cleared from a cardiac standpoint to move ahead with your planned surgery.    Signed, Donato SchultzMark Darinda [place], MD  10/27/2020 10:46 AM    [facility]

## 2020-10-27 NOTE — Patient Instructions (Signed)
Medication Instructions:  The current medical regimen is effective;  continue present plan and medications.  *If you need a refill on your cardiac medications before your next appointment, please call your pharmacy*  Follow-Up: At [facility], you and your health needs are our priority.  As part of our continuing mission to provide you with exceptional heart care, we have created designated Provider Care Teams.  These Care Teams include your primary Cardiologist (physician) and Advanced Practice Providers (APPs -  Physician Assistants and Nurse Practitioners) who all work together to provide you with the care you need, when you need it.  We recommend signing up for the patient portal called "MyChart".  Sign up information is provided on this After Visit Summary.  MyChart is used to connect with patients for Virtual Visits (Telemedicine).  Patients are able to view lab/test results, encounter notes, upcoming appointments, etc.  Non-urgent messages can be sent to your provider as well.   To learn more about what you can do with MyChart, go to [email or web address].    Your next appointment:   Follow up with Dr Eden Emms as previously scheduled.   Thank you for choosing [facility]!!    You have been cleared from a cardiac standpoint to move ahead with your planned surgery.

## 2020-10-28 DIAGNOSIS — Z87891 Personal history of nicotine dependence: Secondary | ICD-10-CM | POA: Diagnosis not present

## 2020-10-28 DIAGNOSIS — K430 Incisional hernia with obstruction, without gangrene: Secondary | ICD-10-CM | POA: Diagnosis not present

## 2020-10-28 DIAGNOSIS — I2510 Atherosclerotic heart disease of native coronary artery without angina pectoris: Secondary | ICD-10-CM | POA: Diagnosis not present

## 2020-10-28 DIAGNOSIS — I10 Essential (primary) hypertension: Secondary | ICD-10-CM | POA: Diagnosis not present

## 2020-10-28 DIAGNOSIS — K420 Umbilical hernia with obstruction, without gangrene: Secondary | ICD-10-CM | POA: Diagnosis not present

## 2020-10-28 DIAGNOSIS — K432 Incisional hernia without obstruction or gangrene: Secondary | ICD-10-CM | POA: Diagnosis not present

## 2020-10-28 DIAGNOSIS — Z79899 Other long term (current) drug therapy: Secondary | ICD-10-CM | POA: Diagnosis not present

## 2020-10-28 DIAGNOSIS — E785 Hyperlipidemia, unspecified: Secondary | ICD-10-CM | POA: Diagnosis not present

## 2020-10-28 DIAGNOSIS — K219 Gastro-esophageal reflux disease without esophagitis: Secondary | ICD-10-CM | POA: Diagnosis not present

## 2020-10-28 DIAGNOSIS — Z951 Presence of aortocoronary bypass graft: Secondary | ICD-10-CM | POA: Diagnosis not present

## 2020-10-28 DIAGNOSIS — E559 Vitamin D deficiency, unspecified: Secondary | ICD-10-CM | POA: Diagnosis not present

## 2020-10-28 DIAGNOSIS — Z8673 Personal history of transient ischemic attack (TIA), and cerebral infarction without residual deficits: Secondary | ICD-10-CM | POA: Diagnosis not present

## 2020-11-04 ENCOUNTER — Ambulatory Visit (INDEPENDENT_AMBULATORY_CARE_PROVIDER_SITE_OTHER): Payer: BC Managed Care – PPO

## 2020-11-04 DIAGNOSIS — I639 Cerebral infarction, unspecified: Secondary | ICD-10-CM

## 2020-11-04 LAB — CUP PACEART REMOTE DEVICE CHECK
Date Time Interrogation Session: 20220223034732
Implantable Pulse Generator Implant Date: 20200128

## 2020-11-05 ENCOUNTER — Ambulatory Visit: Payer: BC Managed Care – PPO | Admitting: Family Medicine

## 2020-11-06 ENCOUNTER — Other Ambulatory Visit: Payer: Self-pay | Admitting: Family Medicine

## 2020-11-12 DIAGNOSIS — L570 Actinic keratosis: Secondary | ICD-10-CM | POA: Diagnosis not present

## 2020-11-12 DIAGNOSIS — L578 Other skin changes due to chronic exposure to nonionizing radiation: Secondary | ICD-10-CM | POA: Diagnosis not present

## 2020-11-12 DIAGNOSIS — C44319 Basal cell carcinoma of skin of other parts of face: Secondary | ICD-10-CM | POA: Diagnosis not present

## 2020-11-12 DIAGNOSIS — L821 Other seborrheic keratosis: Secondary | ICD-10-CM | POA: Diagnosis not present

## 2020-11-12 NOTE — Progress Notes (Signed)
Carelink Summary Report / Loop Recorder 

## 2020-11-18 NOTE — Progress Notes (Signed)
No showed

## 2020-11-20 ENCOUNTER — Ambulatory Visit (INDEPENDENT_AMBULATORY_CARE_PROVIDER_SITE_OTHER): Payer: BC Managed Care – PPO | Admitting: Family Medicine

## 2020-11-20 DIAGNOSIS — K219 Gastro-esophageal reflux disease without esophagitis: Secondary | ICD-10-CM

## 2020-11-20 DIAGNOSIS — R7301 Impaired fasting glucose: Secondary | ICD-10-CM

## 2020-11-20 DIAGNOSIS — C44319 Basal cell carcinoma of skin of other parts of face: Secondary | ICD-10-CM | POA: Diagnosis not present

## 2020-11-20 DIAGNOSIS — E782 Mixed hyperlipidemia: Secondary | ICD-10-CM

## 2020-11-20 DIAGNOSIS — I119 Hypertensive heart disease without heart failure: Secondary | ICD-10-CM

## 2020-11-30 ENCOUNTER — Other Ambulatory Visit: Payer: Self-pay | Admitting: Family Medicine

## 2020-11-30 DIAGNOSIS — I119 Hypertensive heart disease without heart failure: Secondary | ICD-10-CM

## 2020-11-30 DIAGNOSIS — Z953 Presence of xenogenic heart valve: Secondary | ICD-10-CM

## 2020-12-07 ENCOUNTER — Ambulatory Visit (INDEPENDENT_AMBULATORY_CARE_PROVIDER_SITE_OTHER): Payer: BC Managed Care – PPO

## 2020-12-07 DIAGNOSIS — I639 Cerebral infarction, unspecified: Secondary | ICD-10-CM

## 2020-12-07 LAB — CUP PACEART REMOTE DEVICE CHECK
Date Time Interrogation Session: 20220328044830
Implantable Pulse Generator Implant Date: 20200128

## 2020-12-09 NOTE — Progress Notes (Signed)
Subjective:  Patient ID: Daniel Mann, male    DOB: March 02, 1951  Age: 70 y.o. MRN: [id number]  Chief Complaint  Patient presents with  . Hyperlipidemia  . Hypertension  . Gastroesophageal Reflux    HPI Mixed hyperlipidemia Repatha-maintains healthy diet, plans to start exercising Intolerant to statins.  Hypertensive heart disease without heart failure Lisinopril qd, Metoprolol bid. Eating healthy.   Gastroesophageal reflux disease without esophagitis Nexium. Helps with reflux  Prediabetes Currently controlled with diet.  Pt recently had ventral hernia repairs done about 6 weeks ago He is doing very well. He is back working.   He is lonely and depressed, but does not feel he needs any medicine.  Current Outpatient Medications on File Prior to Visit  Medication Sig Dispense Refill  . acetaminophen (TYLENOL) 325 MG tablet Take 2 tablets (650 mg total) by mouth every 6 (six) hours as needed for mild pain.    Marland Kitchen esomeprazole (NEXIUM) 40 MG capsule TAKE 1 CAPSULE BY MOUTH EVERY DAY 90 capsule 1  . lisinopril (ZESTRIL) 20 MG tablet TAKE 1 TABLET BY MOUTH EVERY DAY 90 tablet 0  . metoprolol tartrate (LOPRESSOR) 25 MG tablet TAKE 1 TABLET BY MOUTH TWICE A DAY 180 tablet 1  . Multiple Vitamin (MULTIVITAMIN) tablet Take 1 tablet by mouth daily.    Marland Kitchen REPATHA SURECLICK 140 MG/ML SOAJ INJECT 1 EACH INTO THE SKIN EVERY 14 (FOURTEEN) DAYS. *PA PENDING 2 mL 1  . Vitamin D, Ergocalciferol, (DRISDOL) 1.25 MG (50000 UNIT) CAPS capsule TAKE 1 CAPSULE(S) BY MOUTH TWICE A WEEK 24 capsule 2   No current facility-administered medications on file prior to visit.   Past Medical History:  Diagnosis Date  . Acute encephalopathy 07/20/2018  . Aortic stenosis   . CAD (coronary artery disease)   . CHF (congestive heart failure) (HCC)   . GERD (gastroesophageal reflux disease)   . Heart murmur   . Hernia, inguinal, right   . Hyperlipidemia   . Hypertension   . Impaired fasting glucose   . Mixed  hyperlipidemia   . Severe aortic stenosis 07/01/2018  . Umbilical hernia   . Vitamin D deficiency    Past Surgical History:  Procedure Laterality Date  . AORTIC VALVE REPLACEMENT N/A 07/12/2018   Procedure: AORTIC VALVE REPLACEMENT (AVR) using an Inspiris Valve size  23;  Surgeon: [name], MD;  Location: [facility];  Service: Open Heart Surgery;  Laterality: N/A;  . CATARACT EXTRACTION, BILATERAL    . COLON SURGERY  04/2020   Rt hemicolectomy with reanastomosis. ischemic bowel.  . COLONOSCOPY    . CORONARY ARTERY BYPASS GRAFT N/A 07/12/2018   Procedure: CORONARY ARTERY BYPASS GRAFTING (CABG) times three using left internal mammary artery and right greater saphenous vein harvested endoscopically.;  Surgeon: [name], MD;  Location: [facility];  Service: Open Heart Surgery;  Laterality: N/A;  . INSERTION OF MESH  10/02/2019   Procedure: INSERTION OF MESH;  Surgeon: Manus Rudd, MD;  Location: [facility];  Service: General;;  . LOOP RECORDER INSERTION N/A 10/09/2018   Procedure: LOOP RECORDER INSERTION;  Surgeon: Hillis Range, MD;  [facility];  Service: Cardiovascular;  Laterality: N/A;  . RIGHT/LEFT HEART CATH AND CORONARY ANGIOGRAPHY N/A 07/02/2018   Procedure: RIGHT/LEFT HEART CATH AND CORONARY ANGIOGRAPHY;  Surgeon: Lyn Records, MD;  [facility];  Service: Cardiovascular;  Laterality: N/A;  . TEE WITHOUT CARDIOVERSION N/A 07/12/2018   Procedure: TRANSESOPHAGEAL ECHOCARDIOGRAM (TEE);  Surgeon:  [name], MD;  Location: [facility] OR;  Service: Open Heart Surgery;  Laterality: N/A;  . UMBILICAL HERNIA REPAIR N/A 10/02/2019   Procedure: UMBILICAL HERNIA REPAIR WITH MESH;  Surgeon: Manus Rudd, MD;  Location: [facility];  Service: General;  Laterality: N/A;    Family History  Problem Relation Age of Onset  . Alzheimer's disease Mother   . Renal Disease Father   . Cancer - Prostate Father    Social History    Socioeconomic History  . Marital status: Widowed    Spouse name: Not on file  . Number of children: 2  . Years of education: Not on file  . Highest education level: Not on file  Occupational History    Comment: [employer] in [place]  Tobacco Use  . Smoking status: Former Games developer  . Smokeless tobacco: Never Used  Vaping Use  . Vaping Use: Never used  Substance and Sexual Activity  . Alcohol use: Yes    Comment: WINE occais  . Drug use: No  . Sexual activity: Not on file  Other Topics Concern  . Not on file  Social History Narrative  . Not on file   Social Determinants of Health   Financial Resource Strain: Not on file  Food Insecurity: Not on file  Transportation Needs: Not on file  Physical Activity: Not on file  Stress: Not on file  Social Connections: Not on file    Review of Systems  Constitutional: Negative for chills, diaphoresis, fatigue and fever.  HENT: Negative for congestion, ear pain and sore throat.   Respiratory: Negative for cough and shortness of breath.   Cardiovascular: Negative for chest pain and leg swelling.  Gastrointestinal: Negative for abdominal pain, constipation, diarrhea, nausea and vomiting.  Genitourinary: Negative for dysuria and urgency.  Musculoskeletal: Negative for arthralgias and myalgias.  Neurological: Negative for dizziness and headaches.  Psychiatric/Behavioral: Positive for sleep disturbance. Negative for dysphoric mood.     Objective:  BP 110/70   Pulse 64   Temp (!) 97.5 F (36.4 C)   Resp 16   Ht 6' (1.829 m)   Wt 237 lb (107.5 kg)   BMI 32.14 kg/m   BP/Weight 12/10/2020 10/27/2020 06/16/2020  Systolic BP 110 140 120  Diastolic BP 70 80 64  Wt. (Lbs) 237 234 215.2  BMI 32.14 31.74 29.19    Physical Exam Vitals reviewed.  Constitutional:      Appearance: Normal appearance. He is normal weight.  Cardiovascular:     Rate and Rhythm: Normal rate and regular rhythm.     Heart sounds: Murmur heard.     Pulmonary:     Effort: Pulmonary effort is normal.     Breath sounds: Normal breath sounds.  Abdominal:     General: Abdomen is flat. Bowel sounds are normal.     Palpations: Abdomen is soft.     Tenderness: There is no abdominal tenderness.  Neurological:     Mental Status: He is alert and oriented to person, place, and time.  Psychiatric:        Mood and Affect: Mood normal.        Behavior: Behavior normal.    Diabetic Foot Exam - Simple   No data filed      Lab Results  Component Value Date   WBC 9.5 06/16/2020   HGB 11.4 (L) 06/16/2020   HCT 34.0 (L) 06/16/2020   PLT 528 (H) 06/16/2020   GLUCOSE 110 (H) 06/16/2020   CHOL  177 06/16/2020   TRIG 102 06/16/2020   HDL 38 (L) 06/16/2020   LDLCALC 120 (H) 06/16/2020   ALT 21 06/16/2020   AST 21 06/16/2020   NA 141 06/16/2020   K 4.4 06/16/2020   CL 104 06/16/2020   CREATININE 0.68 (L) 06/16/2020   BUN 11 06/16/2020   CO2 24 06/16/2020   TSH 2.180 06/16/2020   INR 1.20 07/20/2018   HGBA1C 5.8 (H) 06/16/2020      Assessment & Plan:   1. Mixed hyperlipidemia Recommend continue to work on eating healthy diet and exercise. - Lipid panel  2. Hypertensive heart disease without heart failure Well controlled.  No changes to medicines.  Continue to work on eating a healthy diet and exercise.  Labs drawn today.  - Comprehensive metabolic panel  3. Gastroesophageal reflux disease without esophagitis The current medical regimen is effective;  continue present plan and medications.   4. Prediabetes Recommend continue to work on eating healthy diet and exercise. - Hemoglobin A1c - CBC with Differential/Platelet  5. Vitamin D deficiency - VITAMIN D 25 Hydroxy (Vit-D Deficiency, Fractures)  6. Other insomnia  prefers no medicine at this time.   No orders of the defined types were placed in this encounter.   Orders Placed This Encounter  Procedures  . Lipid panel  . Hemoglobin A1c  . Comprehensive  metabolic panel  . CBC with Differential/Platelet  . VITAMIN D 25 Hydroxy (Vit-D Deficiency, Fractures)     I,Katherina A Bramblett,acting as a scribe for Blane Ohara, MD.,have documented all relevant documentation on the behalf of Blane Ohara, MD,as directed by  Blane Ohara, MD while in the presence of Blane Ohara, MD.  Follow-up: Return in about 3 months (around 03/11/2021) for Fasting F/u.  An After Visit Summary was printed and given to the patient.  Blane Ohara, MD [facility] [phone]

## 2020-12-10 ENCOUNTER — Encounter: Payer: Self-pay | Admitting: Family Medicine

## 2020-12-10 ENCOUNTER — Ambulatory Visit (INDEPENDENT_AMBULATORY_CARE_PROVIDER_SITE_OTHER): Payer: BC Managed Care – PPO

## 2020-12-10 ENCOUNTER — Ambulatory Visit (INDEPENDENT_AMBULATORY_CARE_PROVIDER_SITE_OTHER): Payer: BC Managed Care – PPO | Admitting: Family Medicine

## 2020-12-10 ENCOUNTER — Other Ambulatory Visit: Payer: Self-pay

## 2020-12-10 VITALS — BP 110/70 | HR 64 | Temp 97.5°F | Resp 16 | Ht 72.0 in | Wt 237.0 lb

## 2020-12-10 DIAGNOSIS — K219 Gastro-esophageal reflux disease without esophagitis: Secondary | ICD-10-CM | POA: Diagnosis not present

## 2020-12-10 DIAGNOSIS — E559 Vitamin D deficiency, unspecified: Secondary | ICD-10-CM

## 2020-12-10 DIAGNOSIS — E782 Mixed hyperlipidemia: Secondary | ICD-10-CM | POA: Diagnosis not present

## 2020-12-10 DIAGNOSIS — R7303 Prediabetes: Secondary | ICD-10-CM | POA: Diagnosis not present

## 2020-12-10 DIAGNOSIS — I119 Hypertensive heart disease without heart failure: Secondary | ICD-10-CM

## 2020-12-10 DIAGNOSIS — G4709 Other insomnia: Secondary | ICD-10-CM

## 2020-12-10 DIAGNOSIS — Z23 Encounter for immunization: Secondary | ICD-10-CM

## 2020-12-10 NOTE — Progress Notes (Signed)
   Covid-19 Vaccination Clinic  Name:  Daniel Mann    MRN: [id number] DOB: [birth year]  12/10/2020  Mr. Ravelo was observed post Covid-19 immunization for 15 minutes without incident. He was provided with Vaccine Information Sheet and instruction to access the V-Safe system.   Mr. Feenstra was instructed to call 911 with any severe reactions post vaccine: Marland Kitchen Difficulty breathing  . Swelling of face and throat  . A fast heartbeat  . A bad rash all over body  . Dizziness and weakness   Immunizations Administered    Name Date Dose VIS Date Route   PFIZER Comrnaty(Gray TOP) Covid-19 Vaccine 12/10/2020 10:15 AM 0.3 mL 08/20/2020 Intramuscular   Manufacturer: [employer], [employer]   Lot: [id number]   NDC: [phone]

## 2020-12-11 LAB — LIPID PANEL
Chol/HDL Ratio: 2.2 ratio (ref 0.0–5.0)
Cholesterol, Total: 134 mg/dL (ref 100–199)
HDL: 61 mg/dL (ref >39)
LDL Chol Calc (NIH): 60 mg/dL (ref 0–99)
Triglycerides: 61 mg/dL (ref 0–149)
VLDL Cholesterol Cal: 13 mg/dL (ref 5–40)

## 2020-12-11 LAB — CBC WITH DIFFERENTIAL/PLATELET
Basophils Absolute: 0.1 10*3/uL (ref 0.0–0.2)
Basos: 1 %
EOS (ABSOLUTE): 0.3 10*3/uL (ref 0.0–0.4)
Eos: 3 %
Hematocrit: 42.1 % (ref 37.5–51.0)
Hemoglobin: 14.4 g/dL (ref 13.0–17.7)
Immature Grans (Abs): 0 10*3/uL (ref 0.0–0.1)
Immature Granulocytes: 0 %
Lymphocytes Absolute: 1.9 10*3/uL (ref 0.7–3.1)
Lymphs: 18 %
MCH: 31.2 pg (ref 26.6–33.0)
MCHC: 34.2 g/dL (ref 31.5–35.7)
MCV: 91 fL (ref 79–97)
Monocytes Absolute: 0.9 10*3/uL (ref 0.1–0.9)
Monocytes: 9 %
Neutrophils Absolute: 7.2 10*3/uL — ABNORMAL HIGH (ref 1.4–7.0)
Neutrophils: 69 %
Platelets: 331 10*3/uL (ref 150–450)
RBC: 4.61 x10E6/uL (ref 4.14–5.80)
RDW: 13.2 % (ref 11.6–15.4)
WBC: 10.3 10*3/uL (ref 3.4–10.8)

## 2020-12-11 LAB — COMPREHENSIVE METABOLIC PANEL
ALT: 23 IU/L (ref 0–44)
AST: 22 IU/L (ref 0–40)
Albumin/Globulin Ratio: 2 (ref 1.2–2.2)
Albumin: 4.4 g/dL (ref 3.8–4.8)
Alkaline Phosphatase: 89 IU/L (ref 44–121)
BUN/Creatinine Ratio: 20 (ref 10–24)
BUN: 23 mg/dL (ref 8–27)
Bilirubin Total: 0.4 mg/dL (ref 0.0–1.2)
CO2: 23 mmol/L (ref 20–29)
Calcium: 10.3 mg/dL — ABNORMAL HIGH (ref 8.6–10.2)
Chloride: 99 mmol/L (ref 96–106)
Creatinine, Ser: 1.16 mg/dL (ref 0.76–1.27)
Globulin, Total: 2.2 g/dL (ref 1.5–4.5)
Glucose: 104 mg/dL — ABNORMAL HIGH (ref 65–99)
Potassium: 5.4 mmol/L — ABNORMAL HIGH (ref 3.5–5.2)
Sodium: 136 mmol/L (ref 134–144)
Total Protein: 6.6 g/dL (ref 6.0–8.5)
eGFR: 68 mL/min/{1.73_m2} (ref >59)

## 2020-12-11 LAB — VITAMIN D 25 HYDROXY (VIT D DEFICIENCY, FRACTURES): Vit D, 25-Hydroxy: 60.1 ng/mL (ref 30.0–100.0)

## 2020-12-11 LAB — CARDIOVASCULAR RISK ASSESSMENT

## 2020-12-11 LAB — HEMOGLOBIN A1C
Est. average glucose Bld gHb Est-mCnc: 114 mg/dL
Hgb A1c MFr Bld: 5.6 % (ref 4.8–5.6)

## 2020-12-17 NOTE — Progress Notes (Signed)
Carelink Summary Report / Loop Recorder 

## 2020-12-28 ENCOUNTER — Encounter: Payer: Self-pay | Admitting: Family Medicine

## 2021-01-11 ENCOUNTER — Ambulatory Visit (INDEPENDENT_AMBULATORY_CARE_PROVIDER_SITE_OTHER): Payer: BC Managed Care – PPO

## 2021-01-11 DIAGNOSIS — I639 Cerebral infarction, unspecified: Secondary | ICD-10-CM | POA: Diagnosis not present

## 2021-01-12 LAB — CUP PACEART REMOTE DEVICE CHECK
Date Time Interrogation Session: 20220430044920
Implantable Pulse Generator Implant Date: 20200128

## 2021-01-18 ENCOUNTER — Other Ambulatory Visit: Payer: Self-pay

## 2021-01-18 ENCOUNTER — Telehealth: Payer: Self-pay

## 2021-01-18 DIAGNOSIS — I119 Hypertensive heart disease without heart failure: Secondary | ICD-10-CM

## 2021-01-18 DIAGNOSIS — Z953 Presence of xenogenic heart valve: Secondary | ICD-10-CM

## 2021-01-18 NOTE — Telephone Encounter (Signed)
PA submitted and denied for Repatha via covermymeds. Per patients insurance: "Per your health plan's criteria, this drug is covered if you meet the following: (1) Medical records are sent in (for example: chart notes, laboratory values) showing your bad cholesterol [low-density lipoprotein cholesterol (LDL-C)] decreases while on requested therapy. The information provided does not show that you meet the criteria listed above. Please speak with your doctor about your choices"

## 2021-01-21 NOTE — Telephone Encounter (Signed)
Pt calling he would like this to be reviewed. He wants to be able to stay on this medication due to cholesterol decreasing. He would like insurance to pay if possible but if not he will pay out of pocket due to no reaction and decreased cholesterol. Please advise.   Lorita Officer, [place] 01/21/21 1:57 PM

## 2021-01-22 NOTE — Telephone Encounter (Signed)
APPEALS Can I appeal this decision? Yes. You, your provider, or an appointed representative like an attorney or family member can file a first-level appeal within 180 calendar days from the date of this decision. Otherwise this decision will be final. To appeal, send written comments, documents or other information to be considered to: [facility]/o [employer] [address] [address] [place], [place] [address] Phone: [phone] Fax: [phone]

## 2021-02-01 ENCOUNTER — Other Ambulatory Visit (INDEPENDENT_AMBULATORY_CARE_PROVIDER_SITE_OTHER): Payer: BC Managed Care – PPO | Admitting: Family Medicine

## 2021-02-01 DIAGNOSIS — I2510 Atherosclerotic heart disease of native coronary artery without angina pectoris: Secondary | ICD-10-CM

## 2021-02-01 NOTE — Progress Notes (Signed)
Carelink Summary Report / Loop Recorder 

## 2021-02-01 NOTE — Telephone Encounter (Signed)
Patient informed. 

## 2021-02-01 NOTE — Progress Notes (Signed)
    Feb 01, 2021  Re: Daniel Mann  DOB 07-26-1951 [address] [place], [place] [address]  To: [facility] c/o Appeals Coordinator [address] [address] [place], [place] [address] Phone: [phone] Fax: [phone]  Re: Appeal for Repatha  I have included lab work/tests to support the above appeal.  The patient has failed on the following medicines: simvastatin, atorvastatin, and rosuvastatin because they caused elevation of Liver function tests and muscle pain. He tolerated Livalo, but insurance would not cover it. I have given him Repatha and his LDL has dropped from 169 to 60. Patient has known CAD s/p CABG.   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  If you have any questions, please do not hesitate to call.       Blane Ohara, MD

## 2021-02-01 NOTE — Telephone Encounter (Signed)
Please let him know that I sent an appeal for repatha.

## 2021-02-03 ENCOUNTER — Telehealth: Payer: Self-pay

## 2021-02-03 NOTE — Telephone Encounter (Signed)
Patient's PA for Repatha Sure inj 140 mg was approved through 01/11/2021 - 02/02/2022.

## 2021-02-14 LAB — CUP PACEART REMOTE DEVICE CHECK
Date Time Interrogation Session: 20220602045222
Implantable Pulse Generator Implant Date: 20200128

## 2021-02-15 ENCOUNTER — Ambulatory Visit (INDEPENDENT_AMBULATORY_CARE_PROVIDER_SITE_OTHER): Payer: BC Managed Care – PPO

## 2021-02-15 DIAGNOSIS — I639 Cerebral infarction, unspecified: Secondary | ICD-10-CM | POA: Diagnosis not present

## 2021-02-18 ENCOUNTER — Other Ambulatory Visit: Payer: Self-pay | Admitting: Family Medicine

## 2021-02-20 ENCOUNTER — Other Ambulatory Visit: Payer: Self-pay | Admitting: Family Medicine

## 2021-02-20 DIAGNOSIS — I119 Hypertensive heart disease without heart failure: Secondary | ICD-10-CM

## 2021-02-24 ENCOUNTER — Other Ambulatory Visit: Payer: Self-pay | Admitting: Family Medicine

## 2021-02-24 DIAGNOSIS — I119 Hypertensive heart disease without heart failure: Secondary | ICD-10-CM

## 2021-02-24 DIAGNOSIS — Z953 Presence of xenogenic heart valve: Secondary | ICD-10-CM

## 2021-03-09 NOTE — Progress Notes (Signed)
Carelink Summary Report / Loop Recorder 

## 2021-03-18 ENCOUNTER — Ambulatory Visit: Payer: BC Managed Care – PPO | Admitting: Family Medicine

## 2021-03-22 ENCOUNTER — Ambulatory Visit (INDEPENDENT_AMBULATORY_CARE_PROVIDER_SITE_OTHER): Payer: BC Managed Care – PPO

## 2021-03-22 DIAGNOSIS — I639 Cerebral infarction, unspecified: Secondary | ICD-10-CM

## 2021-03-22 LAB — CUP PACEART REMOTE DEVICE CHECK
Date Time Interrogation Session: 20220705045458
Implantable Pulse Generator Implant Date: 20200128

## 2021-04-12 NOTE — Progress Notes (Signed)
Carelink Summary Report / Loop Recorder 

## 2021-04-22 ENCOUNTER — Other Ambulatory Visit: Payer: Self-pay

## 2021-04-22 ENCOUNTER — Ambulatory Visit (INDEPENDENT_AMBULATORY_CARE_PROVIDER_SITE_OTHER): Payer: BC Managed Care – PPO | Admitting: Family Medicine

## 2021-04-22 VITALS — BP 110/70 | HR 50 | Temp 96.4°F | Ht 72.0 in | Wt 243.0 lb

## 2021-04-22 DIAGNOSIS — T466X5A Adverse effect of antihyperlipidemic and antiarteriosclerotic drugs, initial encounter: Secondary | ICD-10-CM

## 2021-04-22 DIAGNOSIS — I2510 Atherosclerotic heart disease of native coronary artery without angina pectoris: Secondary | ICD-10-CM | POA: Diagnosis not present

## 2021-04-22 DIAGNOSIS — I119 Hypertensive heart disease without heart failure: Secondary | ICD-10-CM | POA: Diagnosis not present

## 2021-04-22 DIAGNOSIS — R7303 Prediabetes: Secondary | ICD-10-CM | POA: Diagnosis not present

## 2021-04-22 DIAGNOSIS — E782 Mixed hyperlipidemia: Secondary | ICD-10-CM

## 2021-04-22 DIAGNOSIS — Z6832 Body mass index (BMI) 32.0-32.9, adult: Secondary | ICD-10-CM

## 2021-04-22 DIAGNOSIS — M791 Myalgia, unspecified site: Secondary | ICD-10-CM

## 2021-04-22 DIAGNOSIS — Z953 Presence of xenogenic heart valve: Secondary | ICD-10-CM

## 2021-04-22 DIAGNOSIS — I700 Atherosclerosis of aorta: Secondary | ICD-10-CM

## 2021-04-22 DIAGNOSIS — K219 Gastro-esophageal reflux disease without esophagitis: Secondary | ICD-10-CM

## 2021-04-22 DIAGNOSIS — E6609 Other obesity due to excess calories: Secondary | ICD-10-CM

## 2021-04-22 MED ORDER — ESOMEPRAZOLE MAGNESIUM 40 MG PO CPDR: DELAYED_RELEASE_CAPSULE | ORAL | 3 refills | Status: DC

## 2021-04-22 MED ORDER — LISINOPRIL 20 MG PO TABS: 20.0000 mg | ORAL_TABLET | Freq: Every day | ORAL | 3 refills | Status: DC

## 2021-04-22 MED ORDER — VITAMIN D (ERGOCALCIFEROL) 1.25 MG (50000 UNIT) PO CAPS
ORAL_CAPSULE | ORAL | 3 refills | Status: DC
Start: 2021-04-22 — End: 2022-03-16

## 2021-04-22 MED ORDER — METOPROLOL TARTRATE 25 MG PO TABS: 25.0000 mg | ORAL_TABLET | Freq: Two times a day (BID) | ORAL | 3 refills | Status: DC

## 2021-04-22 NOTE — Patient Instructions (Addendum)
Check on Shingles [facility] status at [facility].  Need high dose flu shot in September.  Call cardiology when due for follow up.

## 2021-04-22 NOTE — Progress Notes (Signed)
Subjective:  Patient ID: Daniel Mann, male    DOB: [birth year]  Age: 70 y.o. MRN: [id number]  Chief Complaint  Patient presents with   Hypertension   Gastroesophageal Reflux    HPI Prediabetes:  Most recent A1C: 5.6 (12/10/2020) Lifestyle changes: Eats healthy. Is very active in his job.   Hyperlipidemia: Current medications: on repatha. Tolerating it well. Intolerant to statins. Pt has ASCVD.   GERD: well controlled with nexium.   Hypertensive heart disease: Complications: cardiac, see below.  Current medications:metoprolol tartrate 25 mg one twice a day, lisinopril 20 mg once daily.  Lifestyle changes:  CAD/AORTIC STENOSIS: TREATED WITH CABG AND VALVE REPLACEMENT IN 06/2018. Op note as follows:  Preoperative Diagnosis:  Severe multi-vessel coronary artery disease and severe aortic stenosis.  Postoperative Diagnosis:  Same Procedure: Median Sternotomy Extracorporeal circulation 3.   Coronary artery bypass grafting x 3 Left internal mammary graft to the LAD SVG to OM2 SVG to RCA 4.   Endoscopic vein harvest from the right leg 5.   Aortic valve replacement with a 23 mm Edwards INSPIRIS RESILIA pericardial valve.  Current Outpatient Medications on File Prior to Visit  Medication Sig Dispense Refill   acetaminophen (TYLENOL) 325 MG tablet Take 2 tablets (650 mg total) by mouth every 6 (six) hours as needed for mild pain.     Multiple Vitamin (MULTIVITAMIN) tablet Take 1 tablet by mouth daily.     REPATHA SURECLICK 140 MG/ML SOAJ INJECT 1 EACH INTO THE SKIN EVERY 14 (FOURTEEN) DAYS. *PA PENDING 6 mL 1   No current facility-administered medications on file prior to visit.   Past Medical History:  Diagnosis Date   Acute encephalopathy 07/20/2018   Aortic stenosis    CAD (coronary artery disease)    CHF (congestive heart failure) (HCC)    GERD (gastroesophageal reflux disease)    Heart murmur    Hernia, inguinal, right    Hyperlipidemia    Hypertension    Impaired  fasting glucose    Mixed hyperlipidemia    Severe aortic stenosis 07/01/2018   Umbilical hernia    Vitamin D deficiency    Past Surgical History:  Procedure Laterality Date   AORTIC VALVE REPLACEMENT N/A 07/12/2018   Procedure: AORTIC VALVE REPLACEMENT (AVR) using an Inspiris Valve size  23;  Surgeon: Alleen BorneBartle, Bryan K, MD;  Location: [facility];  Service: Open Heart Surgery;  Laterality: N/A;   CATARACT EXTRACTION, BILATERAL     COLON SURGERY  04/2020   Rt hemicolectomy with reanastomosis. ischemic bowel.   COLONOSCOPY     CORONARY ARTERY BYPASS GRAFT N/A 07/12/2018   Procedure: CORONARY ARTERY BYPASS GRAFTING (CABG) times three using left internal mammary artery and right greater saphenous vein harvested endoscopically.;  Surgeon: Alleen BorneBartle, Bryan K, MD;  Location: [facility];  Service: Open Heart Surgery;  Laterality: N/A;   INSERTION OF MESH  10/02/2019   Procedure: INSERTION OF MESH;  Surgeon: Manus Ruddsuei, Matthew, MD;  Location: [facility];  Service: General;;   LOOP RECORDER INSERTION N/A 10/09/2018   Procedure: LOOP RECORDER INSERTION;  Surgeon: Hillis RangeAllred, James, MD;  [facility];  Service: Cardiovascular;  Laterality: N/A;   RIGHT/LEFT HEART CATH AND CORONARY ANGIOGRAPHY N/A 07/02/2018   Procedure: RIGHT/LEFT HEART CATH AND CORONARY ANGIOGRAPHY;  Surgeon: Lyn RecordsSmith, Henry W, MD;  [facility];  Service: Cardiovascular;  Laterality: N/A;   TEE WITHOUT CARDIOVERSION N/A 07/12/2018   Procedure: TRANSESOPHAGEAL ECHOCARDIOGRAM (TEE);  Surgeon: Alleen BorneBartle, Bryan K, MD;  Location: [facility];  Service: Open Heart Surgery;  Laterality: N/A;   UMBILICAL HERNIA REPAIR N/A 10/02/2019   Procedure: UMBILICAL HERNIA REPAIR WITH MESH;  Surgeon: Manus Rudd, MD;  Location: [facility];  Service: General;  Laterality: N/A;    Family History  Problem Relation Age of Onset   Alzheimer's disease Mother    Renal Disease Father    Cancer - Prostate Father    Social  History   Socioeconomic History   Marital status: Widowed    Spouse name: Not on file   Number of children: 2   Years of education: Not on file   Highest education level: Not on file  Occupational History    Comment: [employer] in [place]  Tobacco Use   Smoking status: Former   Smokeless tobacco: Never  Building services engineer Use: Never used  Substance and Sexual Activity   Alcohol use: Yes    Comment: WINE occais   Drug use: No   Sexual activity: Not on file  Other Topics Concern   Not on file  Social History Narrative   Not on file   Social Determinants of Health   Financial Resource Strain: Not on file  Food Insecurity: Not on file  Transportation Needs: Not on file  Physical Activity: Not on file  Stress: Not on file  Social Connections: Not on file    Review of Systems  Constitutional:  Negative for chills, fatigue and fever.  HENT:  Negative for congestion, ear pain and sore throat.   Respiratory:  Negative for cough and shortness of breath.   Cardiovascular:  Negative for chest pain.  Gastrointestinal:  Negative for abdominal pain, constipation, diarrhea, nausea and vomiting.  Endocrine: Negative for polydipsia, polyphagia and polyuria.  Genitourinary:  Negative for dysuria and frequency.  Musculoskeletal:  Negative for arthralgias and myalgias.  Neurological:  Negative for dizziness and headaches.  Psychiatric/Behavioral:  Negative for dysphoric mood.        No dysphoria    Objective:  BP 110/70   Pulse (!) 50   Temp (!) 96.4 F (35.8 C)   Ht 6' (1.829 m)   Wt 243 lb (110.2 kg)   SpO2 99%   BMI 32.96 kg/m   BP/Weight 04/22/2021 12/10/2020 10/27/2020  Systolic BP 110 110 140  Diastolic BP 70 70 80  Wt. (Lbs) 243 237 234  BMI 32.96 32.14 31.74    Physical Exam Vitals reviewed.  Constitutional:      Appearance: Normal appearance.  HENT:     Right Ear: Tympanic membrane, ear canal and external ear normal.     Left Ear: Tympanic membrane, ear  canal and external ear normal.     Nose: Nose normal. No congestion or rhinorrhea.     Mouth/Throat:     Mouth: Mucous membranes are moist.     Pharynx: No oropharyngeal exudate or posterior oropharyngeal erythema.  Neck:     Vascular: No carotid bruit.  Cardiovascular:     Rate and Rhythm: Normal rate and regular rhythm.     Pulses: Normal pulses.     Heart sounds: Murmur heard.  Pulmonary:     Effort: Pulmonary effort is normal. No respiratory distress.     Breath sounds: Normal breath sounds. No wheezing, rhonchi or rales.  Abdominal:     General: Bowel sounds are normal.     Palpations: Abdomen is soft.     Tenderness: There is no abdominal tenderness.  Musculoskeletal:  Right lower leg: No edema.     Left lower leg: No edema.  Lymphadenopathy:     Cervical: No cervical adenopathy.  Neurological:     Mental Status: He is alert and oriented to person, place, and time.  Psychiatric:        Mood and Affect: Mood normal.        Behavior: Behavior normal.    Diabetic Foot Exam - Simple   No data filed      Lab Results  Component Value Date   WBC 7.6 04/22/2021   HGB 14.6 04/22/2021   HCT 44.8 04/22/2021   PLT 301 04/22/2021   GLUCOSE 102 (H) 04/22/2021   CHOL 116 04/22/2021   TRIG 66 04/22/2021   HDL 60 04/22/2021   LDLCALC 42 04/22/2021   ALT 26 04/22/2021   AST 25 04/22/2021   NA 138 04/22/2021   K 5.1 04/22/2021   CL 102 04/22/2021   CREATININE 0.80 04/22/2021   BUN 11 04/22/2021   CO2 24 04/22/2021   TSH 2.180 06/16/2020   INR 1.20 07/20/2018   HGBA1C 6.0 (H) 04/22/2021      Assessment & Plan:   1. Hypertensive heart disease without heart failure Well controlled.  No changes to medicines.  Continue to work on eating a healthy diet and exercise.  Labs drawn today.  - lisinopril (ZESTRIL) 20 MG tablet; Take 1 tablet (20 mg total) by mouth daily.  Dispense: 90 tablet; Refill: 3 - metoprolol tartrate (LOPRESSOR) 25 MG tablet; Take 1 tablet (25 mg  total) by mouth 2 (two) times daily.  Dispense: 180 tablet; Refill: 3 - CBC with Differential/Platelet - Comprehensive metabolic panel  2. Mixed hyperlipidemia Well controlled.  No changes to medicines.  Continue to work on eating a healthy diet and exercise.  Labs drawn today.  - lipid panel  3. Coronary artery disease involving native coronary artery of native heart without angina pectoris Recommend continue to work on eating healthy diet and exercise. Continue repatha.  4. Prediabetes Recommend continue to work on eating healthy diet and exercise. Consider addition of GLP1 or SGL2 for cardio protection. - Hemoglobin A1c  5. S/P aortic valve replacement with bioprosthetic valve Keep cardiology follow ups.   6. Atherosclerosis of aorta (HCC) Well controlled.  No changes to medicines.  Continue to work on eating a healthy diet and exercise.  Labs drawn today.  - Lipid panel  7. Gastroesophageal reflux disease without esophagitis  The current medical regimen is effective;  continue present plan and medications.  8. Myalgia due to statin (also had issues with increasing LFTs with statins.) Meds ordered this encounter  Medications   esomeprazole (NEXIUM) 40 MG capsule    Sig: TAKE 1 CAPSULE BY MOUTH EVERY DAY    Dispense:  90 capsule    Refill:  3   lisinopril (ZESTRIL) 20 MG tablet    Sig: Take 1 tablet (20 mg total) by mouth daily.    Dispense:  90 tablet    Refill:  3   metoprolol tartrate (LOPRESSOR) 25 MG tablet    Sig: Take 1 tablet (25 mg total) by mouth 2 (two) times daily.    Dispense:  180 tablet    Refill:  3   Vitamin D, Ergocalciferol, (DRISDOL) 1.25 MG (50000 UNIT) CAPS capsule    Sig: TAKE 1 CAPSULE(S) BY MOUTH TWICE A WEEK    Dispense:  24 capsule    Refill:  3    Orders Placed This Encounter  Procedures   CBC with Differential/Platelet   Comprehensive metabolic panel   Hemoglobin A1c   Lipid panel   Cardiovascular Risk Assessment      Follow-up: Return in about 6 months (around 10/23/2021) for fasting.  An After Visit Summary was printed and given to the patient.  Daniel Ohara, MD [facility] [phone]

## 2021-04-23 LAB — CBC WITH DIFFERENTIAL/PLATELET
Basophils Absolute: 0.1 10*3/uL (ref 0.0–0.2)
Basos: 1 %
EOS (ABSOLUTE): 0.2 10*3/uL (ref 0.0–0.4)
Eos: 2 %
Hematocrit: 44.8 % (ref 37.5–51.0)
Hemoglobin: 14.6 g/dL (ref 13.0–17.7)
Immature Grans (Abs): 0 10*3/uL (ref 0.0–0.1)
Immature Granulocytes: 0 %
Lymphocytes Absolute: 1.7 10*3/uL (ref 0.7–3.1)
Lymphs: 22 %
MCH: 29.8 pg (ref 26.6–33.0)
MCHC: 32.6 g/dL (ref 31.5–35.7)
MCV: 91 fL (ref 79–97)
Monocytes Absolute: 0.8 10*3/uL (ref 0.1–0.9)
Monocytes: 11 %
Neutrophils Absolute: 4.9 10*3/uL (ref 1.4–7.0)
Neutrophils: 64 %
Platelets: 301 10*3/uL (ref 150–450)
RBC: 4.9 x10E6/uL (ref 4.14–5.80)
RDW: 12.6 % (ref 11.6–15.4)
WBC: 7.6 10*3/uL (ref 3.4–10.8)

## 2021-04-23 LAB — COMPREHENSIVE METABOLIC PANEL
ALT: 26 IU/L (ref 0–44)
AST: 25 IU/L (ref 0–40)
Albumin/Globulin Ratio: 1.9 (ref 1.2–2.2)
Albumin: 4.4 g/dL (ref 3.8–4.8)
Alkaline Phosphatase: 95 IU/L (ref 44–121)
BUN/Creatinine Ratio: 14 (ref 10–24)
BUN: 11 mg/dL (ref 8–27)
Bilirubin Total: 0.2 mg/dL (ref 0.0–1.2)
CO2: 24 mmol/L (ref 20–29)
Calcium: 10.3 mg/dL — ABNORMAL HIGH (ref 8.6–10.2)
Chloride: 102 mmol/L (ref 96–106)
Creatinine, Ser: 0.8 mg/dL (ref 0.76–1.27)
Globulin, Total: 2.3 g/dL (ref 1.5–4.5)
Glucose: 102 mg/dL — ABNORMAL HIGH (ref 65–99)
Potassium: 5.1 mmol/L (ref 3.5–5.2)
Sodium: 138 mmol/L (ref 134–144)
Total Protein: 6.7 g/dL (ref 6.0–8.5)
eGFR: 95 mL/min/{1.73_m2} (ref >59)

## 2021-04-23 LAB — HEMOGLOBIN A1C
Est. average glucose Bld gHb Est-mCnc: 126 mg/dL
Hgb A1c MFr Bld: 6 % — ABNORMAL HIGH (ref 4.8–5.6)

## 2021-04-23 LAB — LIPID PANEL
Chol/HDL Ratio: 1.9 ratio (ref 0.0–5.0)
Cholesterol, Total: 116 mg/dL (ref 100–199)
HDL: 60 mg/dL (ref >39)
LDL Chol Calc (NIH): 42 mg/dL (ref 0–99)
Triglycerides: 66 mg/dL (ref 0–149)
VLDL Cholesterol Cal: 14 mg/dL (ref 5–40)

## 2021-04-23 LAB — CARDIOVASCULAR RISK ASSESSMENT

## 2021-04-26 ENCOUNTER — Ambulatory Visit (INDEPENDENT_AMBULATORY_CARE_PROVIDER_SITE_OTHER): Payer: BC Managed Care – PPO

## 2021-04-26 DIAGNOSIS — I639 Cerebral infarction, unspecified: Secondary | ICD-10-CM | POA: Diagnosis not present

## 2021-04-27 LAB — CUP PACEART REMOTE DEVICE CHECK
Date Time Interrogation Session: 20220814231125
Implantable Pulse Generator Implant Date: 20200128

## 2021-05-02 ENCOUNTER — Encounter: Payer: Self-pay | Admitting: Family Medicine

## 2021-05-03 ENCOUNTER — Telehealth: Payer: Self-pay

## 2021-05-14 NOTE — Progress Notes (Signed)
Carelink Summary Report / Loop Recorder 

## 2021-05-27 ENCOUNTER — Ambulatory Visit: Payer: BC Managed Care – PPO | Admitting: Family Medicine

## 2021-05-31 ENCOUNTER — Ambulatory Visit (INDEPENDENT_AMBULATORY_CARE_PROVIDER_SITE_OTHER): Payer: BC Managed Care – PPO

## 2021-05-31 DIAGNOSIS — I639 Cerebral infarction, unspecified: Secondary | ICD-10-CM | POA: Diagnosis not present

## 2021-06-02 LAB — CUP PACEART REMOTE DEVICE CHECK
Date Time Interrogation Session: 20220916231003
Implantable Pulse Generator Implant Date: 20200128

## 2021-06-07 NOTE — Progress Notes (Signed)
Carelink Summary Report / Loop Recorder 

## 2021-06-11 DIAGNOSIS — L853 Xerosis cutis: Secondary | ICD-10-CM | POA: Diagnosis not present

## 2021-06-11 DIAGNOSIS — L300 Nummular dermatitis: Secondary | ICD-10-CM | POA: Diagnosis not present

## 2021-06-11 DIAGNOSIS — L570 Actinic keratosis: Secondary | ICD-10-CM | POA: Diagnosis not present

## 2021-06-11 DIAGNOSIS — L299 Pruritus, unspecified: Secondary | ICD-10-CM | POA: Diagnosis not present

## 2021-07-05 ENCOUNTER — Ambulatory Visit (INDEPENDENT_AMBULATORY_CARE_PROVIDER_SITE_OTHER): Payer: BC Managed Care – PPO

## 2021-07-05 DIAGNOSIS — I639 Cerebral infarction, unspecified: Secondary | ICD-10-CM

## 2021-07-05 LAB — CUP PACEART REMOTE DEVICE CHECK
Date Time Interrogation Session: 20221019231429
Implantable Pulse Generator Implant Date: 20200128

## 2021-07-13 NOTE — Progress Notes (Signed)
Carelink Summary Report / Loop Recorder 

## 2021-08-03 ENCOUNTER — Other Ambulatory Visit: Payer: Self-pay | Admitting: Legal Medicine

## 2021-08-03 DIAGNOSIS — Z953 Presence of xenogenic heart valve: Secondary | ICD-10-CM

## 2021-08-03 DIAGNOSIS — I119 Hypertensive heart disease without heart failure: Secondary | ICD-10-CM

## 2021-08-09 ENCOUNTER — Ambulatory Visit (INDEPENDENT_AMBULATORY_CARE_PROVIDER_SITE_OTHER): Payer: BC Managed Care – PPO

## 2021-08-09 DIAGNOSIS — I639 Cerebral infarction, unspecified: Secondary | ICD-10-CM

## 2021-08-10 LAB — CUP PACEART REMOTE DEVICE CHECK
Date Time Interrogation Session: 20221121221445
Implantable Pulse Generator Implant Date: 20200128

## 2021-08-17 NOTE — Progress Notes (Signed)
Carelink Summary Report / Loop Recorder 

## 2021-08-18 DIAGNOSIS — N411 Chronic prostatitis: Secondary | ICD-10-CM | POA: Diagnosis not present

## 2021-08-18 DIAGNOSIS — N401 Enlarged prostate with lower urinary tract symptoms: Secondary | ICD-10-CM | POA: Diagnosis not present

## 2021-08-18 DIAGNOSIS — R351 Nocturia: Secondary | ICD-10-CM | POA: Diagnosis not present

## 2021-09-07 ENCOUNTER — Ambulatory Visit (INDEPENDENT_AMBULATORY_CARE_PROVIDER_SITE_OTHER): Payer: Self-pay

## 2021-09-07 DIAGNOSIS — I639 Cerebral infarction, unspecified: Secondary | ICD-10-CM

## 2021-09-07 LAB — CUP PACEART REMOTE DEVICE CHECK
Date Time Interrogation Session: 20221224221605
Implantable Pulse Generator Implant Date: 20200128

## 2021-09-14 NOTE — Progress Notes (Signed)
Carelink Summary Report / Loop Recorder 

## 2021-09-24 NOTE — Addendum Note (Signed)
Addended by: Geralyn Flash D on: 09/24/2021 03:34 PM   Modules accepted: Level of Service

## 2021-10-05 DIAGNOSIS — L821 Other seborrheic keratosis: Secondary | ICD-10-CM | POA: Diagnosis not present

## 2021-10-05 DIAGNOSIS — L570 Actinic keratosis: Secondary | ICD-10-CM | POA: Diagnosis not present

## 2021-10-05 DIAGNOSIS — L578 Other skin changes due to chronic exposure to nonionizing radiation: Secondary | ICD-10-CM | POA: Diagnosis not present

## 2021-10-05 DIAGNOSIS — C4441 Basal cell carcinoma of skin of scalp and neck: Secondary | ICD-10-CM | POA: Diagnosis not present

## 2021-10-05 DIAGNOSIS — L814 Other melanin hyperpigmentation: Secondary | ICD-10-CM | POA: Diagnosis not present

## 2021-10-07 ENCOUNTER — Ambulatory Visit: Payer: BC Managed Care – PPO

## 2021-10-08 LAB — CUP PACEART REMOTE DEVICE CHECK
Date Time Interrogation Session: 20230126221911
Implantable Pulse Generator Implant Date: 20200128

## 2021-10-14 DIAGNOSIS — C4442 Squamous cell carcinoma of skin of scalp and neck: Secondary | ICD-10-CM | POA: Diagnosis not present

## 2021-10-29 ENCOUNTER — Ambulatory Visit: Payer: BC Managed Care – PPO | Admitting: Family Medicine

## 2021-11-09 ENCOUNTER — Ambulatory Visit (INDEPENDENT_AMBULATORY_CARE_PROVIDER_SITE_OTHER): Payer: BC Managed Care – PPO

## 2021-11-09 DIAGNOSIS — I639 Cerebral infarction, unspecified: Secondary | ICD-10-CM | POA: Diagnosis not present

## 2021-11-11 LAB — CUP PACEART REMOTE DEVICE CHECK
Date Time Interrogation Session: 20230301020750
Implantable Pulse Generator Implant Date: 20200128

## 2021-11-17 NOTE — Progress Notes (Signed)
Carelink Summary Report / Loop Recorder 

## 2021-11-29 NOTE — Progress Notes (Signed)
? ?Subjective:  ?Patient ID: Daniel Mann, male    DOB: 03-Oct-1950  Age: 71 y.o. MRN: [id number] ? ?Chief Complaint  ?Patient presents with  ? Hyperlipidemia  ? Hypertension  ? ?HPI: ?Hyperlipidemia/CAD:  ?Patient is currently taking Repatha injection once every two weeks. Intolerant to statins. ? ?Hypertensive heart disease without heart failure: ?Patient is currently taking Aspirin 325 mg once daily, Metoprolol tartrate 25 mg take 1 tablet twice daily, Lisinopril 20 mg take 1 tablet daily. ? ?Prediabetes: ?Last A1c: 6.0 ? ?GERD: Patient is currently taking Nexium 40 mg 1 capsule once daily. ? ?Eating healthy.  ?Walk all day at work.  ? ?Current Outpatient Medications on File Prior to Visit  ?Medication Sig Dispense Refill  ? acetaminophen (TYLENOL) 325 MG tablet Take 2 tablets (650 mg total) by mouth every 6 (six) hours as needed for mild pain.    ? aspirin 325 MG tablet Take 325 mg by mouth daily.    ? esomeprazole (NEXIUM) 40 MG capsule TAKE 1 CAPSULE BY MOUTH EVERY DAY 90 capsule 3  ? lisinopril (ZESTRIL) 20 MG tablet Take 1 tablet (20 mg total) by mouth daily. 90 tablet 3  ? metoprolol tartrate (LOPRESSOR) 25 MG tablet Take 1 tablet (25 mg total) by mouth 2 (two) times daily. 180 tablet 3  ? Multiple Vitamin (MULTIVITAMIN) tablet Take 1 tablet by mouth daily.    ? REPATHA SURECLICK 140 MG/ML SOAJ INJECT 1 EACH INTO THE SKIN EVERY 14 (FOURTEEN) DAYS. *PA PENDING 6 mL 1  ? Vitamin D, Ergocalciferol, (DRISDOL) 1.25 MG (50000 UNIT) CAPS capsule TAKE 1 CAPSULE(S) BY MOUTH TWICE A WEEK 24 capsule 3  ? ?No current facility-administered medications on file prior to visit.  ? ?Past Medical History:  ?Diagnosis Date  ? Acute encephalopathy 07/20/2018  ? Aortic stenosis   ? CAD (coronary artery disease)   ? CHF (congestive heart failure) (HCC)   ? GERD (gastroesophageal reflux disease)   ? Heart murmur   ? Hernia, inguinal, right   ? 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 Hyperlipidemia   ? Hypertension   ? Impaired  fasting glucose   ? Intraperitoneal abscess (HCC) 04/30/2020  ? Mixed hyperlipidemia   ? 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 LIMA to LAD SVG to CIRCUMFLEX SVG to RCA   ? Severe aortic stenosis 07/01/2018  ? Umbilical hernia   ? Vitamin D deficiency   ? ?Past Surgical History:  ?Procedure Laterality Date  ? AORTIC VALVE REPLACEMENT N/A 07/12/2018  ? Procedure: AORTIC VALVE REPLACEMENT (AVR) using an Inspiris Valve size  23;  Surgeon: [name], MD;  Location: [facility];  Service: Open Heart Surgery;  Laterality: N/A;  ? CATARACT EXTRACTION, BILATERAL    ? COLON SURGERY  04/2020  ? Rt hemicolectomy with reanastomosis. ischemic bowel.  ? COLONOSCOPY    ? CORONARY ARTERY BYPASS GRAFT N/A 07/12/2018  ? Procedure: CORONARY ARTERY BYPASS GRAFTING (CABG) times three using left internal mammary artery and right greater saphenous vein harvested endoscopically.;  Surgeon: [name], MD;  Location: [facility];  Service: Open Heart Surgery;  Laterality: N/A;  ? INSERTION OF MESH  10/02/2019  ? Procedure: INSERTION OF MESH;  Surgeon: Manus Rudd, MD;  Location: [facility];  Service: General;;  ? LOOP RECORDER INSERTION N/A 10/09/2018  ? Procedure: LOOP RECORDER INSERTION;  Surgeon: Hillis Range, MD;  [facility];  Service: Cardiovascular;  Laterality: N/A;  ? RIGHT/LEFT HEART CATH AND CORONARY ANGIOGRAPHY  N/A 07/02/2018  ? Procedure: RIGHT/LEFT HEART CATH AND CORONARY ANGIOGRAPHY;  Surgeon: Lyn RecordsSmith, Henry W, MD;  [facility];  Service: Cardiovascular;  Laterality: N/A;  ? TEE WITHOUT CARDIOVERSION N/A 07/12/2018  ? Procedure: TRANSESOPHAGEAL ECHOCARDIOGRAM (TEE);  Surgeon: Alleen BorneBartle, Bryan K, MD;  [facility];  Service: Open Heart Surgery;  Laterality: N/A;  ? UMBILICAL HERNIA REPAIR N/A 10/02/2019  ? Procedure: UMBILICAL HERNIA REPAIR WITH MESH;  Surgeon: Manus Ruddsuei, Matthew, MD;  Location: [facility];  Service: General;  Laterality: N/A;  ?  ?Family History  ?Problem Relation  Age of Onset  ? Alzheimer's disease Mother   ? Renal Disease Father   ? Cancer - Prostate Father   ? ?Social History  ? ?Socioeconomic History  ? Marital status: Widowed  ?  Spouse name: Not on file  ? Number of children: 2  ? Years of education: Not on file  ? Highest education level: Not on file  ?Occupational History  ?  Comment: [employer] in [place]  ?Tobacco Use  ? Smoking status: Former  ? Smokeless tobacco: Never  ?Vaping Use  ? Vaping Use: Never used  ?Substance and Sexual Activity  ? Alcohol use: Yes  ?  Comment: [employer] occ[employer]  ? Drug use: No  ? Sexual activity: Not on file  ?Other Topics Concern  ? Not on file  ?Social History Narrative  ? Not on file  ? ?Social Determinants of Health  ? ?Financial Resource Strain: Not on file  ?Food Insecurity: Not on file  ?Transportation Needs: Not on file  ?Physical Activity: Not on file  ?Stress: Not on file  ?Social Connections: Not on file  ? ? ?Review of Systems  ?Constitutional:  Negative for appetite change, fatigue and fever.  ?HENT:  Negative for congestion, ear pain, sinus pressure and sore throat.   ?Respiratory:  Negative for cough, chest tightness, shortness of breath and wheezing.   ?Cardiovascular:  Negative for chest pain and palpitations.  ?Gastrointestinal:  Negative for abdominal pain, constipation, diarrhea, nausea and vomiting.  ?Genitourinary:  Negative for dysuria and hematuria.  ?Musculoskeletal:  Negative for arthralgias, back pain, joint swelling and myalgias.  ?Skin:  Positive for rash (right lower leg).  ?Neurological:  Negative for dizziness, weakness and headaches.  ?Psychiatric/Behavioral:  Negative for dysphoric mood. The patient is not nervous/anxious.   ? ? ?Objective:  ?BP 120/70   Pulse (!) 56   Temp (!) 97.1 ?F (36.2 ?C)   Resp 16   Ht 5\' 10"  (1.778 m)   Wt 245 lb (111.1 kg)   BMI 35.15 kg/m?  ? ?BP/Weight 11/30/2021 04/22/2021 12/10/2020  ?Systolic BP 120 110 110  ?Diastolic BP 70 70 70  ?Wt. (Lbs) 245 243 237  ?BMI 35.15 32.96  32.14  ? ? ?Physical Exam ?Vitals reviewed.  ?Constitutional:   ?   Appearance: Normal appearance. He is normal weight.  ?Neck:  ?   Vascular: No carotid bruit.  ?Cardiovascular:  ?   Rate and Rhythm: Normal rate and regular rhythm.  ?   Heart sounds: Normal heart sounds.  ?Pulmonary:  ?   Effort: Pulmonary effort is normal.  ?   Breath sounds: Normal breath sounds.  ?Abdominal:  ?   General: Abdomen is flat. Bowel sounds are normal.  ?   Palpations: Abdomen is soft.  ?Skin: ?   Findings: Rash (rt posterior leg. papular rash.) present.  ?Neurological:  ?   Mental Status: He is alert and oriented to  person, place, and time.  ?Psychiatric:     ?   Mood and Affect: Mood normal.     ?   Behavior: Behavior normal.  ? ? ?Diabetic Foot Exam - Simple   ?No data filed ?  ?  ? ?Lab Results  ?Component Value Date  ? WBC 7.6 04/22/2021  ? HGB 14.6 04/22/2021  ? HCT 44.8 04/22/2021  ? PLT 301 04/22/2021  ? GLUCOSE 102 (H) 04/22/2021  ? CHOL 116 04/22/2021  ? TRIG 66 04/22/2021  ? HDL 60 04/22/2021  ? LDLCALC 42 04/22/2021  ? ALT 26 04/22/2021  ? AST 25 04/22/2021  ? NA 138 04/22/2021  ? K 5.1 04/22/2021  ? CL 102 04/22/2021  ? CREATININE 0.80 04/22/2021  ? BUN 11 04/22/2021  ? CO2 24 04/22/2021  ? TSH 2.180 06/16/2020  ? INR 1.20 07/20/2018  ? HGBA1C 6.0 (H) 04/22/2021  ? ? ? ? ?Assessment & Plan:  ? ?Problem List Items Addressed This Visit   ? ?  ? Cardiovascular and Mediastinum  ? Coronary artery disease  ?  The current medical regimen is effective;  continue present plan and medications. ?Continue Aspirin 325 mg once daily, Metoprolol tartrate 25 mg take 1 tablet twice daily, Lisinopril 20 mg take 1 tablet daily, and repatha. ? ?  ?  ? Atherosclerosis of aorta (HCC)  ? Hypertensive heart disease without heart failure  ? Relevant Orders  ? CBC With Diff/Platelet  ? Comprehensive metabolic panel  ?  ? Digestive  ? GERD (gastroesophageal reflux disease)  ?  The current medical regimen is effective;  continue present plan and  medications. Continue nexium 40 mg daily.  ?  ?  ?  ? Musculoskeletal and Integument  ? Rash  ?  Kenalog shot and triamcinalone cream. ?  ?  ? Relevant Medications  ? triamcinolone acetonide (KENALOG-40) injection 8

## 2021-11-30 ENCOUNTER — Encounter: Payer: Self-pay | Admitting: Family Medicine

## 2021-11-30 ENCOUNTER — Other Ambulatory Visit: Payer: Self-pay

## 2021-11-30 ENCOUNTER — Ambulatory Visit (INDEPENDENT_AMBULATORY_CARE_PROVIDER_SITE_OTHER): Payer: BC Managed Care – PPO | Admitting: Family Medicine

## 2021-11-30 VITALS — BP 120/70 | HR 56 | Temp 97.1°F | Resp 16 | Ht 70.0 in | Wt 245.0 lb

## 2021-11-30 DIAGNOSIS — I119 Hypertensive heart disease without heart failure: Secondary | ICD-10-CM | POA: Diagnosis not present

## 2021-11-30 DIAGNOSIS — Z6835 Body mass index (BMI) 35.0-35.9, adult: Secondary | ICD-10-CM

## 2021-11-30 DIAGNOSIS — R21 Rash and other nonspecific skin eruption: Secondary | ICD-10-CM | POA: Insufficient documentation

## 2021-11-30 DIAGNOSIS — E782 Mixed hyperlipidemia: Secondary | ICD-10-CM | POA: Diagnosis not present

## 2021-11-30 DIAGNOSIS — R7303 Prediabetes: Secondary | ICD-10-CM | POA: Diagnosis not present

## 2021-11-30 DIAGNOSIS — Z953 Presence of xenogenic heart valve: Secondary | ICD-10-CM

## 2021-11-30 DIAGNOSIS — E6609 Other obesity due to excess calories: Secondary | ICD-10-CM

## 2021-11-30 DIAGNOSIS — K219 Gastro-esophageal reflux disease without esophagitis: Secondary | ICD-10-CM

## 2021-11-30 DIAGNOSIS — I700 Atherosclerosis of aorta: Secondary | ICD-10-CM | POA: Insufficient documentation

## 2021-11-30 DIAGNOSIS — I2510 Atherosclerotic heart disease of native coronary artery without angina pectoris: Secondary | ICD-10-CM

## 2021-11-30 MED ORDER — TRIAMCINOLONE ACETONIDE 0.1 % EX CREA: 1.0000 "application " | TOPICAL_CREAM | Freq: Two times a day (BID) | CUTANEOUS | 1 refills | Status: DC

## 2021-11-30 MED ORDER — TRIAMCINOLONE ACETONIDE 40 MG/ML IJ SUSP
80.0000 mg | Freq: Once | INTRAMUSCULAR | Status: AC
  Administered 2021-11-30: 80 mg via INTRAMUSCULAR

## 2021-11-30 MED ORDER — REPATHA SURECLICK 140 MG/ML ~~LOC~~ SOAJ: SUBCUTANEOUS | 1 refills | Status: DC

## 2021-11-30 NOTE — Assessment & Plan Note (Signed)
Recommend continue to work on eating healthy diet and exercise.  

## 2021-11-30 NOTE — Assessment & Plan Note (Signed)
Kenalog shot and triamcinalone cream. ?

## 2021-11-30 NOTE — Assessment & Plan Note (Signed)
Well controlled.  ?No changes to medicines.  ?Continue to work on eating a healthy diet and exercise.  ?Labs drawn today.  ?

## 2021-11-30 NOTE — Assessment & Plan Note (Signed)
The current medical regimen is effective;  continue present plan and medications. ?Continue Aspirin 325 mg once daily, Metoprolol tartrate 25 mg take 1 tablet twice daily, Lisinopril 20 mg take 1 tablet daily, and repatha. ? ?

## 2021-11-30 NOTE — Assessment & Plan Note (Addendum)
Recommend continue to work on eating healthy diet and exercise. ?Comorbidities: hyperlipidemia and CORONARY ARTERY DISEASE ?

## 2021-11-30 NOTE — Assessment & Plan Note (Signed)
The current medical regimen is effective;  continue present plan and medications. Continue nexium 40 mg daily.  

## 2021-12-01 LAB — COMPREHENSIVE METABOLIC PANEL
ALT: 29 IU/L (ref 0–44)
AST: 27 IU/L (ref 0–40)
Albumin/Globulin Ratio: 1.9 (ref 1.2–2.2)
Albumin: 4.4 g/dL (ref 3.7–4.7)
Alkaline Phosphatase: 96 IU/L (ref 44–121)
BUN/Creatinine Ratio: 14 (ref 10–24)
BUN: 13 mg/dL (ref 8–27)
Bilirubin Total: 0.4 mg/dL (ref 0.0–1.2)
CO2: 22 mmol/L (ref 20–29)
Calcium: 10.3 mg/dL — ABNORMAL HIGH (ref 8.6–10.2)
Chloride: 104 mmol/L (ref 96–106)
Creatinine, Ser: 0.91 mg/dL (ref 0.76–1.27)
Globulin, Total: 2.3 g/dL (ref 1.5–4.5)
Glucose: 112 mg/dL — ABNORMAL HIGH (ref 70–99)
Potassium: 5.3 mmol/L — ABNORMAL HIGH (ref 3.5–5.2)
Sodium: 142 mmol/L (ref 134–144)
Total Protein: 6.7 g/dL (ref 6.0–8.5)
eGFR: 90 mL/min/{1.73_m2} (ref >59)

## 2021-12-01 LAB — CBC WITH DIFF/PLATELET
Basophils Absolute: 0.1 10*3/uL (ref 0.0–0.2)
Basos: 1 %
EOS (ABSOLUTE): 0.2 10*3/uL (ref 0.0–0.4)
Eos: 3 %
Hematocrit: 45.7 % (ref 37.5–51.0)
Hemoglobin: 15.6 g/dL (ref 13.0–17.7)
Immature Grans (Abs): 0 10*3/uL (ref 0.0–0.1)
Immature Granulocytes: 0 %
Lymphocytes Absolute: 1.9 10*3/uL (ref 0.7–3.1)
Lymphs: 28 %
MCH: 31.7 pg (ref 26.6–33.0)
MCHC: 34.1 g/dL (ref 31.5–35.7)
MCV: 93 fL (ref 79–97)
Monocytes Absolute: 0.7 10*3/uL (ref 0.1–0.9)
Monocytes: 10 %
Neutrophils Absolute: 4 10*3/uL (ref 1.4–7.0)
Neutrophils: 58 %
Platelets: 242 10*3/uL (ref 150–450)
RBC: 4.92 x10E6/uL (ref 4.14–5.80)
RDW: 12.2 % (ref 11.6–15.4)
WBC: 6.8 10*3/uL (ref 3.4–10.8)

## 2021-12-01 LAB — LIPID PANEL
Chol/HDL Ratio: 2.2 ratio (ref 0.0–5.0)
Cholesterol, Total: 114 mg/dL (ref 100–199)
HDL: 53 mg/dL (ref >39)
LDL Chol Calc (NIH): 41 mg/dL (ref 0–99)
Triglycerides: 108 mg/dL (ref 0–149)
VLDL Cholesterol Cal: 20 mg/dL (ref 5–40)

## 2021-12-01 LAB — HEMOGLOBIN A1C
Est. average glucose Bld gHb Est-mCnc: 128 mg/dL
Hgb A1c MFr Bld: 6.1 % — ABNORMAL HIGH (ref 4.8–5.6)

## 2021-12-01 LAB — CARDIOVASCULAR RISK ASSESSMENT

## 2021-12-13 ENCOUNTER — Ambulatory Visit (INDEPENDENT_AMBULATORY_CARE_PROVIDER_SITE_OTHER): Payer: BC Managed Care – PPO

## 2021-12-13 DIAGNOSIS — I639 Cerebral infarction, unspecified: Secondary | ICD-10-CM | POA: Diagnosis not present

## 2021-12-13 NOTE — Progress Notes (Deleted)
Patient ID: Daniel Mann, male   DOB: 1951-05-16, 71 y.o.   MRN: [id number] ?  ?  ?Cardiology Office Note ? ? ?Date:  12/13/2021  ? ?IDMassiah Mann, DOB [birth year], MRN [id number] ? ?PCP:  Rochel Brome, MD  ?Cardiologist:   Jenkins Rouge, MD  ? ?No chief complaint on file. ? ? ?  ?History of Present Illness: ?71 y.o. with history of CAD and aortic stenosis. Had progression of his AS by TTE on 06/19/18 with mean gradient 41 mmHg Peak 103 mmHg. EF 60-65% Cath by Dr Tamala Julian 07/02/18 showed total occlusion of RCA and significant disease in LAD and  Large OM. Seen by Dr Cyndia Bent and operated on 07/12/18 with LIMA to LAD, SVG OM2, SVG RCA and AVR with 23 mm Edwards Inspiris Resilia pericardial valve. Post op course unremarkable No PAF.  D/c on ASA and beta blocker ? ?Admitted 07/20/18 with , headache and change in MS. MRI with subacute small bilateral parietal and left frontal MA infarcts.      ? ?ILR placed by Dr Rayann Heman for cryptogenic stroke 10/09/18 to date no PAF detected ? ?Had large ventral hernia repaired by Dr Georgette Dover 10/02/19 with no cardiac complications ? ?Some depression as wife and older son passed away 3 years ago ? ?*** ? ? ?Past Medical History:  ?Diagnosis Date  ? Acute encephalopathy 07/20/2018  ? Aortic stenosis   ? CAD (coronary artery disease)   ? CHF (congestive heart failure) ([place])   ? GERD (gastroesophageal reflux disease)   ? Heart murmur   ? Hernia, inguinal, right   ? 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 Hyperlipidemia   ? Hypertension   ? Impaired fasting glucose   ? Intraperitoneal abscess ([place]) 04/30/2020  ? Mixed hyperlipidemia   ? 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 LIMA to LAD SVG to CIRCUMFLEX SVG to RCA   ? Severe aortic stenosis 07/01/2018  ? Umbilical hernia   ? Vitamin D deficiency   ? ? ?Past Surgical History:  ?Procedure Laterality Date  ? AORTIC VALVE REPLACEMENT N/A 07/12/2018  ? Procedure: AORTIC VALVE REPLACEMENT (AVR) using an Inspiris Valve size  23;  Surgeon: Gaye Pollack, MD;  [facility];   Service: Open Heart Surgery;  Laterality: N/A;  ? CATARACT EXTRACTION, BILATERAL    ? COLON SURGERY  04/2020  ? Rt hemicolectomy with reanastomosis. ischemic bowel.  ? COLONOSCOPY    ? CORONARY ARTERY BYPASS GRAFT N/A 07/12/2018  ? Procedure: CORONARY ARTERY BYPASS GRAFTING (CABG) times three using left internal mammary artery and right greater saphenous vein harvested endoscopically.;  Surgeon: Gaye Pollack, MD;  [facility];  Service: Open Heart Surgery;  Laterality: N/A;  ? INSERTION OF MESH  10/02/2019  ? Procedure: INSERTION OF MESH;  Surgeon: Donnie Mesa, MD;  Location: [place];  Service: General;;  ? LOOP RECORDER INSERTION N/A 10/09/2018  ? Procedure: LOOP RECORDER INSERTION;  Surgeon: Thompson Grayer, MD;  Location: [facility];  Service: Cardiovascular;  Laterality: N/A;  ? RIGHT/LEFT HEART CATH AND CORONARY ANGIOGRAPHY N/A 07/02/2018  ? Procedure: RIGHT/LEFT HEART CATH AND CORONARY ANGIOGRAPHY;  Surgeon: Belva Crome, MD;  Location: [facility];  Service: Cardiovascular;  Laterality: N/A;  ? TEE WITHOUT CARDIOVERSION N/A 07/12/2018  ? Procedure: TRANSESOPHAGEAL ECHOCARDIOGRAM (TEE);  Surgeon: Gaye Pollack, MD;  Location: [facility];  Service: Open Heart Surgery;  Laterality: N/A;  ? UMBILICAL HERNIA REPAIR N/A 10/02/2019  ? Procedure: UMBILICAL  HERNIA REPAIR WITH MESH;  Surgeon: Manus Rudd, MD;  Location: [facility];  Service: General;  Laterality: N/A;  ? ? ? ?Current Outpatient Medications  ?Medication Sig Dispense Refill  ? acetaminophen (TYLENOL) 325 MG tablet Take 2 tablets (650 mg total) by mouth every 6 (six) hours as needed for mild pain.    ? aspirin 325 MG tablet Take 325 mg by mouth daily.    ? esomeprazole (NEXIUM) 40 MG capsule TAKE 1 CAPSULE BY MOUTH EVERY DAY 90 capsule 3  ? Evolocumab (REPATHA SURECLICK) 140 MG/ML SOAJ INJECT 1 EACH INTO THE SKIN EVERY 14 (FOURTEEN) DAYS. *PA PENDING 6 mL 1  ? lisinopril (ZESTRIL) 20 MG tablet Take 1  tablet (20 mg total) by mouth daily. 90 tablet 3  ? metoprolol tartrate (LOPRESSOR) 25 MG tablet Take 1 tablet (25 mg total) by mouth 2 (two) times daily. 180 tablet 3  ? Multiple Vitamin (MULTIVITAMIN) tablet Take 1 tablet by mouth daily.    ? triamcinolone cream (KENALOG) 0.1 % Apply 1 application. topically 2 (two) times daily. 80 g 1  ? Vitamin D, Ergocalciferol, (DRISDOL) 1.25 MG (50000 UNIT) CAPS capsule TAKE 1 CAPSULE(S) BY MOUTH TWICE A WEEK 24 capsule 3  ? ?No current facility-administered medications for this visit.  ? ? ?Allergies:   Statins  ? ? ?Social History:  The patient  reports that he has quit smoking. He has never used smokeless tobacco. He reports current alcohol use. He reports that he does not use drugs.  ? ?Family History:  The patient's family history includes Alzheimer's disease in his mother; Cancer - Prostate in his father; Renal Disease in his father.  ? ? ?ROS:  Please see the history of present illness.   Otherwise, review of systems are positive for none.   All other systems are reviewed and negative.  ? ? ?PHYSICAL EXAM: ?VS:  There were no vitals taken for this visit. , BMI There is no height or weight on file to calculate BMI. ?Affect appropriate ?Healthy:  appears stated age ?HEENT: normal ?Neck supple with no adenopathy ?JVP normal no bruits no thyromegaly ?Lungs clear with no wheezing and good diaphragmatic motion ?Heart:  S1/S2 SEM through AVR post sternotomy  ?Abdomen: benighn, large ventral hernia ?no bruit.  No HSM or HJR ventral hernia  ?Distal pulses intact with no bruits ?No edema post SVG harvesting for CABG  ?Neuro non-focal ?Skin warm and dry ?No muscular weakness ? ?EKG:   NSR possible old IMI      06/10/16  SR rate 76 normal ECG  06/01/17 SR rate 76 normal  ?01/01/18 NSR rate 73 normal  ? ?Recent Labs: ?11/30/2021: ALT 29; BUN 13; Creatinine, Ser 0.91; Hemoglobin 15.6; Platelets 242; Potassium 5.3; Sodium 142  ? ? ?Lipid Panel ?   ?Component Value Date/Time  ? CHOL 114  11/30/2021 0753  ? TRIG 108 11/30/2021 0753  ? HDL 53 11/30/2021 0753  ? CHOLHDL 2.2 11/30/2021 0753  ? CHOLHDL 4.6 07/22/2018 0553  ? VLDL 22 07/22/2018 0553  ? LDLCALC 41 11/30/2021 0753  ? ?  ? ?Wt Readings from Last 3 Encounters:  ?11/30/21 245 lb (111.1 kg)  ?04/22/21 243 lb (110.2 kg)  ?12/10/20 237 lb (107.5 kg)  ?  ? ? ?Other studies Reviewed: ?Additional studies/ records that were reviewed today include: Records from [facility] practice Dr Sedalia Muta. ? ? ? ?ASSESSMENT AND PLAN: ? ?1.  Aortic Stenosis :post 23 mm bioprosthetic tissue AVR 07/12/18  TTE  09/16/19 with normal EF and mean gradient 14 peak 28 mmHg no PVL will update echo  ? ?2. HTN:  Well controlled.  Continue current medications and low sodium Dash type diet.   ? ?3. Chol: on Repatha labs with primary LDL 42 11/30/21  ? ?4. CVA:  Concern for occult PAF post op Did not have any in hospital MRI with infarcts though  ILR with no PAF  ? ?5. Ventral Hernia:  improved post repair 10/02/19  ? ?6. Depression:  Wife and older son both died a 3 years  ago He is lonely and living in house alone Marchia Meiers much works at [employer] 7 days /week has younger son Rodman Key that works at [employer] 3rd shift  ? ? ? ?Disposition:   FU with me in a year  ? ? ? ?Signed, ?Jenkins Rouge, MD  ?12/13/2021 2:13 PM    ?[place] ?[address], [place], [place]  [address] ?Phone: [phone]; Fax: [phone]  ?

## 2021-12-14 ENCOUNTER — Ambulatory Visit (INDEPENDENT_AMBULATORY_CARE_PROVIDER_SITE_OTHER): Payer: BC Managed Care – PPO

## 2021-12-14 DIAGNOSIS — Z23 Encounter for immunization: Secondary | ICD-10-CM

## 2021-12-14 LAB — CUP PACEART REMOTE DEVICE CHECK
Date Time Interrogation Session: 20230402232921
Implantable Pulse Generator Implant Date: 20200128

## 2021-12-14 NOTE — Progress Notes (Signed)
? ?  Covid-19 Vaccination Clinic ? ?Name:  Daniel Mann    ?MRN: [id number] ?DOB: 1950/11/23 ? ?12/14/2021 ? ?[name] was observed post Covid-19 immunization for 15 minutes without incident. He was provided with Vaccine Information Sheet and instruction to access the V-Safe system.  ? ?[name] was instructed to call 911 with any severe reactions post vaccine: ?Difficulty breathing  ?Swelling of face and throat  ?A fast heartbeat  ?A bad rash all over body  ?Dizziness and weakness  ? ?Immunizations Administered   ? ? Name Date Dose VIS Date Route  ? Ambulance person Booster 12/14/2021  1:44 PM 0.3 mL 05/12/2021 Intramuscular  ? Manufacturer: [place]: [id number]  ? [facility] [place]: [id number]  ? ?  ?  ?

## 2021-12-16 ENCOUNTER — Ambulatory Visit: Payer: BC Managed Care – PPO | Admitting: Cardiovascular Disease

## 2021-12-27 NOTE — Progress Notes (Signed)
Carelink Summary Report / Loop Recorder 

## 2022-01-17 ENCOUNTER — Ambulatory Visit (INDEPENDENT_AMBULATORY_CARE_PROVIDER_SITE_OTHER): Payer: BC Managed Care – PPO

## 2022-01-17 DIAGNOSIS — I639 Cerebral infarction, unspecified: Secondary | ICD-10-CM

## 2022-01-17 LAB — CUP PACEART REMOTE DEVICE CHECK
Date Time Interrogation Session: 20230507230711
Implantable Pulse Generator Implant Date: 20200128

## 2022-02-01 NOTE — Progress Notes (Signed)
Carelink Summary Report / Loop Recorder 

## 2022-02-21 ENCOUNTER — Ambulatory Visit (INDEPENDENT_AMBULATORY_CARE_PROVIDER_SITE_OTHER): Payer: BC Managed Care – PPO

## 2022-02-21 DIAGNOSIS — I639 Cerebral infarction, unspecified: Secondary | ICD-10-CM

## 2022-02-23 LAB — CUP PACEART REMOTE DEVICE CHECK
Date Time Interrogation Session: 20230609230716
Implantable Pulse Generator Implant Date: 20200128

## 2022-03-15 ENCOUNTER — Other Ambulatory Visit: Payer: Self-pay | Admitting: Family Medicine

## 2022-03-15 DIAGNOSIS — E6609 Other obesity due to excess calories: Secondary | ICD-10-CM

## 2022-03-24 LAB — CUP PACEART REMOTE DEVICE CHECK
Date Time Interrogation Session: 20230712231945
Implantable Pulse Generator Implant Date: 20200128

## 2022-03-28 ENCOUNTER — Ambulatory Visit (INDEPENDENT_AMBULATORY_CARE_PROVIDER_SITE_OTHER): Payer: BC Managed Care – PPO

## 2022-03-28 DIAGNOSIS — I639 Cerebral infarction, unspecified: Secondary | ICD-10-CM | POA: Diagnosis not present

## 2022-04-01 ENCOUNTER — Other Ambulatory Visit: Payer: Self-pay | Admitting: Family Medicine

## 2022-04-01 DIAGNOSIS — I119 Hypertensive heart disease without heart failure: Secondary | ICD-10-CM

## 2022-04-03 ENCOUNTER — Other Ambulatory Visit: Payer: Self-pay | Admitting: Family Medicine

## 2022-04-03 DIAGNOSIS — K219 Gastro-esophageal reflux disease without esophagitis: Secondary | ICD-10-CM

## 2022-04-05 DIAGNOSIS — L821 Other seborrheic keratosis: Secondary | ICD-10-CM | POA: Diagnosis not present

## 2022-04-05 DIAGNOSIS — C4442 Squamous cell carcinoma of skin of scalp and neck: Secondary | ICD-10-CM | POA: Diagnosis not present

## 2022-04-05 DIAGNOSIS — L814 Other melanin hyperpigmentation: Secondary | ICD-10-CM | POA: Diagnosis not present

## 2022-04-05 DIAGNOSIS — D225 Melanocytic nevi of trunk: Secondary | ICD-10-CM | POA: Diagnosis not present

## 2022-04-25 NOTE — Progress Notes (Signed)
Carelink Summary Report / Loop Recorder 

## 2022-05-02 ENCOUNTER — Ambulatory Visit (INDEPENDENT_AMBULATORY_CARE_PROVIDER_SITE_OTHER): Payer: BC Managed Care – PPO

## 2022-05-02 DIAGNOSIS — I639 Cerebral infarction, unspecified: Secondary | ICD-10-CM

## 2022-05-04 LAB — CUP PACEART REMOTE DEVICE CHECK
Date Time Interrogation Session: 20230814232258
Implantable Pulse Generator Implant Date: 20200128

## 2022-05-30 NOTE — Progress Notes (Signed)
Carelink Summary Report / Loop Recorder 

## 2022-05-31 LAB — CUP PACEART REMOTE DEVICE CHECK
Date Time Interrogation Session: 20230916232616
Implantable Pulse Generator Implant Date: 20200128

## 2022-06-01 DIAGNOSIS — H6121 Impacted cerumen, right ear: Secondary | ICD-10-CM | POA: Diagnosis not present

## 2022-06-04 ENCOUNTER — Other Ambulatory Visit: Payer: Self-pay | Admitting: Family Medicine

## 2022-06-04 DIAGNOSIS — E6609 Other obesity due to excess calories: Secondary | ICD-10-CM

## 2022-06-06 ENCOUNTER — Ambulatory Visit (INDEPENDENT_AMBULATORY_CARE_PROVIDER_SITE_OTHER): Payer: BC Managed Care – PPO

## 2022-06-06 DIAGNOSIS — I639 Cerebral infarction, unspecified: Secondary | ICD-10-CM

## 2022-06-08 NOTE — Progress Notes (Unsigned)
Subjective:  Patient ID: Daniel Mann, male    DOB: Oct 29, 1950  Age: 71 y.o. MRN: [id number]  Chief Complaint  Patient presents with   Diabetes   Hypertension   Hyperlipidemia    HPI Hyperlipidemia: Current medications: Repatha 140 mg every 14 days  Hypertension: Complications: CORONARY ARTERY DISEASE/Obesity. Current medications: Metoprolol 25 mg twice a day, Lisinopril 20 mg daily.  GERD: Nexium 40 mg daily.  CAD: Repatha 140 mg every 14 days, aspirin 325 mg daily, metoprolol 25 mg twice daily.  Patient sees Dr. Eden Emms.   Vitamin D: Vitamin D 50K twice a week.   Diet: healthy. Exercise: Active at work.   Current Outpatient Medications on File Prior to Visit  Medication Sig Dispense Refill   acetaminophen (TYLENOL) 325 MG tablet Take 2 tablets (650 mg total) by mouth every 6 (six) hours as needed for mild pain.     aspirin 325 MG tablet Take 325 mg by mouth daily.     esomeprazole (NEXIUM) 40 MG capsule TAKE 1 CAPSULE BY MOUTH EVERY DAY 90 capsule 0   Evolocumab (REPATHA SURECLICK) 140 MG/ML SOAJ INJECT 1 EACH INTO THE SKIN EVERY 14 (FOURTEEN) DAYS. *PA PENDING 6 mL 1   metoprolol tartrate (LOPRESSOR) 25 MG tablet Take 1 tablet (25 mg total) by mouth 2 (two) times daily. 180 tablet 3   Multiple Vitamin (MULTIVITAMIN) tablet Take 1 tablet by mouth daily.     triamcinolone cream (KENALOG) 0.1 % Apply 1 application. topically 2 (two) times daily. 80 g 1   Vitamin D, Ergocalciferol, (DRISDOL) 1.25 MG (50000 UNIT) CAPS capsule TAKE 1 CAPSULE(S) BY MOUTH TWICE A WEEK 24 capsule 3   No current facility-administered medications on file prior to visit.   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 ischemic bowel.   COLONOSCOPY     CORONARY ARTERY BYPASS GRAFT N/A 07/12/2018   Procedure: CORONARY ARTERY BYPASS GRAFTING (CABG) times three using left internal mammary artery and right greater saphenous vein harvested endoscopically.;  Surgeon: [name], MD;  Location: [facility];  Service: Open Heart Surgery;  Laterality: N/A;   INSERTION OF MESH  10/02/2019   Procedure: INSERTION OF MESH;  Surgeon: Manus Rudd, MD;  Location: [facility];  Service: General;;   LOOP RECORDER INSERTION N/A 10/09/2018   Procedure: LOOP RECORDER INSERTION;  Surgeon: Hillis Range, MD;  Location: [facility];  Service: Cardiovascular;  Laterality: N/A;   RIGHT/LEFT HEART CATH  AND CORONARY ANGIOGRAPHY N/A 07/02/2018   Procedure: RIGHT/LEFT HEART CATH AND CORONARY ANGIOGRAPHY;  Surgeon: Belva Crome, MD;  Location: [facility];  Service: Cardiovascular;  Laterality: N/A;   TEE WITHOUT CARDIOVERSION N/A 07/12/2018   Procedure: TRANSESOPHAGEAL ECHOCARDIOGRAM (TEE);  Surgeon: Gaye Pollack, MD;  Location: [place];  Service: Open Heart Surgery;  Laterality: N/A;   UMBILICAL HERNIA REPAIR N/A 10/02/2019   Procedure: UMBILICAL HERNIA REPAIR WITH MESH;  Surgeon: Donnie Mesa, MD;  Location: [facility];  Service: General;  Laterality: N/A;    Family History   Problem Relation Age of Onset   Alzheimer's disease Mother    Renal Disease Father    Cancer - Prostate Father    Social History   Socioeconomic History   Marital status: Widowed    Spouse name: Not on file   Number of children: 2   Years of education: Not on file   Highest education level: Not on file  Occupational History    Comment: [employer] in [place]  Tobacco Use   Smoking status: Former   Smokeless tobacco: Never  Scientific laboratory technician Use: Never used  Substance and Sexual Activity   Alcohol use: Yes    Comment: WINE occais   Drug use: No   Sexual activity: Not on file  Other Topics Concern   Not on file  Social History Narrative   Not on file   Social Determinants of Health   Financial Resource Strain: Not on file  Food Insecurity: Not on file  Transportation Needs: Not on file  Physical Activity: Not on file  Stress: Not on file  Social Connections: Not on file    Review of Systems  Constitutional:  Negative for chills and fever.  HENT:  Negative for congestion, rhinorrhea and sore throat.   Respiratory:  Negative for cough and shortness of breath.   Cardiovascular:  Negative for chest pain and palpitations.  Gastrointestinal:  Negative for abdominal pain, constipation, diarrhea, nausea and vomiting.  Genitourinary:  Negative for dysuria and urgency.  Musculoskeletal:  Positive for arthralgias (pt has seen foot doctor and got orthotics BL.). Negative for back pain, joint swelling and myalgias.       Foot pain   Skin:  Negative for color change.  Neurological:  Negative for dizziness and headaches.  Psychiatric/Behavioral:  Negative for dysphoric mood. The patient is not nervous/anxious.      Objective:  BP 124/64   Pulse 68   Temp (!) 97 F (36.1 C)   Resp 14   Ht 6' (1.829 m)   Wt 243 lb (110.2 kg)   BMI 32.96 kg/m      06/09/2022    8:02 AM 06/09/2022    7:27 AM 11/30/2021    7:31 AM  BP/Weight  Systolic BP 818 563 149  Diastolic BP  64 78 70  Wt. (Lbs)  243 245  BMI  32.96 kg/m2 35.15 kg/m2    Physical Exam Vitals reviewed.  Constitutional:      Appearance: Normal appearance.  Neck:     Vascular: No carotid bruit.  Cardiovascular:     Rate and Rhythm: Normal rate and regular rhythm.     Heart sounds: Normal heart sounds.  Pulmonary:     Effort: Pulmonary effort is normal.     Breath sounds: Normal breath sounds. No wheezing, rhonchi or rales.  Abdominal:     General: Bowel sounds are normal.     Palpations:  Abdomen is soft.     Tenderness: There is no abdominal tenderness.  Neurological:     Mental Status: He is alert and oriented to person, place, and time.  Psychiatric:        Mood and Affect: Mood normal.        Behavior: Behavior normal.     Diabetic Foot Exam - Simple   No data filed      Lab Results  Component Value Date   WBC 7.7 06/09/2022   HGB 15.4 06/09/2022   HCT 45.1 06/09/2022   PLT 289 06/09/2022   GLUCOSE 102 (H) 06/09/2022   CHOL 114 06/09/2022   TRIG 134 06/09/2022   HDL 53 06/09/2022   LDLCALC 38 06/09/2022   ALT 28 06/09/2022   AST 24 06/09/2022   NA 137 06/09/2022   K 5.2 06/09/2022   CL 99 06/09/2022   CREATININE 0.86 06/09/2022   BUN 15 06/09/2022   CO2 26 06/09/2022   TSH 2.180 06/16/2020   INR 1.20 07/20/2018   HGBA1C 6.0 (H) 06/09/2022      Assessment & Plan:   Problem List Items Addressed This Visit       Cardiovascular and Mediastinum   Coronary artery disease - Primary    The current medical regimen is effective;  continue present plan and medications. Repatha 140 mg every 14 days, aspirin 325 mg daily, metoprolol 25 mg twice daily.      Relevant Medications   lisinopril (ZESTRIL) 20 MG tablet   Other Relevant Orders   Cardiovascular Risk Assessment (Completed)   Atherosclerosis of aorta (HCC)    Repatha and aspirin.       Relevant Medications   lisinopril (ZESTRIL) 20 MG tablet   Hypertensive heart disease without heart failure     Well controlled.  No changes to medicines. Metoprolol 25 mg twice a day, Lisinopril 20 mg daily, repatha. . Continue to work on eating a healthy diet and exercise.  Labs drawn today.       Relevant Medications   lisinopril (ZESTRIL) 20 MG tablet   Other Relevant Orders   Comprehensive metabolic panel (Completed)   CBC with Differential/Platelet (Completed)     Digestive   GERD (gastroesophageal reflux disease)    Continue Nexium 40 mg daily.  The current medical regimen is effective;  continue present plan and medications.        Other   Vitamin D deficiency    Labs ordered The current medical regimen is effective;  continue present plan and medications.      Relevant Orders   VITAMIN D 25 Hydroxy (Vit-D Deficiency, Fractures) (Completed)   Mixed hyperlipidemia    Well controlled.  No changes to medicines. Repatha 140 mg every 14 days, aspirin 325 mg daily. Continue to work on eating a healthy diet and exercise.  Labs drawn today.       Relevant Medications   lisinopril (ZESTRIL) 20 MG tablet   Other Relevant Orders   Lipid panel (Completed)   Class 1 obesity with serious comorbidity and body mass index (BMI) of 32.0 to 32.9 in adult    Comorbidities include CORONARY ARTERY DISEASE, Hyperlipidemia.  Recommend continue to work on eating healthy diet and exercise.       Prediabetes    Hemoglobin A1c 6.1%, 3 month avg of blood sugars, is in prediabetic range.  In order to prevent progression to diabetes, recommend low carb diet and regular exercise      Relevant Orders  Hemoglobin A1c (Completed)   Need for influenza vaccination   Relevant Orders   Flu Vaccine QUAD High Dose(Fluad) (Completed)  .  Meds ordered this encounter  Medications   lisinopril (ZESTRIL) 20 MG tablet    Sig: Take 1 tablet (20 mg total) by mouth daily.    Dispense:  90 tablet    Refill:  3    Orders Placed This Encounter  Procedures   Flu Vaccine QUAD High Dose(Fluad)    Comprehensive metabolic panel   Hemoglobin A1c   Lipid panel   VITAMIN D 25 Hydroxy (Vit-D Deficiency, Fractures)   CBC with Differential/Platelet   Cardiovascular Risk Assessment    Gwynneth Aliment, acting as a scribe for Blane Ohara, MD.,have documented all relevant documentation on the behalf of Blane Ohara, MD,as directed by  Blane Ohara, MD while in the presence of Blane Ohara, MD.  Follow-up: Return in about 6 months (around 12/08/2022) for chronic fasting.  An After Visit Summary was printed and given to the patient.  Blane Ohara, MD [facility] [phone]

## 2022-06-09 ENCOUNTER — Ambulatory Visit (INDEPENDENT_AMBULATORY_CARE_PROVIDER_SITE_OTHER): Payer: BC Managed Care – PPO | Admitting: Family Medicine

## 2022-06-09 ENCOUNTER — Encounter: Payer: Self-pay | Admitting: Family Medicine

## 2022-06-09 VITALS — BP 124/64 | HR 68 | Temp 97.0°F | Resp 14 | Ht 72.0 in | Wt 243.0 lb

## 2022-06-09 DIAGNOSIS — I2510 Atherosclerotic heart disease of native coronary artery without angina pectoris: Secondary | ICD-10-CM

## 2022-06-09 DIAGNOSIS — R7303 Prediabetes: Secondary | ICD-10-CM

## 2022-06-09 DIAGNOSIS — K219 Gastro-esophageal reflux disease without esophagitis: Secondary | ICD-10-CM

## 2022-06-09 DIAGNOSIS — Z23 Encounter for immunization: Secondary | ICD-10-CM

## 2022-06-09 DIAGNOSIS — E559 Vitamin D deficiency, unspecified: Secondary | ICD-10-CM | POA: Diagnosis not present

## 2022-06-09 DIAGNOSIS — E6609 Other obesity due to excess calories: Secondary | ICD-10-CM

## 2022-06-09 DIAGNOSIS — E782 Mixed hyperlipidemia: Secondary | ICD-10-CM

## 2022-06-09 DIAGNOSIS — I119 Hypertensive heart disease without heart failure: Secondary | ICD-10-CM | POA: Diagnosis not present

## 2022-06-09 DIAGNOSIS — I700 Atherosclerosis of aorta: Secondary | ICD-10-CM

## 2022-06-09 DIAGNOSIS — Z6832 Body mass index (BMI) 32.0-32.9, adult: Secondary | ICD-10-CM

## 2022-06-09 MED ORDER — LISINOPRIL 20 MG PO TABS: 20.0000 mg | ORAL_TABLET | Freq: Every day | ORAL | 3 refills | Status: DC

## 2022-06-09 NOTE — Patient Instructions (Addendum)
Recommend shingrix, covid booster, and rsv vaccines at the pharmacy.

## 2022-06-10 LAB — CBC WITH DIFFERENTIAL/PLATELET
Basophils Absolute: 0.1 10*3/uL (ref 0.0–0.2)
Basos: 1 %
EOS (ABSOLUTE): 0.2 10*3/uL (ref 0.0–0.4)
Eos: 2 %
Hematocrit: 45.1 % (ref 37.5–51.0)
Hemoglobin: 15.4 g/dL (ref 13.0–17.7)
Immature Grans (Abs): 0 10*3/uL (ref 0.0–0.1)
Immature Granulocytes: 0 %
Lymphocytes Absolute: 1.7 10*3/uL (ref 0.7–3.1)
Lymphs: 22 %
MCH: 32.5 pg (ref 26.6–33.0)
MCHC: 34.1 g/dL (ref 31.5–35.7)
MCV: 95 fL (ref 79–97)
Monocytes Absolute: 0.8 10*3/uL (ref 0.1–0.9)
Monocytes: 10 %
Neutrophils Absolute: 5 10*3/uL (ref 1.4–7.0)
Neutrophils: 65 %
Platelets: 289 10*3/uL (ref 150–450)
RBC: 4.74 x10E6/uL (ref 4.14–5.80)
RDW: 12.1 % (ref 11.6–15.4)
WBC: 7.7 10*3/uL (ref 3.4–10.8)

## 2022-06-10 LAB — HEMOGLOBIN A1C
Est. average glucose Bld gHb Est-mCnc: 126 mg/dL
Hgb A1c MFr Bld: 6 % — ABNORMAL HIGH (ref 4.8–5.6)

## 2022-06-10 LAB — COMPREHENSIVE METABOLIC PANEL
ALT: 28 IU/L (ref 0–44)
AST: 24 IU/L (ref 0–40)
Albumin/Globulin Ratio: 2.1 (ref 1.2–2.2)
Albumin: 4.4 g/dL (ref 3.8–4.8)
Alkaline Phosphatase: 86 IU/L (ref 44–121)
BUN/Creatinine Ratio: 17 (ref 10–24)
BUN: 15 mg/dL (ref 8–27)
Bilirubin Total: 0.4 mg/dL (ref 0.0–1.2)
CO2: 26 mmol/L (ref 20–29)
Calcium: 10.6 mg/dL — ABNORMAL HIGH (ref 8.6–10.2)
Chloride: 99 mmol/L (ref 96–106)
Creatinine, Ser: 0.86 mg/dL (ref 0.76–1.27)
Globulin, Total: 2.1 g/dL (ref 1.5–4.5)
Glucose: 102 mg/dL — ABNORMAL HIGH (ref 70–99)
Potassium: 5.2 mmol/L (ref 3.5–5.2)
Sodium: 137 mmol/L (ref 134–144)
Total Protein: 6.5 g/dL (ref 6.0–8.5)
eGFR: 92 mL/min/{1.73_m2} (ref >59)

## 2022-06-10 LAB — LIPID PANEL
Chol/HDL Ratio: 2.2 ratio (ref 0.0–5.0)
Cholesterol, Total: 114 mg/dL (ref 100–199)
HDL: 53 mg/dL (ref >39)
LDL Chol Calc (NIH): 38 mg/dL (ref 0–99)
Triglycerides: 134 mg/dL (ref 0–149)
VLDL Cholesterol Cal: 23 mg/dL (ref 5–40)

## 2022-06-10 LAB — CARDIOVASCULAR RISK ASSESSMENT

## 2022-06-10 LAB — VITAMIN D 25 HYDROXY (VIT D DEFICIENCY, FRACTURES): Vit D, 25-Hydroxy: 83.8 ng/mL (ref 30.0–100.0)

## 2022-06-10 NOTE — Assessment & Plan Note (Addendum)
Well controlled.  No changes to medicines. Metoprolol 25 mg twice a day, Lisinopril 20 mg daily. Continue to work on eating a healthy diet and exercise.  Labs drawn today.

## 2022-06-10 NOTE — Assessment & Plan Note (Signed)
Hemoglobin A1c 6.1%, 3 month avg of blood sugars, is in prediabetic range.  In order to prevent progression to diabetes, recommend low carb diet and regular exercise  

## 2022-06-10 NOTE — Assessment & Plan Note (Addendum)
Well controlled.  No changes to medicines. Repatha 140 mg every 14 days, aspirin 325 mg daily. Continue to work on eating a healthy diet and exercise.  Labs drawn today.

## 2022-06-10 NOTE — Assessment & Plan Note (Addendum)
>>  ASSESSMENT AND PLAN FOR CORONARY ARTERY DISEASE WRITTEN ON 06/10/2022  2:40 PM BY LEAL-BORJAS, MARLA I, CMA  The current medical regimen is effective;  continue present plan and medications. Repatha 140 mg every 14 days, aspirin 325 mg daily, metoprolol 25 mg twice daily.  >>ASSESSMENT AND PLAN FOR ATHEROSCLEROSIS OF AORTA WRITTEN ON 06/11/2022 10:54 PM BY COX, KIRSTEN, MD  Repatha and aspirin.

## 2022-06-10 NOTE — Assessment & Plan Note (Signed)
Labs ordered. The current medical regimen is effective;  continue present plan and medications.

## 2022-06-10 NOTE — Assessment & Plan Note (Signed)
Continue Nexium 40 mg daily.  The current medical regimen is effective;  continue present plan and medications.

## 2022-06-11 DIAGNOSIS — Z23 Encounter for immunization: Secondary | ICD-10-CM | POA: Insufficient documentation

## 2022-06-11 NOTE — Assessment & Plan Note (Signed)
Repatha and aspirin.

## 2022-06-11 NOTE — Assessment & Plan Note (Addendum)
Comorbidities include CORONARY ARTERY DISEASE, Hyperlipidemia.  Recommend continue to work on eating healthy diet and exercise.

## 2022-06-12 NOTE — Progress Notes (Signed)
Blood count normal.  Liver function normal.  Kidney function normal.  Calcium little high.  Cholesterol: great HBA1C: 6 Vitamin D excellent. Discontinue vitamin D.

## 2022-06-15 ENCOUNTER — Other Ambulatory Visit: Payer: Self-pay | Admitting: Family Medicine

## 2022-06-15 DIAGNOSIS — K219 Gastro-esophageal reflux disease without esophagitis: Secondary | ICD-10-CM

## 2022-06-15 DIAGNOSIS — I119 Hypertensive heart disease without heart failure: Secondary | ICD-10-CM

## 2022-06-16 ENCOUNTER — Other Ambulatory Visit: Payer: Self-pay | Admitting: Family Medicine

## 2022-06-16 DIAGNOSIS — I119 Hypertensive heart disease without heart failure: Secondary | ICD-10-CM

## 2022-06-16 DIAGNOSIS — Z953 Presence of xenogenic heart valve: Secondary | ICD-10-CM

## 2022-06-24 NOTE — Progress Notes (Signed)
Carelink Summary Report / Loop Recorder 

## 2022-07-04 ENCOUNTER — Telehealth: Payer: Self-pay

## 2022-07-04 NOTE — Telephone Encounter (Signed)
LINQ alert received.  Device has reached RRT 10/21  Outreach made to Pt.  Advised his loop was end of life.  Advised he could leave it in or schedule an appt to have removed.  Pt would like to leave it in for now.  Advised we would send him a return kit for his bedside monitor and discontinue monitoring.  Pt thanked nurse for call.

## 2022-08-09 DIAGNOSIS — L299 Pruritus, unspecified: Secondary | ICD-10-CM | POA: Diagnosis not present

## 2022-08-09 DIAGNOSIS — L2089 Other atopic dermatitis: Secondary | ICD-10-CM | POA: Diagnosis not present

## 2022-09-09 DIAGNOSIS — E669 Obesity, unspecified: Secondary | ICD-10-CM | POA: Diagnosis not present

## 2022-09-09 DIAGNOSIS — I10 Essential (primary) hypertension: Secondary | ICD-10-CM | POA: Diagnosis not present

## 2022-09-09 DIAGNOSIS — I25810 Atherosclerosis of coronary artery bypass graft(s) without angina pectoris: Secondary | ICD-10-CM | POA: Diagnosis not present

## 2022-09-09 DIAGNOSIS — E785 Hyperlipidemia, unspecified: Secondary | ICD-10-CM | POA: Diagnosis not present

## 2022-09-09 DIAGNOSIS — I2510 Atherosclerotic heart disease of native coronary artery without angina pectoris: Secondary | ICD-10-CM | POA: Diagnosis not present

## 2022-09-14 ENCOUNTER — Encounter: Payer: BC Managed Care – PPO | Admitting: Family Medicine

## 2022-09-14 NOTE — Progress Notes (Signed)
No show  This encounter was created in error - please disregard.

## 2022-09-16 ENCOUNTER — Encounter: Payer: BC Managed Care – PPO | Admitting: Family Medicine

## 2022-10-11 DIAGNOSIS — L821 Other seborrheic keratosis: Secondary | ICD-10-CM | POA: Diagnosis not present

## 2022-10-11 DIAGNOSIS — D225 Melanocytic nevi of trunk: Secondary | ICD-10-CM | POA: Diagnosis not present

## 2022-10-11 DIAGNOSIS — L578 Other skin changes due to chronic exposure to nonionizing radiation: Secondary | ICD-10-CM | POA: Diagnosis not present

## 2022-10-11 DIAGNOSIS — L570 Actinic keratosis: Secondary | ICD-10-CM | POA: Diagnosis not present

## 2022-10-11 DIAGNOSIS — L814 Other melanin hyperpigmentation: Secondary | ICD-10-CM | POA: Diagnosis not present

## 2022-12-11 NOTE — Assessment & Plan Note (Signed)
Check labs 

## 2022-12-11 NOTE — Progress Notes (Unsigned)
Subjective:  Patient ID: Daniel Mann, male    DOB: 02-06-1951  Age: 72 y.o. MRN: [id number]  Chief Complaint  Patient presents with   Prediabetes   Hypertension    HPI Hyperlipidemia: Current medications: Repatha 140 mg every 14 days.Repatha will need a PA  Hypertension: Complications: CORONARY ARTERY DISEASE/Obesity. Current medications: Metoprolol 25 mg twice a day, Lisinopril 20 mg daily.  GERD: Nexium 40 mg daily.  CAD: Repatha 140 mg every 14 days, aspirin 325 mg daily, metoprolol 25 mg twice daily.  Patient sees Dr. Johnsie Cancel.   Vitamin D: Vitamin D 50K twice a week.   Diet: healthy. Exercise: Active at work.      11/30/2021    7:34 AM 04/22/2021    9:21 AM 12/10/2020    5:53 PM 02/27/2020    8:32 AM  Depression screen PHQ 2/9  Decreased Interest 0 0 1 1  Down, Depressed, Hopeless 0 0 1 0  PHQ - 2 Score 0 0 2 1  Altered sleeping   1   Tired, decreased energy   1   Change in appetite   0   Feeling bad or failure about yourself    0   Trouble concentrating   0   Moving slowly or fidgety/restless   0   Suicidal thoughts   0   PHQ-9 Score   4   Difficult doing work/chores   Not difficult at all          10/02/2019    7:16 AM 02/27/2020    8:30 AM 12/10/2020    5:53 PM 04/22/2021    9:21 AM 11/30/2021    7:34 AM  Fall Risk  Falls in the past year?  0 0 0 0  Was there an injury with Fall?  0 0 0 0  Fall Risk Category Calculator  0 0 0 0  Fall Risk Category (Retired)  Low Low Low Low  (RETIRED) Patient Fall Risk Level Moderate fall risk Low fall risk  Low fall risk   Patient at Risk for Falls Due to  No Fall Risks History of fall(s);No Fall Risks No Fall Risks   Fall risk Follow up  Falls prevention discussed Falls evaluation completed;Follow up appointment Falls evaluation completed Falls evaluation completed      Review of Systems  Constitutional:  Negative for chills and fever.  HENT:  Positive for congestion and voice change (hoarse). Negative for ear  pain, sinus pressure, sinus pain and sore throat.   Respiratory:  Positive for cough. Negative for shortness of breath.   Cardiovascular:  Negative for chest pain.  Gastrointestinal:  Negative for abdominal pain, constipation, diarrhea, nausea and vomiting.  Endocrine: Negative for polydipsia, polyphagia and polyuria.  Genitourinary:  Negative for dysuria.  Musculoskeletal:  Negative for arthralgias, back pain and myalgias.  Neurological:  Negative for dizziness and headaches.  Psychiatric/Behavioral:  Negative for dysphoric mood. The patient is not nervous/anxious.     Current Outpatient Medications on File Prior to Visit  Medication Sig Dispense Refill   acetaminophen (TYLENOL) 325 MG tablet Take 2 tablets (650 mg total) by mouth every 6 (six) hours as needed for mild pain.     aspirin 325 MG tablet Take 325 mg by mouth daily.     Multiple Vitamin (MULTIVITAMIN) tablet Take 1 tablet by mouth daily.     triamcinolone cream (KENALOG) 0.1 % Apply 1 application. topically 2 (two) times daily. 80 g 1   No current facility-administered medications on  file prior to visit.   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 ischemic bowel.   COLONOSCOPY     CORONARY ARTERY BYPASS GRAFT N/A 07/12/2018   Procedure: CORONARY ARTERY BYPASS GRAFTING (CABG) times three using left internal mammary artery and right greater saphenous vein harvested endoscopically.;  Surgeon: Gaye Pollack, MD;  Location: [facility];  Service: Open Heart Surgery;  Laterality: N/A;   INSERTION OF MESH  10/02/2019   Procedure: INSERTION OF MESH;  Surgeon: Donnie Mesa, MD;  Location: [facility];  Service: General;;   LOOP RECORDER INSERTION N/A 10/09/2018   Procedure: LOOP RECORDER INSERTION;  Surgeon: Thompson Grayer, MD;  Location: [facility];  Service: Cardiovascular;  Laterality: N/A;   RIGHT/LEFT HEART CATH AND CORONARY ANGIOGRAPHY N/A 07/02/2018   Procedure: RIGHT/LEFT HEART CATH AND CORONARY ANGIOGRAPHY;  Surgeon: Belva Crome, MD;  Location: [facility];  Service: Cardiovascular;  Laterality: N/A;   TEE WITHOUT CARDIOVERSION N/A 07/12/2018   Procedure: TRANSESOPHAGEAL ECHOCARDIOGRAM (TEE);  Surgeon: Gaye Pollack, MD;  Location: [facility];  Service: Open Heart Surgery;  Laterality: N/A;   UMBILICAL HERNIA REPAIR N/A 10/02/2019   Procedure: UMBILICAL HERNIA REPAIR WITH MESH;  Surgeon: Donnie Mesa, MD;  Location: [facility];  Service: General;  Laterality: N/A;    Family History  Problem Relation Age of Onset   Alzheimer's disease Mother    Renal Disease Father    Cancer - Prostate Father    Social History   Socioeconomic History   Marital status: Widowed    Spouse name: Not on file   Number of children: 2   Years of education: Not on file   Highest education level: Not on file  Occupational History    Comment: [employer] in [place]  Tobacco Use   Smoking status: Former   Smokeless tobacco: Never  Scientific laboratory technician Use: Never used  Substance and Sexual Activity    Alcohol use: Yes    Comment: WINE occais   Drug use: No   Sexual activity: Not on file  Other Topics Concern   Not on file  Social History Narrative   Not on file   Social Determinants of Health   Financial Resource Strain: Not on file  Food Insecurity: Not on file  Transportation Needs: Not on file  Physical Activity: Not on file  Stress: Not on file  Social Connections: Not on file  Objective:  BP 110/64   Pulse 60   Temp (!) 96.8 F (36 C)   Resp 16   Ht 6' (1.829 m)   Wt 249 lb (112.9 kg)   BMI 33.77 kg/m      12/12/2022    7:49 AM 12/12/2022    7:33 AM 06/09/2022    8:02 AM  BP/Weight  Systolic BP [id number]  Diastolic BP 64 64 64  Wt. (Lbs)  249   BMI  33.77 kg/m2     Physical Exam Vitals reviewed.  Constitutional:      Appearance: Normal appearance. He is obese.  Neck:     Vascular: No carotid bruit.  Cardiovascular:     Rate and Rhythm: Normal rate and regular rhythm.     Heart sounds: Normal heart sounds.  Pulmonary:     Effort: Pulmonary effort is normal.     Breath sounds: Normal breath sounds. No wheezing, rhonchi or rales.  Abdominal:     General: Bowel sounds are normal.     Palpations: Abdomen is soft.     Tenderness: There is no abdominal tenderness.  Neurological:     Mental Status: He is alert and oriented to person, place, and time.  Psychiatric:        Mood and Affect: Mood normal.        Behavior: Behavior normal.     Diabetic Foot Exam - Simple   No data filed      Lab Results  Component Value Date   WBC 7.7 06/09/2022   HGB 15.4 06/09/2022   HCT 45.1 06/09/2022   PLT 289 06/09/2022   GLUCOSE 102 (H) 06/09/2022   CHOL 114 06/09/2022   TRIG 134 06/09/2022   HDL 53 06/09/2022   LDLCALC 38 06/09/2022   ALT 28 06/09/2022   AST 24 06/09/2022   NA 137 06/09/2022   K 5.2 06/09/2022   CL 99 06/09/2022   CREATININE 0.86 06/09/2022   BUN 15 06/09/2022   CO2 26 06/09/2022   TSH 2.180 06/16/2020   INR 1.20  07/20/2018   HGBA1C 6.0 (H) 06/09/2022      Assessment & Plan:    Hypertensive heart disease without heart failure Assessment & Plan: Well controlled.  No changes to medicines. Metoprolol 25 mg twice a day, Lisinopril 20 mg daily. Continue to work on eating a healthy diet and exercise.  Labs drawn today.   Orders: -     Comprehensive metabolic panel -     CBC with Differential/Platelet -     Metoprolol Tartrate; Take 1 tablet (25 mg total) by mouth 2 (two) times daily.  Dispense: 180 tablet; Refill: 1 -     Lisinopril; Take 1 tablet (20 mg total) by mouth daily.  Dispense: 90 tablet; Refill: 1  Coronary artery disease involving native coronary artery of native heart without angina pectoris Assessment & Plan: The current medical regimen is effective;  continue present plan and medications. Repatha 140 mg every 14 days, aspirin 325 mg daily, metoprolol 25 mg twice daily.    Orders: -     Repatha SureClick; INJECT 1 EACH INTO THE SKIN EVERY 14 (FOURTEEN) DAYS.  Dispense: 6 mL; Refill: 1  Gastroesophageal reflux disease without esophagitis Assessment & Plan: Continue Nexium 40 mg daily.  The current medical regimen is effective;  continue present plan and medications.  Orders: -     Esomeprazole Magnesium; Take 1 capsule (40 mg total) by mouth daily.  Dispense: 90 capsule;  Refill: 1  Mixed hyperlipidemia Assessment & Plan: Well controlled.  No changes to medicines. Repatha 140 mg every 14 days. Continue to work on eating a healthy diet and exercise.  Labs drawn today.   Orders: -     Lipid panel -     Repatha SureClick; INJECT 1 EACH INTO THE SKIN EVERY 14 (FOURTEEN) DAYS.  Dispense: 6 mL; Refill: 1  Prediabetes Assessment & Plan: Hemoglobin A1c 6.0%, 3 month avg of blood sugars, is in prediabetic range.  In order to prevent progression to diabetes, recommend low carb diet and regular exercise   Orders: -     Hemoglobin A1c  Vitamin D deficiency Assessment &  Plan: Check labs.  Orders: -     VITAMIN D 25 Hydroxy (Vit-D Deficiency, Fractures)  Presence of xenogenic heart valve  Class 1 obesity due to excess calories with serious comorbidity and body mass index (BMI) of 32.0 to 32.9 in adult Assessment & Plan: Recommend continue to work on eating healthy diet and exercise. Comorbidities: CORONARY ARTERY DISEASE, HYPERLIPIDEMIA.  Orders: -     Vitamin D (Ergocalciferol); TAKE 1 CAPSULE(S) BY MOUTH TWICE A WEEK  Dispense: 24 capsule; Refill: 1  Acute non-recurrent sinusitis of other sinus Assessment & Plan: Continue xyzal.  Sent amoxicillin in case progresses to sinus infection.  Orders: -     Amoxicillin; Take 1 tablet (875 mg total) by mouth 2 (two) times daily for 10 days.  Dispense: 20 tablet; Refill: 0  Class 2 severe obesity due to excess calories with serious comorbidity and body mass index (BMI) of 35.0 to 35.9 in adult Assessment & Plan: Recommend continue to work on eating healthy diet and exercise. Comorbidities: CORONARY ARTERY DISEASE, HYPERLIPIDEMIA.      Meds ordered this encounter  Medications   Evolocumab (REPATHA SURECLICK) [id number] MG/ML SOAJ    Sig: INJECT 1 EACH INTO THE SKIN EVERY 14 (FOURTEEN) DAYS.    Dispense:  6 mL    Refill:  1   DISCONTD: Vitamin D, Ergocalciferol, (DRISDOL) 1.25 MG (50000 UNIT) CAPS capsule    Sig: TAKE 1 CAPSULE(S) BY MOUTH TWICE A WEEK    Dispense:  24 capsule    Refill:  1   DISCONTD: metoprolol tartrate (LOPRESSOR) 25 MG tablet    Sig: Take 1 tablet (25 mg total) by mouth 2 (two) times daily.    Dispense:  180 tablet    Refill:  1   DISCONTD: lisinopril (ZESTRIL) 20 MG tablet    Sig: Take 1 tablet (20 mg total) by mouth daily.    Dispense:  90 tablet    Refill:  1   DISCONTD: esomeprazole (NEXIUM) 40 MG capsule    Sig: Take 1 capsule (40 mg total) by mouth daily.    Dispense:  90 capsule    Refill:  1   amoxicillin (AMOXIL) 875 MG tablet    Sig: Take 1 tablet (875 mg total) by  mouth 2 (two) times daily for 10 days.    Dispense:  20 tablet    Refill:  0   Vitamin D, Ergocalciferol, (DRISDOL) 1.25 MG (50000 UNIT) CAPS capsule    Sig: TAKE 1 CAPSULE(S) BY MOUTH TWICE A WEEK    Dispense:  24 capsule    Refill:  1   metoprolol tartrate (LOPRESSOR) 25 MG tablet    Sig: Take 1 tablet (25 mg total) by mouth 2 (two) times daily.    Dispense:  180 tablet    Refill:  1  lisinopril (ZESTRIL) 20 MG tablet    Sig: Take 1 tablet (20 mg total) by mouth daily.    Dispense:  90 tablet    Refill:  1   esomeprazole (NEXIUM) 40 MG capsule    Sig: Take 1 capsule (40 mg total) by mouth daily.    Dispense:  90 capsule    Refill:  1    Orders Placed This Encounter  Procedures   Comprehensive metabolic panel   Hemoglobin A1c   Lipid panel   CBC with Differential/Platelet   VITAMIN D 25 Hydroxy (Vit-D Deficiency, Fractures)     Follow-up: Return in about 6 months (around 06/13/2023) for chronic fasting, awv.   I,Marla I Leal-Borjas,acting as a scribe for Rochel Brome, MD.,have documented all relevant documentation on the behalf of Rochel Brome, MD,as directed by  Rochel Brome, MD while in the presence of Rochel Brome, MD.   An After Visit Summary was printed and given to the patient.  I attest that I have reviewed this visit and agree with the plan scribed by my staff.   Rochel Brome, MD [facility] [phone]

## 2022-12-11 NOTE — Assessment & Plan Note (Signed)
The current medical regimen is effective;  continue present plan and medications. Repatha 140 mg every 14 days, aspirin 325 mg daily, metoprolol 25 mg twice daily. 

## 2022-12-11 NOTE — Assessment & Plan Note (Signed)
Well controlled.  No changes to medicines. Repatha 140 mg every 14 days. Continue to work on eating a healthy diet and exercise.  Labs drawn today.   

## 2022-12-11 NOTE — Assessment & Plan Note (Signed)
Hemoglobin A1c 6.0%, 3 month avg of blood sugars, is in prediabetic range.  In order to prevent progression to diabetes, recommend low carb diet and regular exercise 

## 2022-12-11 NOTE — Assessment & Plan Note (Signed)
Continue Nexium 40 mg daily.  The current medical regimen is effective;  continue present plan and medications. 

## 2022-12-11 NOTE — Assessment & Plan Note (Signed)
Well controlled.  No changes to medicines. Metoprolol 25 mg twice a day, Lisinopril 20 mg daily. Continue to work on eating a healthy diet and exercise.  Labs drawn today.  

## 2022-12-12 ENCOUNTER — Encounter: Payer: Self-pay | Admitting: Family Medicine

## 2022-12-12 ENCOUNTER — Ambulatory Visit (INDEPENDENT_AMBULATORY_CARE_PROVIDER_SITE_OTHER): Payer: PPO | Admitting: Family Medicine

## 2022-12-12 VITALS — BP 110/64 | HR 60 | Temp 96.8°F | Resp 16 | Ht 72.0 in | Wt 249.0 lb

## 2022-12-12 DIAGNOSIS — R7303 Prediabetes: Secondary | ICD-10-CM

## 2022-12-12 DIAGNOSIS — I119 Hypertensive heart disease without heart failure: Secondary | ICD-10-CM

## 2022-12-12 DIAGNOSIS — J019 Acute sinusitis, unspecified: Secondary | ICD-10-CM | POA: Insufficient documentation

## 2022-12-12 DIAGNOSIS — Z953 Presence of xenogenic heart valve: Secondary | ICD-10-CM | POA: Diagnosis not present

## 2022-12-12 DIAGNOSIS — K219 Gastro-esophageal reflux disease without esophagitis: Secondary | ICD-10-CM | POA: Diagnosis not present

## 2022-12-12 DIAGNOSIS — E782 Mixed hyperlipidemia: Secondary | ICD-10-CM

## 2022-12-12 DIAGNOSIS — Z6832 Body mass index (BMI) 32.0-32.9, adult: Secondary | ICD-10-CM

## 2022-12-12 DIAGNOSIS — Z6833 Body mass index (BMI) 33.0-33.9, adult: Secondary | ICD-10-CM | POA: Diagnosis not present

## 2022-12-12 DIAGNOSIS — E559 Vitamin D deficiency, unspecified: Secondary | ICD-10-CM | POA: Diagnosis not present

## 2022-12-12 DIAGNOSIS — I2510 Atherosclerotic heart disease of native coronary artery without angina pectoris: Secondary | ICD-10-CM

## 2022-12-12 DIAGNOSIS — J018 Other acute sinusitis: Secondary | ICD-10-CM

## 2022-12-12 MED ORDER — VITAMIN D (ERGOCALCIFEROL) 1.25 MG (50000 UNIT) PO CAPS: ORAL_CAPSULE | ORAL | 1 refills | Status: DC

## 2022-12-12 MED ORDER — LISINOPRIL 20 MG PO TABS
20.0000 mg | ORAL_TABLET | Freq: Every day | ORAL | 1 refills | Status: AC
Start: 2022-12-12 — End: ?

## 2022-12-12 MED ORDER — ESOMEPRAZOLE MAGNESIUM 40 MG PO CPDR
40.0000 mg | DELAYED_RELEASE_CAPSULE | Freq: Every day | ORAL | 1 refills | Status: DC
Start: 2022-12-12 — End: 2023-06-04

## 2022-12-12 MED ORDER — REPATHA SURECLICK 140 MG/ML ~~LOC~~ SOAJ: SUBCUTANEOUS | 1 refills | Status: DC

## 2022-12-12 MED ORDER — METOPROLOL TARTRATE 25 MG PO TABS
25.0000 mg | ORAL_TABLET | Freq: Two times a day (BID) | ORAL | 1 refills | Status: DC
Start: 2022-12-12 — End: 2023-06-04

## 2022-12-12 MED ORDER — ESOMEPRAZOLE MAGNESIUM 40 MG PO CPDR: 40.0000 mg | DELAYED_RELEASE_CAPSULE | Freq: Every day | ORAL | 1 refills | Status: DC

## 2022-12-12 MED ORDER — LISINOPRIL 20 MG PO TABS: 20.0000 mg | ORAL_TABLET | Freq: Every day | ORAL | 1 refills | Status: DC

## 2022-12-12 MED ORDER — METOPROLOL TARTRATE 25 MG PO TABS: 25.0000 mg | ORAL_TABLET | Freq: Two times a day (BID) | ORAL | 1 refills | Status: DC

## 2022-12-12 MED ORDER — AMOXICILLIN 875 MG PO TABS: 875.0000 mg | ORAL_TABLET | Freq: Two times a day (BID) | ORAL | 0 refills | Status: AC

## 2022-12-12 NOTE — Assessment & Plan Note (Signed)
Continue xyzal.  Sent amoxicillin in case progresses to sinus infection.

## 2022-12-12 NOTE — Assessment & Plan Note (Signed)
Recommend continue to work on eating healthy diet and exercise. Comorbidities: CORONARY ARTERY DISEASE, HYPERLIPIDEMIA.

## 2022-12-13 LAB — COMPREHENSIVE METABOLIC PANEL
ALT: 29 IU/L (ref 0–44)
AST: 29 IU/L (ref 0–40)
Albumin/Globulin Ratio: 1.8 (ref 1.2–2.2)
Albumin: 4.2 g/dL (ref 3.8–4.8)
Alkaline Phosphatase: 95 IU/L (ref 44–121)
BUN/Creatinine Ratio: 21 (ref 10–24)
BUN: 17 mg/dL (ref 8–27)
Bilirubin Total: 0.3 mg/dL (ref 0.0–1.2)
CO2: 25 mmol/L (ref 20–29)
Calcium: 10.3 mg/dL — ABNORMAL HIGH (ref 8.6–10.2)
Chloride: 101 mmol/L (ref 96–106)
Creatinine, Ser: 0.8 mg/dL (ref 0.76–1.27)
Globulin, Total: 2.3 g/dL (ref 1.5–4.5)
Glucose: 106 mg/dL — ABNORMAL HIGH (ref 70–99)
Potassium: 4.9 mmol/L (ref 3.5–5.2)
Sodium: 140 mmol/L (ref 134–144)
Total Protein: 6.5 g/dL (ref 6.0–8.5)
eGFR: 94 mL/min/{1.73_m2} (ref >59)

## 2022-12-13 LAB — CBC WITH DIFFERENTIAL/PLATELET
Basophils Absolute: 0 10*3/uL (ref 0.0–0.2)
Basos: 1 %
EOS (ABSOLUTE): 0.3 10*3/uL (ref 0.0–0.4)
Eos: 3 %
Hematocrit: 44.7 % (ref 37.5–51.0)
Hemoglobin: 15.1 g/dL (ref 13.0–17.7)
Immature Grans (Abs): 0 10*3/uL (ref 0.0–0.1)
Immature Granulocytes: 0 %
Lymphocytes Absolute: 2.2 10*3/uL (ref 0.7–3.1)
Lymphs: 29 %
MCH: 32.3 pg (ref 26.6–33.0)
MCHC: 33.8 g/dL (ref 31.5–35.7)
MCV: 96 fL (ref 79–97)
Monocytes Absolute: 0.6 10*3/uL (ref 0.1–0.9)
Monocytes: 8 %
Neutrophils Absolute: 4.6 10*3/uL (ref 1.4–7.0)
Neutrophils: 59 %
Platelets: 281 10*3/uL (ref 150–450)
RBC: 4.67 x10E6/uL (ref 4.14–5.80)
RDW: 12.8 % (ref 11.6–15.4)
WBC: 7.8 10*3/uL (ref 3.4–10.8)

## 2022-12-13 LAB — LIPID PANEL
Chol/HDL Ratio: 2.2 ratio (ref 0.0–5.0)
Cholesterol, Total: 111 mg/dL (ref 100–199)
HDL: 51 mg/dL (ref >39)
LDL Chol Calc (NIH): 40 mg/dL (ref 0–99)
Triglycerides: 108 mg/dL (ref 0–149)
VLDL Cholesterol Cal: 20 mg/dL (ref 5–40)

## 2022-12-13 LAB — VITAMIN D 25 HYDROXY (VIT D DEFICIENCY, FRACTURES): Vit D, 25-Hydroxy: 95.2 ng/mL (ref 30.0–100.0)

## 2022-12-13 LAB — HEMOGLOBIN A1C
Est. average glucose Bld gHb Est-mCnc: 126 mg/dL
Hgb A1c MFr Bld: 6 % — ABNORMAL HIGH (ref 4.8–5.6)

## 2022-12-13 LAB — CARDIOVASCULAR RISK ASSESSMENT

## 2022-12-21 ENCOUNTER — Other Ambulatory Visit: Payer: Self-pay | Admitting: Family Medicine

## 2022-12-21 DIAGNOSIS — E782 Mixed hyperlipidemia: Secondary | ICD-10-CM

## 2022-12-21 DIAGNOSIS — I2510 Atherosclerotic heart disease of native coronary artery without angina pectoris: Secondary | ICD-10-CM

## 2023-01-16 DIAGNOSIS — L814 Other melanin hyperpigmentation: Secondary | ICD-10-CM | POA: Diagnosis not present

## 2023-03-21 ENCOUNTER — Other Ambulatory Visit: Payer: Self-pay | Admitting: Family Medicine

## 2023-03-21 DIAGNOSIS — E6609 Other obesity due to excess calories: Secondary | ICD-10-CM

## 2023-04-26 DIAGNOSIS — N401 Enlarged prostate with lower urinary tract symptoms: Secondary | ICD-10-CM | POA: Diagnosis not present

## 2023-04-26 DIAGNOSIS — R351 Nocturia: Secondary | ICD-10-CM | POA: Diagnosis not present

## 2023-04-26 DIAGNOSIS — N411 Chronic prostatitis: Secondary | ICD-10-CM | POA: Diagnosis not present

## 2023-06-04 ENCOUNTER — Other Ambulatory Visit: Payer: Self-pay | Admitting: Family Medicine

## 2023-06-04 DIAGNOSIS — K219 Gastro-esophageal reflux disease without esophagitis: Secondary | ICD-10-CM

## 2023-06-04 DIAGNOSIS — E782 Mixed hyperlipidemia: Secondary | ICD-10-CM

## 2023-06-04 DIAGNOSIS — I119 Hypertensive heart disease without heart failure: Secondary | ICD-10-CM

## 2023-06-04 DIAGNOSIS — I2510 Atherosclerotic heart disease of native coronary artery without angina pectoris: Secondary | ICD-10-CM

## 2023-06-05 ENCOUNTER — Other Ambulatory Visit: Payer: Self-pay | Admitting: Family Medicine

## 2023-06-05 DIAGNOSIS — E782 Mixed hyperlipidemia: Secondary | ICD-10-CM

## 2023-06-05 DIAGNOSIS — I2510 Atherosclerotic heart disease of native coronary artery without angina pectoris: Secondary | ICD-10-CM

## 2023-06-10 ENCOUNTER — Other Ambulatory Visit: Payer: Self-pay | Admitting: Family Medicine

## 2023-06-10 DIAGNOSIS — I119 Hypertensive heart disease without heart failure: Secondary | ICD-10-CM

## 2023-06-13 ENCOUNTER — Ambulatory Visit: Payer: PPO | Admitting: Family Medicine

## 2023-07-17 NOTE — Assessment & Plan Note (Signed)
Well controlled.  No changes to medicines. Metoprolol 25 mg twice a day, Lisinopril 20 mg daily. Continue to work on eating a healthy diet and exercise.  Labs drawn today.

## 2023-07-17 NOTE — Assessment & Plan Note (Signed)
Well controlled.  No changes to medicines. Repatha 140 mg every 14 days. Continue to work on eating a healthy diet and exercise.  Labs drawn today.

## 2023-07-17 NOTE — Assessment & Plan Note (Signed)
Hemoglobin A1c 6.0%, 3 month avg of blood sugars, is in prediabetic range.  In order to prevent progression to diabetes, recommend low carb diet and regular exercise 

## 2023-07-17 NOTE — Progress Notes (Unsigned)
Subjective:  Patient ID: Daniel Mann, male    DOB: 03/07/1951  Age: 72 y.o. MRN: [id number]  Chief Complaint  Patient presents with   Medical Management of Chronic Issues    HPI Hyperlipidemia: Current medications: Repatha 140 mg every 14 days.  Hypertension: Complications: CORONARY ARTERY DISEASE/Obesity. Current medications: Metoprolol 25 mg twice a day, Lisinopril 20 mg daily.  GERD: Nexium 40 mg daily.  CAD: Repatha 140 mg every 14 days, aspirin 325 mg daily, metoprolol 25 mg twice daily.  Patient sees Dr. Eden Emms.   Vitamin D: Vitamin D 50K twice a week.   Diet: healthy. Exercise: Active at work.       07/18/2023    2:03 PM 11/30/2021    7:34 AM 04/22/2021    9:21 AM 12/10/2020    5:53 PM 02/27/2020    8:32 AM  Depression screen PHQ 2/9  Decreased Interest 0 0 0 1 1  Down, Depressed, Hopeless 0 0 0 1 0  PHQ - 2 Score 0 0 0 2 1  Altered sleeping 0   1   Tired, decreased energy 0   1   Change in appetite 0   0   Feeling bad or failure about yourself  0   0   Trouble concentrating 0   0   Moving slowly or fidgety/restless 0   0   Suicidal thoughts 0   0   PHQ-9 Score 0   4   Difficult doing work/chores Not difficult at all   Not difficult at all         07/18/2023    2:03 PM  Fall Risk   Falls in the past year? 0  Number falls in past yr: 0  Injury with Fall? 0  Risk for fall due to : No Fall Risks  Follow up Falls evaluation completed    Patient Care Team: Blane Ohara, MD as PCP - General (Family Medicine) Wendall Stade, MD as PCP - Cardiology (Cardiology) [name], MD as Consulting Physician (Cardiothoracic Surgery)   Review of Systems  Constitutional:  Negative for chills, diaphoresis, fatigue and fever.  HENT:  Negative for congestion, ear pain and sore throat.        Thinks he has wax built up in his right ear.  Respiratory:  Negative for cough and shortness of breath.   Cardiovascular:  Negative for chest pain and leg swelling.   Gastrointestinal:  Negative for abdominal pain, constipation, diarrhea, nausea and vomiting.  Genitourinary:  Negative for dysuria and urgency.  Musculoskeletal:  Negative for arthralgias and myalgias.  Neurological:  Negative for dizziness and headaches.  Psychiatric/Behavioral:  Negative for dysphoric mood.     Current Outpatient Medications on File Prior to Visit  Medication Sig Dispense Refill   aspirin 325 MG tablet Take 325 mg by mouth daily.     Multiple Vitamin (MULTIVITAMIN) tablet Take 1 tablet by mouth daily.     No current facility-administered medications on file prior to visit.   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 ischemic bowel.   COLONOSCOPY     CORONARY ARTERY BYPASS GRAFT N/A 07/12/2018   Procedure: CORONARY ARTERY BYPASS GRAFTING (CABG) times three using left internal mammary artery and right greater saphenous vein harvested endoscopically.;  Surgeon: [name], MD;  Location: [facility] OR;  Service: Open Heart Surgery;  Laterality: N/A;   INSERTION OF MESH  10/02/2019    Procedure: INSERTION OF MESH;  Surgeon: Manus Rudd, MD;  Location: [facility];  Service: General;;   LOOP RECORDER INSERTION N/A 10/09/2018   Procedure: LOOP RECORDER INSERTION;  Surgeon: Hillis Range, MD;  Location: [facility];  Service: Cardiovascular;  Laterality: N/A;   RIGHT/LEFT HEART CATH AND CORONARY ANGIOGRAPHY N/A 07/02/2018   Procedure: RIGHT/LEFT HEART CATH AND CORONARY ANGIOGRAPHY;  Surgeon: Lyn Records, MD;  Location: [facility];  Service: Cardiovascular;  Laterality: N/A;   TEE WITHOUT CARDIOVERSION N/A 07/12/2018   Procedure: TRANSESOPHAGEAL ECHOCARDIOGRAM (TEE);  Surgeon: [name], MD;  Location: [facility] OR;  Service: Open Heart Surgery;  Laterality: N/A;   UMBILICAL HERNIA REPAIR N/A 10/02/2019   Procedure: UMBILICAL HERNIA REPAIR WITH MESH;  Surgeon: Manus Rudd, MD;  Location: [facility];  Service: General;  Laterality: N/A;    Family History  Problem Relation Age of Onset   Alzheimer's disease Mother    Renal Disease Father    Cancer - Prostate Father    Social History   Socioeconomic History   Marital status: Widowed    Spouse name: Not on file   Number of children: 2   Years of education: Not on file   Highest education level: Not on file  Occupational History    Comment: [employer] in [place]  Tobacco Use   Smoking status: Former   Smokeless tobacco: Never  Vaping Use   Vaping status: Never Used  Substance and Sexual Activity   Alcohol use: Yes    Comment: WINE occais   Drug use: No   Sexual activity: Not on file  Other Topics Concern   Not on file  Social History Narrative   Not on file   Social Determinants of Health   Financial Resource Strain: Low Risk  (07/18/2023)   Overall Financial Resource Strain (CARDIA)    Difficulty of Paying Living Expenses: Not hard at all  Food Insecurity: No Food Insecurity (07/18/2023)   Hunger Vital Sign    Worried About Running Out of Food in the Last  Year: Never true    Ran Out of Food in the Last Year: Never true  Transportation Needs: No Transportation Needs (07/18/2023)   PRAPARE - Administrator, Civil Service (Medical): No    Lack of Transportation (Non-Medical): No  Physical Activity: Sufficiently Active (07/18/2023)   Exercise Vital Sign    Days of Exercise per Week: 5 days    Minutes of Exercise per Session: 30 min  Stress: No Stress Concern Present (07/18/2023)   [employer] of Occupational Health - Occupational Stress Questionnaire    Feeling of Stress : Not at all  Social Connections: Socially Isolated (07/18/2023)   Social Connection and Isolation Panel [NHANES]    Frequency of Communication with Friends and Family: Three times a week    Frequency of Social Gatherings with Friends and Family: Three times a week    Attends Religious Services: Never    Active Member of Clubs or Organizations: No    Attends Banker Meetings: Never    Marital Status: Widowed    Objective:  BP 136/70   Pulse (!) 59   Temp (!) 97.1 F (36.2 C)   Ht 6' 1.8" (1.875 m)   Wt 252 lb (114.3 kg)   SpO2 97%   BMI 32.53 kg/m      07/18/2023    1:56 PM 12/12/2022    7:49 AM 12/12/2022    7:33 AM  BP/Weight  Systolic BP 136 110 140  Diastolic BP 70 64 64  Wt. (Lbs) 252  249  BMI 32.53 kg/m2  33.77 kg/m2    Physical Exam Vitals reviewed.  Constitutional:      Appearance: Normal appearance.  Neck:     Vascular: No carotid bruit.  Cardiovascular:     Rate and Rhythm: Normal rate and regular rhythm.     Heart sounds: Normal heart sounds.  Pulmonary:     Effort: Pulmonary effort is normal.     Breath sounds: Normal breath sounds. No wheezing, rhonchi or rales.  Abdominal:     General: Bowel sounds are normal.     Palpations: Abdomen is soft.     Tenderness: There is no abdominal tenderness.  Neurological:     Mental Status: He is alert and oriented to person, place, and time.  Psychiatric:        Mood  and Affect: Mood normal.        Behavior: Behavior normal.     Diabetic Foot Exam - Simple   No data filed      Lab Results  Component Value Date   WBC 7.9 07/18/2023   HGB 15.0 07/18/2023   HCT 45.2 07/18/2023   PLT 318 07/18/2023   GLUCOSE 114 (H) 07/18/2023   CHOL 120 07/18/2023   TRIG 227 (H) 07/18/2023   HDL 48 07/18/2023   LDLCALC 37 07/18/2023   ALT 31 07/18/2023   AST 28 07/18/2023   NA 140 07/18/2023   K 5.0 07/18/2023   CL 103 07/18/2023   CREATININE 0.81 07/18/2023   BUN 19 07/18/2023   CO2 25 07/18/2023   TSH 2.180 06/16/2020   INR 1.20 07/20/2018   HGBA1C 6.1 (H) 07/18/2023      Assessment & Plan:    Encounter for Medicare annual wellness exam Assessment & Plan: Things to do to keep yourself healthy  - Exercise at least 30-45 minutes a day, 3-4 days a week.  - Eat a low-fat diet with lots of fruits and vegetables, up to 7-9 servings per day.  - Seatbelts can save your life. Wear them always.  - Smoke detectors on every level of your home, check batteries every year.  - Eye Doctor - have an eye exam every 1-2 years  - Safe sex - if you may be exposed to STDs, use a condom.  - Alcohol -  If you drink, do it moderately, less than 2 drinks per day.  - Health Care  [employer]. Choose someone to speak for you if you are not able.  - Depression is common in our stressful world.If you're feeling down or losing interest in things you normally enjoy, please come in for a visit.  - Violence - If anyone is threatening or hurting you, please call immediately.    Hypertensive heart disease without heart failure Assessment & Plan: Well controlled.  No changes to medicines. Metoprolol 25 mg twice a day, Lisinopril 20 mg daily. Continue to work on eating a healthy diet and exercise.  Labs drawn today.   Orders: -     CBC with Differential/Platelet -     Comprehensive metabolic panel -     Lisinopril; Take 1 tablet (20 mg total) by mouth daily.   Dispense: 90 tablet; Refill: 1 -     Metoprolol Tartrate; Take 1 tablet (25 mg total) by mouth 2 (two) times daily.  Dispense: 180 tablet; Refill: 1  Mixed hyperlipidemia Assessment & Plan: Well controlled.  No changes to medicines. Repatha 140 mg every 14 days. Continue to work on eating a healthy diet and exercise.  Labs drawn today.   Orders: -     Lipid panel -     Repatha SureClick; INJECT 1 EACH INTO THE SKIN EVERY 14 (FOURTEEN) DAYS.  Dispense: 6 mL; Refill: 1  Prediabetes Assessment & Plan: Hemoglobin A1c 6.0%, 3 month avg of blood sugars, is in prediabetic range.  In order to prevent progression to diabetes, recommend low carb diet and regular exercise   Orders: -     Hemoglobin A1c  Gastroesophageal reflux disease without esophagitis Assessment & Plan: Continue Nexium 40 mg daily.  The current medical regimen is effective;  continue present plan and medications.  Orders: -     Esomeprazole Magnesium; Take 1 capsule (40 mg total) by mouth daily.  Dispense: 90 capsule; Refill: 1  Coronary artery disease involving native coronary artery of native heart without angina pectoris Assessment & Plan: The current medical regimen is effective;  continue present plan and medications. Repatha 140 mg every 14 days, aspirin 325 mg daily, metoprolol 25 mg twice daily.    Orders: -     Repatha SureClick; INJECT 1 EACH INTO THE SKIN EVERY 14 (FOURTEEN) DAYS.  Dispense: 6 mL; Refill: 1  Class 1 obesity due to excess calories with serious comorbidity and body mass index (BMI) of 32.0 to 32.9 in adult Assessment & Plan: Recommend continue to work on eating healthy diet and exercise.   Orders: -     Vitamin D (Ergocalciferol); TAKE 1 CAPSULE BY MOUTH TWICE PER WEEK  Dispense: 24 capsule; Refill: 1  Other orders -     Acetaminophen; Take 2 tablets (650 mg total) by mouth every 6 (six) hours as needed for mild pain (pain score 1-3). -     Triamcinolone Acetonide; Apply 1 Application  topically 2 (two) times daily.  Dispense: 80 g; Refill: 1     Meds ordered this encounter  Medications   acetaminophen (TYLENOL) 325 MG tablet    Sig: Take 2 tablets (650 mg total) by mouth every 6 (six) hours as needed for mild pain (pain score 1-3).   esomeprazole (NEXIUM) 40 MG capsule    Sig: Take 1 capsule (40 mg total) by mouth daily.    Dispense:  90 capsule    Refill:  1   Evolocumab (REPATHA SURECLICK) 140 MG/ML SOAJ    Sig: INJECT 1 EACH INTO THE SKIN EVERY 14 (FOURTEEN)  DAYS.    Dispense:  6 mL    Refill:  1   lisinopril (ZESTRIL) 20 MG tablet    Sig: Take 1 tablet (20 mg total) by mouth daily.    Dispense:  90 tablet    Refill:  1   metoprolol tartrate (LOPRESSOR) 25 MG tablet    Sig: Take 1 tablet (25 mg total) by mouth 2 (two) times daily.    Dispense:  180 tablet    Refill:  1   triamcinolone cream (KENALOG) 0.1 %    Sig: Apply 1 Application topically 2 (two) times daily.    Dispense:  80 g    Refill:  1   Vitamin D, Ergocalciferol, (DRISDOL) 1.25 MG (50000 UNIT) CAPS capsule    Sig: TAKE 1 CAPSULE BY MOUTH TWICE PER WEEK    Dispense:  24 capsule    Refill:  1    Orders Placed This Encounter  Procedures   CBC with Differential/Platelet   Comprehensive metabolic panel   Hemoglobin A1c   Lipid panel     Follow-up: Return in 1 year (on 07/17/2024).   I,Marla I Leal-Borjas,acting as a scribe for Blane Ohara, MD.,have documented all relevant documentation on the behalf of Blane Ohara, MD,as directed by  Blane Ohara, MD while in the presence of Blane Ohara, MD.   An After Visit Summary was printed and given to the patient.  I attest that I have reviewed this visit and agree with the plan scribed by my staff.   Blane Ohara, MD [facility] [phone]

## 2023-07-18 ENCOUNTER — Ambulatory Visit (INDEPENDENT_AMBULATORY_CARE_PROVIDER_SITE_OTHER): Payer: PPO | Admitting: Family Medicine

## 2023-07-18 ENCOUNTER — Encounter: Payer: Self-pay | Admitting: Family Medicine

## 2023-07-18 VITALS — BP 136/70 | HR 59 | Temp 97.1°F | Ht 73.8 in | Wt 252.0 lb

## 2023-07-18 DIAGNOSIS — E782 Mixed hyperlipidemia: Secondary | ICD-10-CM | POA: Diagnosis not present

## 2023-07-18 DIAGNOSIS — R7303 Prediabetes: Secondary | ICD-10-CM | POA: Diagnosis not present

## 2023-07-18 DIAGNOSIS — Z6832 Body mass index (BMI) 32.0-32.9, adult: Secondary | ICD-10-CM | POA: Diagnosis not present

## 2023-07-18 DIAGNOSIS — E66811 Obesity, class 1: Secondary | ICD-10-CM

## 2023-07-18 DIAGNOSIS — E6609 Other obesity due to excess calories: Secondary | ICD-10-CM | POA: Diagnosis not present

## 2023-07-18 DIAGNOSIS — K219 Gastro-esophageal reflux disease without esophagitis: Secondary | ICD-10-CM | POA: Diagnosis not present

## 2023-07-18 DIAGNOSIS — I2510 Atherosclerotic heart disease of native coronary artery without angina pectoris: Secondary | ICD-10-CM | POA: Diagnosis not present

## 2023-07-18 DIAGNOSIS — Z0000 Encounter for general adult medical examination without abnormal findings: Secondary | ICD-10-CM | POA: Diagnosis not present

## 2023-07-18 DIAGNOSIS — I119 Hypertensive heart disease without heart failure: Secondary | ICD-10-CM

## 2023-07-18 MED ORDER — VITAMIN D (ERGOCALCIFEROL) 1.25 MG (50000 UNIT) PO CAPS: ORAL_CAPSULE | ORAL | 1 refills | Status: DC

## 2023-07-18 MED ORDER — TRIAMCINOLONE ACETONIDE 0.1 % EX CREA: 1.0000 | TOPICAL_CREAM | Freq: Two times a day (BID) | CUTANEOUS | 1 refills | Status: AC

## 2023-07-18 MED ORDER — REPATHA SURECLICK 140 MG/ML ~~LOC~~ SOAJ: SUBCUTANEOUS | 1 refills | Status: DC

## 2023-07-18 MED ORDER — LISINOPRIL 20 MG PO TABS: 20.0000 mg | ORAL_TABLET | Freq: Every day | ORAL | 1 refills | Status: DC

## 2023-07-18 MED ORDER — ACETAMINOPHEN 325 MG PO TABS: 650.0000 mg | ORAL_TABLET | Freq: Four times a day (QID) | ORAL | Status: AC | PRN

## 2023-07-18 MED ORDER — ESOMEPRAZOLE MAGNESIUM 40 MG PO CPDR: 40.0000 mg | DELAYED_RELEASE_CAPSULE | Freq: Every day | ORAL | 1 refills | Status: DC

## 2023-07-18 MED ORDER — METOPROLOL TARTRATE 25 MG PO TABS: 25.0000 mg | ORAL_TABLET | Freq: Two times a day (BID) | ORAL | 1 refills | Status: DC

## 2023-07-18 NOTE — Progress Notes (Signed)
Subjective:   Daniel Mann is a 72 y.o. male who presents for Medicare Annual/Subsequent preventive examination.  Visit Complete: In person  Patient Medicare AWV questionnaire was completed by the patient; I have confirmed that all information answered by patient is correct and no changes since this date.  Cardiac Risk Factors include: advanced age (>22men, >40 women);hypertension     Objective:    Today's Vitals   07/18/23 1356  BP: 136/70  Pulse: (!) 59  Temp: (!) 97.1 F (36.2 C)  SpO2: 97%  Weight: 252 lb (114.3 kg)  Height: 6' 1.8" (1.875 m)   Body mass index is 32.53 kg/m.     07/18/2023    2:34 PM 10/02/2019    7:06 AM 09/25/2019    3:08 PM 10/09/2018    6:55 AM 07/21/2018    1:00 AM 07/20/2018    2:59 PM 07/13/2018   12:00 AM  Advanced Directives  Does Patient Have a Medical Advance Directive? Yes Yes Yes Yes  Yes Yes  Type of Estate agent of [place];Living will Healthcare Power of [place];Living will Living will;Healthcare Power of [employer] Power of [place];Living will Healthcare Power of [place];Living will  Healthcare Power of [place];Living will  Does patient want to make changes to medical advance directive? No - Patient declined No - Patient declined  No - Patient declined No - Patient declined Yes (ED - Information included in AVS) No - Patient declined  Copy of Healthcare Power of Attorney in Chart? No - copy requested No - copy requested  No - copy requested No - copy requested  No - copy requested    Current Medications (verified) Outpatient Encounter Medications as of 07/18/2023  Medication Sig   acetaminophen (TYLENOL) 325 MG tablet Take 2 tablets (650 mg total) by mouth every 6 (six) hours as needed for mild pain (pain score 1-3).   aspirin 325 MG tablet Take 325 mg by mouth daily.   esomeprazole (NEXIUM) 40 MG capsule Take 1 capsule (40 mg total) by mouth daily.   Evolocumab (REPATHA SURECLICK) 140 MG/ML SOAJ INJECT  1 EACH INTO THE SKIN EVERY 14 (FOURTEEN) DAYS.   lisinopril (ZESTRIL) 20 MG tablet Take 1 tablet (20 mg total) by mouth daily.   metoprolol tartrate (LOPRESSOR) 25 MG tablet Take 1 tablet (25 mg total) by mouth 2 (two) times daily.   Multiple Vitamin (MULTIVITAMIN) tablet Take 1 tablet by mouth daily.   triamcinolone cream (KENALOG) 0.1 % Apply 1 Application topically 2 (two) times daily.   Vitamin D, Ergocalciferol, (DRISDOL) 1.25 MG (50000 UNIT) CAPS capsule TAKE 1 CAPSULE BY MOUTH TWICE PER WEEK   [DISCONTINUED] acetaminophen (TYLENOL) 325 MG tablet Take 2 tablets (650 mg total) by mouth every 6 (six) hours as needed for mild pain.   [DISCONTINUED] esomeprazole (NEXIUM) 40 MG capsule TAKE 1 CAPSULE (40 MG TOTAL) BY MOUTH DAILY.   [DISCONTINUED] Evolocumab (REPATHA SURECLICK) 140 MG/ML SOAJ INJECT 1 EACH INTO THE SKIN EVERY 14 (FOURTEEN) DAYS.   [DISCONTINUED] lisinopril (ZESTRIL) 20 MG tablet TAKE 1 TABLET BY MOUTH EVERY DAY   [DISCONTINUED] metoprolol tartrate (LOPRESSOR) 25 MG tablet TAKE 1 TABLET BY MOUTH TWICE A DAY   [DISCONTINUED] triamcinolone cream (KENALOG) 0.1 % Apply 1 application. topically 2 (two) times daily.   [DISCONTINUED] Vitamin D, Ergocalciferol, (DRISDOL) 1.25 MG (50000 UNIT) CAPS capsule TAKE 1 CAPSULE BY MOUTH TWICE PER WEEK   No facility-administered encounter medications on file as of 07/18/2023.    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 ischemic bowel.   COLONOSCOPY     CORONARY ARTERY BYPASS GRAFT N/A 07/12/2018   Procedure: CORONARY ARTERY BYPASS GRAFTING (CABG) times three using left internal mammary artery and right greater saphenous vein harvested endoscopically.;  Surgeon: [name], MD;  Location: [facility] OR;  Service: Open Heart Surgery;  Laterality: N/A;   INSERTION OF MESH  10/02/2019   Procedure: INSERTION OF MESH;  Surgeon: Manus Rudd, MD;  Location: [facility];  Service: General;;   LOOP RECORDER INSERTION N/A 10/09/2018   Procedure: LOOP RECORDER INSERTION;  Surgeon: Hillis Range, MD;  [facility];  Service: Cardiovascular;  Laterality: N/A;   RIGHT/LEFT HEART CATH AND CORONARY ANGIOGRAPHY N/A 07/02/2018   Procedure: RIGHT/LEFT HEART CATH AND CORONARY ANGIOGRAPHY;  Surgeon: Lyn Records, MD;  [facility];  Service: Cardiovascular;  Laterality: N/A;   TEE WITHOUT CARDIOVERSION N/A 07/12/2018   Procedure: TRANSESOPHAGEAL ECHOCARDIOGRAM (TEE);  Surgeon: [name], MD;  Location: [facility] OR;  Service: Open Heart Surgery;  Laterality: N/A;   UMBILICAL HERNIA REPAIR N/A 10/02/2019   Procedure: UMBILICAL HERNIA REPAIR WITH MESH;  Surgeon: Manus Rudd, MD;  Location: [facility];  Service: General;  Laterality: N/A;   Family History  Problem Relation Age of Onset   Alzheimer's disease Mother    Renal Disease Father    Cancer - Prostate Father    Social History   Socioeconomic History   Marital status: Widowed    Spouse name: Not on  file   Number of children: 2   Years of education: Not on file   Highest education level: Not on file  Occupational History    Comment: [employer] in [place]  Tobacco Use   Smoking status: Former   Smokeless tobacco: Never  Vaping Use   Vaping status: Never Used  Substance and Sexual Activity   Alcohol use: Yes    Comment: WINE occais   Drug use: No   Sexual activity: Not on file  Other Topics Concern   Not on file  Social History Narrative   Not on file   Social Determinants of Health   Financial Resource Strain: Low Risk  (07/18/2023)   Overall Financial Resource Strain (CARDIA)    Difficulty of Paying Living Expenses: Not hard at all  Food Insecurity: No Food Insecurity (07/18/2023)   Hunger Vital Sign    Worried  About Running Out of Food in the Last Year: Never true    Ran Out of Food in the Last Year: Never true  Transportation Needs: No Transportation Needs (07/18/2023)   PRAPARE - Administrator, Civil Service (Medical): No    Lack of Transportation (Non-Medical): No  Physical Activity: Sufficiently Active (07/18/2023)   Exercise Vital Sign    Days of Exercise per Week: 5 days    Minutes of Exercise per Session: 30 min  Stress: No Stress Concern Present (07/18/2023)   [employer] of Occupational Health - Occupational Stress Questionnaire    Feeling of Stress : Not at all  Social Connections: Socially Isolated (07/18/2023)   Social Connection and Isolation Panel [NHANES]    Frequency of Communication with Friends and Family: Three times a week    Frequency of Social Gatherings with Friends and Family: Three times a week    Attends Religious Services: Never    Active Member of Clubs or Organizations: No    Attends Banker Meetings: Never    Marital Status: Widowed    Tobacco Counseling Counseling given: Not Answered   Clinical Intake:  Pre-visit preparation completed: No  Pain : No/denies pain     Nutritional Status: BMI >  30  Obese Nutritional Risks: None Diabetes: No  How often do you need to have someone help you when you read instructions, pamphlets, or other written materials from your doctor or pharmacy?: 1 - Never  Interpreter Needed?: No      Activities of Daily Living    07/18/2023    2:35 PM  In your present state of health, do you have any difficulty performing the following activities:  Hearing? 0  Vision? 0  Difficulty concentrating or making decisions? 0  Walking or climbing stairs? 0  Dressing or bathing? 0  Doing errands, shopping? 0  Preparing Food and eating ? N  Using the Toilet? N  In the past six months, have you accidently leaked urine? N  Do you have problems with loss of bowel control? N  Managing your Medications? N  Managing your Finances? N  Housekeeping or managing your Housekeeping? N    Patient Care Team: Blane Ohara, MD as PCP - General (Family Medicine) Wendall Stade, MD as PCP - Cardiology (Cardiology) [name], MD as Consulting Physician (Cardiothoracic Surgery)  Indicate any recent Medical Services you may have received from other than [facility] providers in the past year (date may be approximate).     Assessment:   This is a routine wellness examination for [facility].  Hearing/Vision screen No results found.   Goals Addressed   None   Depression Screen    07/18/2023    2:03 PM 11/30/2021    7:34 AM 04/22/2021    9:21 AM 12/10/2020    5:53 PM 02/27/2020    8:32 AM  PHQ 2/9 Scores  PHQ - 2 Score 0 0 0 2 1  PHQ- 9 Score 0   4     Fall Risk    07/18/2023    2:03 PM 11/30/2021    7:34 AM 04/22/2021    9:21 AM 12/10/2020    5:53 PM 02/27/2020    8:30 AM  Fall Risk   Falls in the past year? 0 0 0 0 0  Number falls in past yr: 0 0 0 0 0  Injury with Fall? 0 0 0 0 0  Risk for fall due to : No Fall Risks  No Fall Risks History of fall(s);No Fall Risks No Fall Risks  Follow up Falls evaluation completed Falls evaluation completed Falls evaluation  completed Falls evaluation completed;Follow up appointment Falls prevention discussed    MEDICARE RISK AT HOME: Medicare Risk at Home Any stairs in or around the home?: Yes If so, are there any without handrails?: Yes Home free of loose throw rugs in walkways, pet beds, electrical cords, etc?: Yes Adequate lighting in your home to reduce risk of falls?: Yes Life alert?: No Use of a cane, walker or w/c?: No Grab bars in the bathroom?: No Elevated toilet seat or a handicapped toilet?: No  TIMED UP AND GO:  Was the test performed?  Yes  Length of time to ambulate 10 feet: 2 sec Gait steady and fast without use of assistive device    Cognitive Function:    07/18/2023    2:35 PM  MMSE - Mini Mental State Exam  Orientation to time 5  Orientation to Place 5  Registration 3  Attention/ Calculation 3  Recall 3  Language- name 2 objects 2  Language- repeat 1  Language- follow 3 step command 3  Language- read & follow direction 1  Write a sentence 1  Copy design 1  Total score 28        Immunizations Immunization History  Administered Date(s) Administered   Fluad Quad(high Dose 65+) 06/16/2020, 06/09/2022   Influenza, High Dose Seasonal PF 06/02/2018   Influenza-Unspecified 06/01/2023   PFIZER Comirnaty(Gray Top)Covid-19 Tri-Sucrose Vaccine 12/10/2020   PFIZER(Purple Top)SARS-COV-2 Vaccination 10/02/2019, 10/16/2019, 06/25/2020   Pfizer Covid-19 Vaccine Bivalent Booster 58yrs & up 12/14/2021   Pfizer(Comirnaty)Fall Seasonal Vaccine 12 years and older 06/01/2023   Pneumococcal Conjugate-13 07/14/2015   Pneumococcal Polysaccharide-23 10/19/2016   RSV,unspecified 07/10/2023   Respiratory Syncytial Virus Vaccine,Recomb Aduvanted(Arexvy) 07/10/2023   Tdap 07/14/2015   Zoster Recombinant(Shingrix) 04/28/2023   Zoster, Live 08/15/2013     Screening Tests Health Maintenance  Topic Date Due   Hepatitis C Screening  Never done   Zoster Vaccines- Shingrix (2 of 2) 06/23/2023    COVID-19 Vaccine (7 - 2023-24 season) 10/01/2023   Medicare Annual Wellness (AWV)  07/17/2024   DTaP/Tdap/Td (2 - Td or Tdap) 07/13/2025   Colonoscopy  03/25/2030   Pneumonia Vaccine 17+ Years old  Completed   INFLUENZA VACCINE  Completed   HPV VACCINES  Aged Out    Health Maintenance  Health Maintenance Due  Topic Date Due   Hepatitis C Screening  Never done   Zoster Vaccines- Shingrix (2 of 2) 06/23/2023     Additional Screening:   Vision Screening: Recommended annual ophthalmology exams for early detection of glaucoma and other disorders of the eye. Is the patient up to date with their annual eye exam?  Yes  Who is the provider or what is the name of the office in which the patient attends annual eye exams? Doctor in [place] If pt is not established with a provider, would they like to be referred to a provider to establish care? No .   Dental Screening: Recommended annual dental exams for proper oral hygiene  Community Resource Referral / Chronic Care Management: CRR required this visit?  No   CCM required this visit?  No     Plan:     I have personally reviewed and noted the following in the patient's chart:   Medical and social history Use of alcohol, tobacco or illicit drugs  Current medications and supplements including opioid prescriptions. Patient is  not currently taking opioid prescriptions. Functional ability and status Nutritional status Physical activity Advanced directives List of other physicians Hospitalizations, surgeries, and ER visits in previous 12 months Vitals Screenings to include cognitive, depression, and falls Referrals and appointments  In addition, I have reviewed and discussed with patient certain preventive protocols, quality metrics, and best practice recommendations. A written personalized care plan for preventive services as well as general preventive health recommendations were provided to patient.    I attest that I have  reviewed this visit and agree with the plan scribed by my staff.   Blane Ohara, MD [facility] [phone]

## 2023-07-19 LAB — COMPREHENSIVE METABOLIC PANEL
ALT: 31 [IU]/L (ref 0–44)
AST: 28 [IU]/L (ref 0–40)
Albumin: 4.4 g/dL (ref 3.8–4.8)
Alkaline Phosphatase: 98 [IU]/L (ref 44–121)
BUN/Creatinine Ratio: 23 (ref 10–24)
BUN: 19 mg/dL (ref 8–27)
Bilirubin Total: <0.2 mg/dL (ref 0.0–1.2)
CO2: 25 mmol/L (ref 20–29)
Calcium: 10.4 mg/dL — ABNORMAL HIGH (ref 8.6–10.2)
Chloride: 103 mmol/L (ref 96–106)
Creatinine, Ser: 0.81 mg/dL (ref 0.76–1.27)
Globulin, Total: 2.3 g/dL (ref 1.5–4.5)
Glucose: 114 mg/dL — ABNORMAL HIGH (ref 70–99)
Potassium: 5 mmol/L (ref 3.5–5.2)
Sodium: 140 mmol/L (ref 134–144)
Total Protein: 6.7 g/dL (ref 6.0–8.5)
eGFR: 93 mL/min/{1.73_m2} (ref >59)

## 2023-07-19 LAB — CBC WITH DIFFERENTIAL/PLATELET
Basophils Absolute: 0.1 10*3/uL (ref 0.0–0.2)
Basos: 1 %
EOS (ABSOLUTE): 0.3 10*3/uL (ref 0.0–0.4)
Eos: 3 %
Hematocrit: 45.2 % (ref 37.5–51.0)
Hemoglobin: 15 g/dL (ref 13.0–17.7)
Immature Grans (Abs): 0 10*3/uL (ref 0.0–0.1)
Immature Granulocytes: 0 %
Lymphocytes Absolute: 2 10*3/uL (ref 0.7–3.1)
Lymphs: 26 %
MCH: 31.8 pg (ref 26.6–33.0)
MCHC: 33.2 g/dL (ref 31.5–35.7)
MCV: 96 fL (ref 79–97)
Monocytes Absolute: 0.7 10*3/uL (ref 0.1–0.9)
Monocytes: 9 %
Neutrophils Absolute: 4.8 10*3/uL (ref 1.4–7.0)
Neutrophils: 61 %
Platelets: 318 10*3/uL (ref 150–450)
RBC: 4.71 x10E6/uL (ref 4.14–5.80)
RDW: 11.7 % (ref 11.6–15.4)
WBC: 7.9 10*3/uL (ref 3.4–10.8)

## 2023-07-19 LAB — LIPID PANEL
Chol/HDL Ratio: 2.5 ratio (ref 0.0–5.0)
Cholesterol, Total: 120 mg/dL (ref 100–199)
HDL: 48 mg/dL (ref >39)
LDL Chol Calc (NIH): 37 mg/dL (ref 0–99)
Triglycerides: 227 mg/dL — ABNORMAL HIGH (ref 0–149)
VLDL Cholesterol Cal: 35 mg/dL (ref 5–40)

## 2023-07-19 LAB — HEMOGLOBIN A1C
Est. average glucose Bld gHb Est-mCnc: 128 mg/dL
Hgb A1c MFr Bld: 6.1 % — ABNORMAL HIGH (ref 4.8–5.6)

## 2023-07-20 ENCOUNTER — Ambulatory Visit: Payer: PPO

## 2023-07-20 ENCOUNTER — Telehealth: Payer: Self-pay

## 2023-07-20 NOTE — Telephone Encounter (Signed)
Copied from CRM [id number]. Topic: Clinical - Lab/Test Results >> Jul 20, 2023 10:10 AM Desma Mcgregor wrote: Reason for CRM: Patient wanting to know if test results are available cb# [phone]  Patient was informed about his labs results.

## 2023-07-22 DIAGNOSIS — E6609 Other obesity due to excess calories: Secondary | ICD-10-CM | POA: Insufficient documentation

## 2023-07-22 DIAGNOSIS — Z0000 Encounter for general adult medical examination without abnormal findings: Secondary | ICD-10-CM | POA: Insufficient documentation

## 2023-07-22 NOTE — Assessment & Plan Note (Signed)
Recommend continue to work on eating healthy diet and exercise.  

## 2023-07-22 NOTE — Assessment & Plan Note (Signed)

## 2023-07-22 NOTE — Assessment & Plan Note (Signed)
Continue Nexium 40 mg daily.  The current medical regimen is effective;  continue present plan and medications.

## 2023-07-22 NOTE — Assessment & Plan Note (Signed)
The current medical regimen is effective;  continue present plan and medications. Repatha 140 mg every 14 days, aspirin 325 mg daily, metoprolol 25 mg twice daily. 

## 2023-07-23 DIAGNOSIS — I480 Paroxysmal atrial fibrillation: Secondary | ICD-10-CM | POA: Insufficient documentation

## 2023-08-08 DIAGNOSIS — H10412 Chronic giant papillary conjunctivitis, left eye: Secondary | ICD-10-CM | POA: Diagnosis not present

## 2023-08-08 DIAGNOSIS — Z961 Presence of intraocular lens: Secondary | ICD-10-CM | POA: Diagnosis not present

## 2023-08-08 DIAGNOSIS — H524 Presbyopia: Secondary | ICD-10-CM | POA: Diagnosis not present

## 2023-08-08 DIAGNOSIS — H43393 Other vitreous opacities, bilateral: Secondary | ICD-10-CM | POA: Diagnosis not present

## 2023-08-08 DIAGNOSIS — H26493 Other secondary cataract, bilateral: Secondary | ICD-10-CM | POA: Diagnosis not present

## 2023-08-08 DIAGNOSIS — H11422 Conjunctival edema, left eye: Secondary | ICD-10-CM | POA: Diagnosis not present

## 2023-09-19 DIAGNOSIS — L814 Other melanin hyperpigmentation: Secondary | ICD-10-CM | POA: Diagnosis not present

## 2023-09-19 DIAGNOSIS — L821 Other seborrheic keratosis: Secondary | ICD-10-CM | POA: Diagnosis not present

## 2023-09-19 DIAGNOSIS — D225 Melanocytic nevi of trunk: Secondary | ICD-10-CM | POA: Diagnosis not present

## 2023-09-19 DIAGNOSIS — L570 Actinic keratosis: Secondary | ICD-10-CM | POA: Diagnosis not present

## 2023-09-19 DIAGNOSIS — L578 Other skin changes due to chronic exposure to nonionizing radiation: Secondary | ICD-10-CM | POA: Diagnosis not present

## 2023-10-23 NOTE — Progress Notes (Deleted)
 CARDIOLOGY CONSULT NOTE       Patient ID: Daniel Mann MRN: [id number] DOB/AGE: November 14, 1950 73 y.o.    Primary Physician: Blane Ohara, MD Primary Cardiologist: I have not seen in 5 years   HPI:  73 y.o. last seen by me in 12-23-2018. History of AS/CAD with AVR/CABG by Dr Laneta Simmers in December 22, 2017. Had 23 mm Edwards Inpiris bioprosthetic valve with LIMA to LAD, SVG OM, SVG to RCA. 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 Patient has elected to not have it removed. Wife and older son passed away in 2017-12-22. Seen by Dr Anne Fu in 12/22/2020 to clear for ventral hernia repair. Still working at [employer]. 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   LIMA to LAD SVG to CIRCUMFLEX SVG to RCA    Severe aortic stenosis 07/01/2018   Umbilical hernia    Vitamin D deficiency     Family History  Problem Relation Age of Onset   Alzheimer's disease Mother    Renal Disease Father    Cancer - Prostate Father     Social History   Socioeconomic History   Marital status: Widowed    Spouse name: Not on file   Number of children: 2   Years of education: Not on file   Highest education level: Not on file  Occupational History    Comment: [employer] in [place]  Tobacco Use   Smoking status: Former   Smokeless tobacco: Never  Vaping Use   Vaping status: Never Used  Substance and Sexual Activity   Alcohol use: Yes    Comment: WINE occais    Drug use: No   Sexual activity: Not on file  Other Topics Concern   Not on file  Social History Narrative   Not on file   Social Drivers of Health   Financial Resource Strain: Low Risk  (07/18/2023)   Overall Financial Resource Strain (CARDIA)    Difficulty of Paying Living Expenses: Not hard at all  Food Insecurity: No Food Insecurity (07/18/2023)   Hunger Vital Sign    Worried About Running Out of Food in the Last Year: Never true    Ran Out of Food in the Last Year: Never true  Transportation Needs: No Transportation Needs (07/18/2023)   PRAPARE - Administrator, Civil Service (Medical): No    Lack of Transportation (Non-Medical): No  Physical Activity: Sufficiently Active (07/18/2023)   Exercise Vital Sign    Days of Exercise per Week: 5 days    Minutes of Exercise per Session: 30 min  Stress: No Stress Concern Present (07/18/2023)   [employer] of Occupational Health - Occupational Stress Questionnaire    Feeling of Stress : Not at all  Social Connections: Socially Isolated (07/18/2023)   Social Connection and Isolation Panel [NHANES]  Frequency of Communication with Friends and Family: Three times a week    Frequency of Social Gatherings with Friends and Family: Three times a week    Attends Religious Services: Never    Active Member of Clubs or Organizations: No    Attends Banker Meetings: Never    Marital Status: Widowed  Intimate Partner Violence: Not At Risk (07/18/2023)   Humiliation, Afraid, Rape, and Kick questionnaire    Fear of Current or Ex-Partner: No    Emotionally Abused: No    Physically Abused: No    Sexually Abused: No    Past Surgical History:  Procedure Laterality Date   AORTIC VALVE REPLACEMENT N/A 07/12/2018   Procedure: AORTIC VALVE REPLACEMENT (AVR) using an Inspiris Valve size  23;  Surgeon: [name], MD;  Location: [facility] OR;  Service: Open Heart Surgery;  Laterality: N/A;   CATARACT EXTRACTION, BILATERAL      COLON SURGERY  04/2020   Rt hemicolectomy with reanastomosis. ischemic bowel.   COLONOSCOPY     CORONARY ARTERY BYPASS GRAFT N/A 07/12/2018   Procedure: CORONARY ARTERY BYPASS GRAFTING (CABG) times three using left internal mammary artery and right greater saphenous vein harvested endoscopically.;  Surgeon: [name], MD;  Location: [facility];  Service: Open Heart Surgery;  Laterality: N/A;   INSERTION OF MESH  10/02/2019   Procedure: INSERTION OF MESH;  Surgeon: Manus Rudd, MD;  Location: [facility];  Service: General;;   LOOP RECORDER INSERTION N/A 10/09/2018   Procedure: LOOP RECORDER INSERTION;  Surgeon: Hillis Range, MD;  [facility];  Service: Cardiovascular;  Laterality: N/A;   RIGHT/LEFT HEART CATH AND CORONARY ANGIOGRAPHY N/A 07/02/2018   Procedure: RIGHT/LEFT HEART CATH AND CORONARY ANGIOGRAPHY;  Surgeon: Lyn Records, MD;  [facility];  Service: Cardiovascular;  Laterality: N/A;   TEE WITHOUT CARDIOVERSION N/A 07/12/2018   Procedure: TRANSESOPHAGEAL ECHOCARDIOGRAM (TEE);  Surgeon: [name], MD;  Location: [facility];  Service: Open Heart Surgery;  Laterality: N/A;   UMBILICAL HERNIA REPAIR N/A 10/02/2019   Procedure: UMBILICAL HERNIA REPAIR WITH MESH;  Surgeon: Manus Rudd, MD;  Location: [facility];  Service: General;  Laterality: N/A;      Current Outpatient Medications:    acetaminophen (TYLENOL) 325 MG tablet, Take 2 tablets (650 mg total) by mouth every 6 (six) hours as needed for mild pain (pain score 1-3)., Disp: , Rfl:    aspirin 325 MG tablet, Take 325 mg by mouth daily., Disp: , Rfl:    esomeprazole (NEXIUM) 40 MG capsule, Take 1 capsule (40 mg total) by mouth daily., Disp: 90 capsule, Rfl: 1   Evolocumab (REPATHA SURECLICK) 140 MG/ML SOAJ, INJECT 1 EACH INTO THE SKIN EVERY 14 (FOURTEEN) DAYS., Disp: 6 mL, Rfl: 1   lisinopril (ZESTRIL) 20 MG tablet, Take 1 tablet (20 mg total) by mouth daily., Disp:  90 tablet, Rfl: 1   metoprolol tartrate (LOPRESSOR) 25 MG tablet, Take 1 tablet (25 mg total) by mouth 2 (two) times daily., Disp: 180 tablet, Rfl: 1   Multiple Vitamin (MULTIVITAMIN) tablet, Take 1 tablet by mouth daily., Disp: , Rfl:    triamcinolone cream (KENALOG) 0.1 %, Apply 1 Application topically 2 (two) times daily., Disp: 80 g, Rfl: 1   Vitamin D, Ergocalciferol, (DRISDOL) 1.25 MG (50000 UNIT) CAPS capsule, TAKE 1 CAPSULE BY MOUTH TWICE PER WEEK, Disp: 24 capsule, Rfl: 1    Physical Exam: There were no vitals taken  for this visit.    Affect appropriate Healthy:  appears stated age HEENT: normal Neck supple with no adenopathy JVP normal no bruits no thyromegaly Lungs clear with no wheezing and good diaphragmatic motion Heart:  S1/S2 SEM through AVR prior sternotomy  Abdomen: benighn, BS positve, no tenderness, no AAA no bruit.  No HSM or HJR ventral hernia repair and right hernia repair Distal pulses intact with no bruits No edema Neuro non-focal Skin warm and dry No muscular weakness   Labs:   Lab Results  Component Value Date   WBC 7.9 07/18/2023   HGB 15.0 07/18/2023   HCT 45.2 07/18/2023   MCV 96 07/18/2023   PLT 318 07/18/2023   No results for input(s): "NA", "K", "CL", "CO2", "BUN", "CREATININE", "CALCIUM", "PROT", "BILITOT", "ALKPHOS", "ALT", "AST", "GLUCOSE" in the last 168 hours.  Invalid input(s): "LABALBU" Lab Results  Component Value Date   TROPONINI 0.10 (HH) 07/21/2018    Lab Results  Component Value Date   CHOL 120 07/18/2023   CHOL 111 12/12/2022   CHOL 114 06/09/2022   Lab Results  Component Value Date   HDL 48 07/18/2023   HDL 51 12/12/2022   HDL 53 06/09/2022   Lab Results  Component Value Date   LDLCALC 37 07/18/2023   LDLCALC 40 12/12/2022   LDLCALC 38 06/09/2022   Lab Results  Component Value Date   TRIG 227 (H) 07/18/2023   TRIG 108 12/12/2022   TRIG 134 06/09/2022   Lab Results  Component Value Date   CHOLHDL 2.5  07/18/2023   CHOLHDL 2.2 12/12/2022   CHOLHDL 2.2 06/09/2022   No results found for: "LDLDIRECT"    Radiology: No results found.  EKG: 2022 SR rate 56 normal    ASSESSMENT AND PLAN:   AVR: see above update echo for gradients  CAD: prior CABG no angina continue ASA/beta blocker HLDL  on repatha LDL at goal 37 GERD: improved with ventral hernia repair on nexium  TTE  F/U in a year   Signed: Charlton Haws 10/23/2023, 5:23 PM

## 2023-11-01 ENCOUNTER — Ambulatory Visit: Payer: PPO | Admitting: Cardiovascular Disease

## 2023-12-15 ENCOUNTER — Ambulatory Visit: Payer: PPO | Admitting: Physician Assistant

## 2023-12-21 DIAGNOSIS — L570 Actinic keratosis: Secondary | ICD-10-CM | POA: Diagnosis not present

## 2024-02-01 ENCOUNTER — Other Ambulatory Visit: Payer: Self-pay | Admitting: Family Medicine

## 2024-02-01 DIAGNOSIS — E782 Mixed hyperlipidemia: Secondary | ICD-10-CM

## 2024-02-01 DIAGNOSIS — I2510 Atherosclerotic heart disease of native coronary artery without angina pectoris: Secondary | ICD-10-CM

## 2024-02-01 DIAGNOSIS — E66811 Obesity, class 1: Secondary | ICD-10-CM

## 2024-02-06 ENCOUNTER — Telehealth: Payer: Self-pay

## 2024-02-06 NOTE — Telephone Encounter (Signed)
 Patient has been approved for repatha  through 05.23.2026.

## 2024-03-10 ENCOUNTER — Other Ambulatory Visit: Payer: Self-pay | Admitting: Family Medicine

## 2024-03-10 DIAGNOSIS — I2510 Atherosclerotic heart disease of native coronary artery without angina pectoris: Secondary | ICD-10-CM

## 2024-03-10 DIAGNOSIS — E782 Mixed hyperlipidemia: Secondary | ICD-10-CM

## 2024-03-20 ENCOUNTER — Other Ambulatory Visit: Payer: Self-pay

## 2024-04-02 DIAGNOSIS — L578 Other skin changes due to chronic exposure to nonionizing radiation: Secondary | ICD-10-CM | POA: Diagnosis not present

## 2024-04-02 DIAGNOSIS — L821 Other seborrheic keratosis: Secondary | ICD-10-CM | POA: Diagnosis not present

## 2024-04-18 ENCOUNTER — Encounter: Payer: Self-pay | Admitting: Cardiology

## 2024-04-18 ENCOUNTER — Ambulatory Visit: Attending: Cardiology | Admitting: Cardiology

## 2024-04-18 VITALS — BP 146/74 | HR 61 | Ht 73.8 in | Wt 220.0 lb

## 2024-04-18 DIAGNOSIS — I480 Paroxysmal atrial fibrillation: Secondary | ICD-10-CM

## 2024-04-18 DIAGNOSIS — I2510 Atherosclerotic heart disease of native coronary artery without angina pectoris: Secondary | ICD-10-CM | POA: Diagnosis not present

## 2024-04-18 DIAGNOSIS — Z952 Presence of prosthetic heart valve: Secondary | ICD-10-CM | POA: Diagnosis not present

## 2024-04-18 NOTE — Progress Notes (Signed)
 Cardiology Office Note:  .   Date:  04/18/2024  ID:  Daniel Mann, DOB [birth year], MRN [id number] PCP: Sherre Clapper, MD  [facility] Providers Cardiologist:  Maude Emmer, MD     History of Present Illness: .   Daniel Mann is a 73 y.o. male Discussed the use of AI scribe software for clinical note transcription with the patient, who gave verbal consent to proceed.  History of Present Illness Daniel Mann is a 73 year old male with coronary artery disease and prior CABG who presents for follow-up.  He underwent coronary artery bypass grafting (CABG) on July 12, 2018, with LIMA to LAD, SVG to OM2, and SVG to RCA. Additionally, he had an aortic valve replacement with a 23 mm Edwards Inspiris Resilia pericardial valve. He has a history of a cryptogenic stroke in 2020, for which an implantable loop recorder was placed, with no atrial fibrillation detected.  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 He acknowledges the need to walk more outside of work, although he stays active by walking at the store. He has a family connection in the medical field, as his son works in the vascular system at [employer].  His current medications include aspirin  325 mg, Repatha  injections every two weeks for cholesterol management, lisinopril  20 mg daily, metoprolol  25 mg, and vitamin D2. He reports sometimes having a little bit of a stomach problem. His LDL is 37.  No chest pain, shortness of breath, fainting, or other unusual symptoms. 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   Studies Reviewed: SABRA   EKG Interpretation Date/Time:  Thursday April 18 2024 08:03:26 EDT Ventricular Rate:  61 PR Interval:    QRS Duration:  96 QT Interval:  370 QTC Calculation: 372 R Axis:   36  Text Interpretation: Sinus rhythm with occasional Premature ventricular complexes Inferior infarct , age  undetermined When compared with ECG of 20-Jul-2018 15:04, Premature ventricular complexes are new as well as inferior Q waves Confirmed by Jeffrie Anes ([id number]) on 04/18/2024 8:27:02 AM    Results LABS LDL: 37 mg/dL  DIAGNOSTIC Echocardiogram: EF 70%, aortic valve mean gradient 14 mmHg (2021) Risk Assessment/Calculations:           Physical Exam:   VS:  BP (!) 146/74   Pulse 61   Ht 6' 1.8 (1.875 m)   Wt 220 lb (99.8 kg)   SpO2 95%   BMI 28.40 kg/m    Wt Readings from Last 3 Encounters:  04/18/24 220 lb (99.8 kg)  07/18/23 252 lb (114.3 kg)  12/12/22 249 lb (112.9 kg)    GEN: Well nourished, well developed in no acute distress NECK: No JVD; No carotid bruits CARDIAC: RRR, 2/6 SM, no rubs, no gallops RESPIRATORY:  Clear to auscultation without rales, wheezing or rhonchi  ABDOMEN: Soft, non-tender, non-distended EXTREMITIES:  No edema; No deformity   ASSESSMENT AND PLAN: .    Assessment and Plan Assessment & Plan Coronary artery disease status post coronary artery bypass grafting Status post triple coronary artery bypass grafting in 2019 with LIMA to LAD, SVG to OM2, and SVG to RCA. Asymptomatic with no chest pain, shortness of breath, or syncope. Maintains a high level of physical activity. 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 Presenting with a 2/6 systolic murmur, likely related to the prosthetic valve. No symptoms such as chest pain, shortness of breath, or syncope. Valve expected to last 10-15 years. - Order echocardiogram to evaluate the function of the bioprosthetic aortic valve  Hypertension Blood pressure recorded at 146/74  mmHg, possibly elevated due to white coat effect. Currently on lisinopril  20 mg daily and metoprolol  25 mg daily. - Recheck blood pressure after relaxation - Continue current antihypertensive regimen  Hyperlipidemia on PCSK9 inhibitor therapy LDL cholesterol well-controlled at 37 mg/dL with Repatha  (PCSK9 inhibitor) injections every two weeks. No adverse effects reported. - Continue Repatha  injections every two weeks         Dispo: 1 yr APP  Signed, Oneil Parchment, MD

## 2024-04-18 NOTE — Patient Instructions (Signed)
 Medication Instructions:  The current medical regimen is effective;  continue present plan and medications.  *If you need a refill on your cardiac medications before your next appointment, please call your pharmacy*  Testing/Procedures: Your physician has requested that you have an echocardiogram. Echocardiography is a painless test that uses sound waves to create images of your heart. It provides your doctor with information about the size and shape of your heart and how well your heart's chambers and valves are working. This procedure takes approximately one hour. There are no restrictions for this procedure. Please do NOT wear cologne, perfume, aftershave, or lotions (deodorant is allowed). Please arrive 15 minutes prior to your appointment time.  Please note: We ask at that you not bring children with you during ultrasound (echo/ vascular) testing. Due to room size and safety concerns, children are not allowed in the ultrasound rooms during exams. Our front office staff cannot provide observation of children in our lobby area while testing is being conducted. An adult accompanying a patient to their appointment will only be allowed in the ultrasound room at the discretion of the ultrasound technician under special circumstances. We apologize for any inconvenience.   Follow-Up: At [facility], you and your health needs are our priority.  As part of our continuing mission to provide you with exceptional heart care, our providers are all part of one team.  This team includes your primary Cardiologist (physician) and Advanced Practice Providers or APPs (Physician Assistants and Nurse Practitioners) who all work together to provide you with the care you need, when you need it.  Your next appointment:   1 year(s)  Provider:   One of our Advanced Practice Providers (APPs): Morse Clause, PA-C  Lamarr Satterfield, NP Miriam Shams, NP  Olivia Pavy, PA-C Josefa Beauvais, NP  Leontine Salen,  PA-C Orren Fabry, PA-C  [place], PA-C Ernest Dick, NP  Damien Braver, NP Jon Hails, PA-C  Waddell Donath, PA-C    Dayna Dunn, PA-C  Scott Weaver, PA-C Lum Louis, NP Katlyn West, NP Callie Goodrich, PA-C  Evan Williams, PA-C Sheng Haley, PA-C  Xika Zhao, NP Kathleen Johnson, PA-C       We recommend signing up for the patient portal called MyChart.  Sign up information is provided on this After Visit Summary.  MyChart is used to connect with patients for Virtual Visits (Telemedicine).  Patients are able to view lab/test results, encounter notes, upcoming appointments, etc.  Non-urgent messages can be sent to your provider as well.   To learn more about what you can do with MyChart, go to [email or web address].

## 2024-04-21 ENCOUNTER — Other Ambulatory Visit: Payer: Self-pay | Admitting: Family Medicine

## 2024-04-21 DIAGNOSIS — K219 Gastro-esophageal reflux disease without esophagitis: Secondary | ICD-10-CM

## 2024-05-15 ENCOUNTER — Other Ambulatory Visit: Payer: Self-pay | Admitting: Family Medicine

## 2024-05-15 DIAGNOSIS — I119 Hypertensive heart disease without heart failure: Secondary | ICD-10-CM

## 2024-05-15 DIAGNOSIS — E66811 Obesity, class 1: Secondary | ICD-10-CM

## 2024-05-15 MED ORDER — VITAMIN D (ERGOCALCIFEROL) 1.25 MG (50000 UNIT) PO CAPS: 50000.0000 [IU] | ORAL_CAPSULE | ORAL | 1 refills | Status: AC

## 2024-05-15 MED ORDER — LISINOPRIL 20 MG PO TABS: 20.0000 mg | ORAL_TABLET | Freq: Every day | ORAL | 1 refills | Status: DC

## 2024-05-15 MED ORDER — METOPROLOL TARTRATE 25 MG PO TABS
25.0000 mg | ORAL_TABLET | Freq: Two times a day (BID) | ORAL | 1 refills | Status: AC
Start: 2024-05-15 — End: ?

## 2024-05-15 NOTE — Telephone Encounter (Signed)
 Copied from CRM [id number]. Topic: Clinical - Medication Refill >> May 15, 2024 12:25 PM Delon T wrote: Medication: Vitamin D , Ergocalciferol , (DRISDOL ) 1.25 MG (50000 UNIT) CAPS capsule lisinopril  (ZESTRIL ) 20 MG tablet metoprolol  tartrate (LOPRESSOR ) 25 MG tablet  Has the patient contacted their pharmacy? Yes (Agent: If no, request that the patient contact the pharmacy for the refill. If patient does not wish to contact the pharmacy document the reason why and proceed with request.) (Agent: If yes, when and what did the pharmacy advise?)  This is the patient's preferred pharmacy:  [id number] - [place], [place] - [address] [facility] [address] Phone: [phone] Fax: [phone]  Is this the correct pharmacy for this prescription? Yes If no, delete pharmacy and type the correct one.   Has the prescription been filled recently? Yes  Is the patient out of the medication? Yes  Has the patient been seen for an appointment in the last year OR does the patient have an upcoming appointment? Yes  Can we respond through MyChart? Yes  Agent: Please be advised that Rx refills may take up to 3 business days. We ask that you follow-up with your pharmacy.

## 2024-05-15 NOTE — Telephone Encounter (Signed)
 FYI Only or Action Required?: Action required by provider: medication refill request.  Patient was last seen in primary care on 07/18/2023 by Sherre Clapper, MD.  Called Nurse Triage reporting No chief complaint on file..  Symptoms began today.  Interventions attempted: Nothing.  Symptoms are: stable.  Triage Disposition: No disposition on file.  Patient/caregiver understands and will follow disposition?:

## 2024-05-28 ENCOUNTER — Ambulatory Visit (HOSPITAL_COMMUNITY)
Admission: RE | Admit: 2024-05-28 | Discharge: 2024-05-28 | Disposition: A | Discharge status: Home / Self Care | Source: Ambulatory Visit | Attending: Cardiovascular Disease | Admitting: Cardiovascular Disease

## 2024-05-28 DIAGNOSIS — Z952 Presence of prosthetic heart valve: Secondary | ICD-10-CM | POA: Insufficient documentation

## 2024-05-28 DIAGNOSIS — I2510 Atherosclerotic heart disease of native coronary artery without angina pectoris: Secondary | ICD-10-CM | POA: Insufficient documentation

## 2024-05-28 LAB — ECHOCARDIOGRAM COMPLETE
AR max vel: 1.7 cm2
AV Area VTI: 1.66 cm2
AV Area mean vel: 1.47 cm2
AV Mean grad: 20 mmHg
AV Peak grad: 34.3 mmHg
Ao pk vel: 2.93 m/s
Area-P 1/2: 4.06 cm2
S' Lateral: 2.4 cm

## 2024-05-28 MED ORDER — PERFLUTREN LIPID MICROSPHERE
3.0000 mL | INTRAVENOUS | Status: AC | PRN
  Administered 2024-05-28: 3 mL via INTRAVENOUS

## 2024-05-29 ENCOUNTER — Ambulatory Visit: Payer: Self-pay | Admitting: Cardiology

## 2024-05-29 DIAGNOSIS — Z952 Presence of prosthetic heart valve: Secondary | ICD-10-CM

## 2024-05-29 DIAGNOSIS — I2510 Atherosclerotic heart disease of native coronary artery without angina pectoris: Secondary | ICD-10-CM

## 2024-07-21 ENCOUNTER — Other Ambulatory Visit: Payer: Self-pay | Admitting: Family Medicine

## 2024-07-21 DIAGNOSIS — K219 Gastro-esophageal reflux disease without esophagitis: Secondary | ICD-10-CM

## 2024-07-23 ENCOUNTER — Encounter: Payer: Self-pay | Admitting: Family Medicine

## 2024-07-23 ENCOUNTER — Ambulatory Visit (INDEPENDENT_AMBULATORY_CARE_PROVIDER_SITE_OTHER): Payer: PPO | Admitting: Family Medicine

## 2024-07-23 VITALS — BP 122/78 | HR 58 | Temp 98.2°F | Ht 73.0 in | Wt 227.0 lb

## 2024-07-23 DIAGNOSIS — Z0000 Encounter for general adult medical examination without abnormal findings: Secondary | ICD-10-CM

## 2024-07-23 DIAGNOSIS — I119 Hypertensive heart disease without heart failure: Secondary | ICD-10-CM

## 2024-07-23 DIAGNOSIS — K219 Gastro-esophageal reflux disease without esophagitis: Secondary | ICD-10-CM | POA: Diagnosis not present

## 2024-07-23 DIAGNOSIS — R7303 Prediabetes: Secondary | ICD-10-CM

## 2024-07-23 DIAGNOSIS — E782 Mixed hyperlipidemia: Secondary | ICD-10-CM

## 2024-07-23 DIAGNOSIS — Z1159 Encounter for screening for other viral diseases: Secondary | ICD-10-CM

## 2024-07-23 DIAGNOSIS — E559 Vitamin D deficiency, unspecified: Secondary | ICD-10-CM | POA: Diagnosis not present

## 2024-07-23 DIAGNOSIS — Z953 Presence of xenogenic heart valve: Secondary | ICD-10-CM

## 2024-07-23 DIAGNOSIS — I2510 Atherosclerotic heart disease of native coronary artery without angina pectoris: Secondary | ICD-10-CM

## 2024-07-23 NOTE — Progress Notes (Signed)
 Subjective:   Daniel Mann is a 73 y.o. male who presents for a Medicare Annual Wellness Visit.  Discussed the use of AI scribe software for clinical note transcription with the patient, who gave verbal consent to proceed.  History of Present Illness Daniel Mann is a 73 year old male with coronary artery disease and aortic valve replacement who presents for a Medicare wellness visit.  Cardiovascular health - Coronary artery disease managed with annual cardiology follow-up - History of aortic valve replacement and coronary artery bypass surgery - No history of atrial fibrillation, despite previous documentation - Takes lisinopril  20 mg daily, metoprolol  25 mg daily, and aspirin  81 mg daily for blood pressure and cardiovascular protection - Aspirin  dose reduced from higher dose to 81 mg daily to avoid gastrointestinal side effects - Receives Repatha  for hyperlipidemia  Glycemic control - Prediabetes with last hemoglobin A1c of 6.1 one year ago - Adheres to a healthy diet including grilled fish or chicken, Caesar salads, and occasional whole wheat bread - Avoids fried foods and sweets  Gastrointestinal symptoms - Takes Nexium  for acid reflux - No abdominal pain or significant swelling - No fevers, chills, sweats  General function and activity - Remains physically active, 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 - No history of falls - Feels chilly in the morning, attributed to weather  Neurological symptoms - No headaches, dizziness, numbness, or tingling  Otolaryngologic symptoms - No earaches, sore throat, or stuffy nose  Supplement use - Takes vitamin D  twice a week  Substance use - Quit smoking 30 years ago - Drinks wine occasionally    Allergies (verified) Statins   History: Past Medical History:  Diagnosis Date   Acute encephalopathy 07/20/2018   Aortic stenosis    CAD (coronary artery disease)    CHF (congestive heart failure) (HCC)    GERD  (gastroesophageal reflux disease)    Heart murmur    Hernia, inguinal, right    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/2020   Hyperlipidemia    Hypertension    Impaired fasting glucose    Intraperitoneal abscess (HCC) 04/30/2020   Mixed hyperlipidemia    S/P CABG x 3 07/12/2018   LIMA to LAD SVG to CIRCUMFLEX SVG to RCA    Severe aortic stenosis 07/01/2018   Umbilical hernia    Vitamin D  deficiency    Past Surgical History:  Procedure Laterality Date   AORTIC VALVE REPLACEMENT N/A 07/12/2018   Procedure: AORTIC VALVE REPLACEMENT (AVR) using an Inspiris Valve size  23;  Surgeon: Lucas Dorise POUR, MD;  Location: [facility] OR;  Service: Open Heart Surgery;  Laterality: N/A;   CATARACT EXTRACTION, BILATERAL     COLON SURGERY  04/2020   Rt hemicolectomy with reanastomosis. ischemic bowel.   COLONOSCOPY     CORONARY ARTERY BYPASS GRAFT N/A 07/12/2018   Procedure: CORONARY ARTERY BYPASS GRAFTING (CABG) times three using left internal mammary artery and right greater saphenous vein harvested endoscopically.;  Surgeon: Lucas Dorise POUR, MD;  Location: [facility] OR;  Service: Open Heart Surgery;  Laterality: N/A;   INSERTION OF MESH  10/02/2019   Procedure: INSERTION OF MESH;  Surgeon: Belinda Cough, MD;  Location: [facility];  Service: General;;   LOOP RECORDER INSERTION N/A 10/09/2018   Procedure: LOOP RECORDER INSERTION;  Surgeon: Kelsie Agent, MD;  Location: [facility] INVASIVE CV LAB;  Service: Cardiovascular;  Laterality: N/A;   RIGHT/LEFT HEART CATH AND CORONARY ANGIOGRAPHY N/A 07/02/2018   Procedure: RIGHT/LEFT HEART CATH AND  CORONARY ANGIOGRAPHY;  Surgeon: Claudene Victory ORN, MD;  [facility]: [facility] INVASIVE CV LAB;  Service: Cardiovascular;  Laterality: N/A;   TEE WITHOUT CARDIOVERSION N/A 07/12/2018   Procedure: TRANSESOPHAGEAL ECHOCARDIOGRAM (TEE);  Surgeon: Lucas Dorise POUR, MD;  Location[facility];  Service: Open Heart Surgery;  Laterality: N/A;   UMBILICAL HERNIA REPAIR N/A 10/02/2019   Procedure: UMBILICAL  HERNIA REPAIR WITH MESH;  Surgeon: Belinda Cough, MD;  Location: [facility];  Service: General;  Laterality: N/A;   Family History  Problem Relation Age of Onset   Alzheimer's disease Mother    Renal Disease Father    Cancer - Prostate Father    Social History   Occupational History    Comment: [employer] in [place]  Tobacco Use   Smoking status: Former   Smokeless tobacco: Never  Vaping Use   Vaping status: Never Used  Substance and Sexual Activity   Alcohol use: Yes    Comment: WINE occais   Drug use: No   Sexual activity: Not on file   Tobacco Counseling Counseling given: Not Answered  SDOH Screenings   Food Insecurity: No Food Insecurity (07/23/2024)  Housing: Low Risk  (07/23/2024)  Transportation Needs: No Transportation Needs (07/23/2024)  Utilities: Not At Risk (07/23/2024)  Alcohol Screen: Low Risk  (07/23/2024)  Depression (PHQ2-9): Low Risk  (07/23/2024)  Financial Resource Strain: Low Risk  (07/23/2024)  Physical Activity: Sufficiently Active (07/23/2024)  Social Connections: Socially Isolated (07/18/2023)  Stress: No Stress Concern Present (07/23/2024)  Tobacco Use: Medium Risk (07/28/2024)  Health Literacy: Adequate Health Literacy (07/23/2024)   Depression Screen    07/23/2024    8:28 AM 07/18/2023    2:03 PM 11/30/2021    7:34 AM 04/22/2021    9:21 AM 12/10/2020    5:53 PM 02/27/2020    8:32 AM  PHQ 2/9 Scores  PHQ - 2 Score 0 0 0 0 2 1  PHQ- 9 Score  0    4       Data saved with a previous flowsheet row definition     Visit info / Clinical Intake: Medicare Wellness Visit Type:: Subsequent Annual Wellness Visit Persons participating in visit:: patient Medicare Wellness Visit Mode:: In-person (required for WTM) Information given by:: patient Interpreter Needed?: No Pre-visit prep was completed: no AWV questionnaire completed by patient prior to visit?: no Living arrangements:: (!) lives alone Patient's Overall Health Status  Rating: very good Typical amount of pain: none Does pain affect daily life?: no Are you currently prescribed opioids?: no  Dietary Habits and Nutritional Risks How many meals a day?: 3 Eats fruit and vegetables daily?: yes Most meals are obtained by: eating out Diabetic:: no  Functional Status Activities of Daily Living (to include ambulation/medication): Independent Ambulation: Independent Medication Administration: Independent Home Management: Independent Manage your own finances?: yes Primary transportation is: driving Concerns about vision?: no *vision screening is required for WTM* Concerns about hearing?: no  Fall Screening Falls in the past year?: 0 Number of falls in past year: 0 Was there an injury with Fall?: 0 Fall Risk Category Calculator: 0 Patient Fall Risk Level: Low Fall Risk  Fall Risk Patient at Risk for Falls Due to: No Fall Risks Fall risk Follow up: Falls evaluation completed  Home and Transportation Safety: All rugs have non-skid backing?: N/A, no rugs All stairs or steps have railings?: N/A, no stairs Grab bars in the bathtub or shower?: yes Have non-skid surface in bathtub or shower?: yes Good home lighting?: yes  Regular seat belt use?: yes Hospital stays in the last year:: no  Cognitive Assessment Difficulty concentrating, remembering, or making decisions? : no Will 6CIT or Mini Cog be Completed: yes What year is it?: 0 points What month is it?: 0 points Give patient an address phrase to remember (5 components): The dog ran across the road. About what time is it?: 0 points Count backwards from 20 to 1: 0 points Say the months of the year in reverse: 0 points Repeat the address phrase from earlier: 0 points 6 CIT Score: 0 points  Advance Directives (For Healthcare) Does Patient Have a Medical Advance Directive?: Yes Does patient want to make changes to medical advance directive?: No - Patient declined Type of Advance Directive:  Healthcare Power of [facility]; Living will Copy of Healthcare Power of Attorney in Chart?: No - copy requested Copy of Living Will in Chart?: No - copy requested  Reviewed/Updated  Reviewed/Updated: Reviewed All (Medical, Surgical, Family, Medications, Allergies, Care Teams, Patient Goals)    Review of Systems  Constitutional:  Negative for chills, diaphoresis, fever and malaise/fatigue.  HENT:  Negative for congestion, ear pain and sore throat.   Respiratory:  Negative for cough and sputum production.   Cardiovascular:  Negative for chest pain and palpitations.  Gastrointestinal:  Negative for abdominal pain, constipation, diarrhea, nausea and vomiting.  Genitourinary:  Negative for dysuria and urgency.  Musculoskeletal:  Negative for myalgias.  Neurological:  Negative for dizziness and headaches.  Psychiatric/Behavioral:  Negative for depression. The patient is not nervous/anxious.        Objective:    Today's Vitals   07/23/24 0826  BP: 122/78  Pulse: (!) 58  Temp: 98.2 F (36.8 C)  SpO2: 95%  Weight: 227 lb (103 kg)  Height: 6' 1 (1.854 m)  PainSc: 0-No pain   Body mass index is 29.95 kg/m.  Physical Exam Vitals reviewed.  Constitutional:      Appearance: Normal appearance.  Neck:     Vascular: No carotid bruit.  Cardiovascular:     Rate and Rhythm: Normal rate and regular rhythm.     Heart sounds: Murmur heard.     Comments: CLICK Pulmonary:     Effort: Pulmonary effort is normal.     Breath sounds: Normal breath sounds. No wheezing, rhonchi or rales.  Abdominal:     General: Bowel sounds are normal.     Palpations: Abdomen is soft.     Tenderness: There is no abdominal tenderness.  Musculoskeletal:     Right lower leg: No edema.     Left lower leg: No edema.  Neurological:     Mental Status: He is alert and oriented to person, place, and time.  Psychiatric:        Mood and Affect: Mood normal.        Behavior: Behavior normal.     Current  Medications (verified) Outpatient Encounter Medications as of 07/23/2024  Medication Sig   acetaminophen  (TYLENOL ) 325 MG tablet Take 2 tablets (650 mg total) by mouth every 6 (six) hours as needed for mild pain (pain score 1-3).   aspirin  325 MG tablet Take 325 mg by mouth daily.   esomeprazole  (NEXIUM ) 40 MG capsule TAKE 1 CAPSULE (40 MG TOTAL) BY MOUTH DAILY.   Evolocumab  (REPATHA  SURECLICK) 140 MG/ML SOAJ INJECT 1 EACH INTO THE SKIN EVERY 14 (FOURTEEN) DAYS.   metoprolol  tartrate (LOPRESSOR ) 25 MG tablet Take 1 tablet (25 mg total) by mouth 2 (two) times daily.  Multiple Vitamin (MULTIVITAMIN) tablet Take 1 tablet by mouth daily.   triamcinolone  cream (KENALOG ) 0.1 % Apply 1 Application topically 2 (two) times daily.   Vitamin D , Ergocalciferol , (DRISDOL ) 1.25 MG (50000 UNIT) CAPS capsule Take 1 capsule (50,000 Units total) by mouth 2 (two) times a week.   [DISCONTINUED] lisinopril  (ZESTRIL ) 20 MG tablet Take 1 tablet (20 mg total) by mouth daily.   No facility-administered encounter medications on file as of 07/23/2024.   Hearing/Vision screen No results found. Immunizations and Health Maintenance Health Maintenance  Topic Date Due   COVID-19 Vaccine (10 - 2025-26 season) 01/16/2025   DTaP/Tdap/Td (2 - Td or Tdap) 07/13/2025   Medicare Annual Wellness (AWV)  07/23/2025   Colonoscopy  03/25/2030   Pneumococcal Vaccine: 50+ Years  Completed   Influenza Vaccine  Completed   Hepatitis C Screening  Completed   Zoster Vaccines- Shingrix  Completed   Meningococcal B Vaccine  Aged Out        Assessment/Plan:  This is a routine wellness examination for [facility].  Assessment & Plan Encounter for Medicare annual wellness exam Routine wellness visit with no significant changes. Engages in regular physical activity and maintains a healthy diet. - Continue current lifestyle and exercise regimen. - Ensure safety measures at home, including seatbelt use and smoke detectors.    Coronary  artery disease involving native coronary artery of native heart without angina pectoris Coronary artery disease with previous interventions. Regular cardiology follow-up. Recent echocardiogram satisfactory. No atrial fibrillation. - Continue annual follow-up with cardiologist. - Continue current medications: lisinopril , metoprolol , aspirin .    Hypertensive heart disease without heart failure Blood pressure 122/72 mmHg. - Continue current antihypertensive medications: lisinopril  and metoprolol .    Prediabetes Last hemoglobin A1c 6.1% a year ago. Discussed dietary modifications. - Ordered hemoglobin A1c test. - Advised dietary modifications to limit sugar and carbohydrates. Orders:   Hemoglobin A1c  Vitamin D  deficiency Vitamin D  levels previously adequate. Current supplementation may be excessive. - Consider reducing vitamin D  supplementation to once a week. Orders:   VITAMIN D  25 Hydroxy (Vit-D Deficiency, Fractures)  Gastroesophageal reflux disease without esophagitis GERD managed with Nexium . No new symptoms. - Continue Nexium  for GERD management. Orders:   Interpretation:  Presence of xenogenic heart valve Management per specialist.    Mixed hyperlipidemia Managed with Repatha . No recent lipid panel results discussed. - Continue Repatha  for hyperlipidemia management. Orders:   CBC with Differential/Platelet   Comprehensive metabolic panel with GFR   Lipid panel   TSH  Need for hepatitis C screening test Recommended screening for hepatitis C as part of routine health maintenance. - Ordered hepatitis C screening test. Orders:   HCV Ab w Reflex to Quant PCR    Patient Care Team: Sherre Clapper, MD as PCP - General (Family Medicine) Jeffrie Oneil BROCKS, MD as PCP - Cardiology (Cardiology) Lucas Dorise POUR, MD as Consulting Physician (Cardiothoracic Surgery)  I have personally reviewed and noted the following in the patient's chart:   Medical and social history Use of  alcohol, tobacco or illicit drugs  Current medications and supplements including opioid prescriptions. Functional ability and status Nutritional status Physical activity Advanced directives List of other physicians Hospitalizations, surgeries, and ER visits in previous 12 months Vitals Screenings to include cognitive, depression, and falls Referrals and appointments  Orders Placed This Encounter  Procedures   CBC with Differential/Platelet   Comprehensive metabolic panel with GFR   Lipid panel   Hemoglobin A1c   VITAMIN D  25 Hydroxy (Vit-D  Deficiency, Fractures)   TSH   HCV Ab w Reflex to Quant PCR   Interpretation:   In addition, I have reviewed and discussed with patient certain preventive protocols, quality metrics, and best practice recommendations. A written personalized care plan for preventive services as well as general preventive health recommendations were provided to patient.   Abigail Free, MD   07/28/2024   Return in 1 year (on 07/23/2025) for chronic follow up, awv.   I,Marla I Leal-Borjas,acting as a scribe for Abigail Free, MD.,have documented all relevant documentation on the behalf of Abigail Free, MD,as directed by  Abigail Free, MD while in the presence of Abigail Free, MD.   I attest that I have reviewed this visit and agree with the plan scribed by my staff.   Abigail Free, MD [facility] [phone]

## 2024-07-23 NOTE — Patient Instructions (Signed)
 Things to do to keep yourself healthy  - Exercise at least 30-45 minutes a day, 3-4 days a week.  - Eat a low-fat diet with lots of fruits and vegetables, up to 7-9 servings per day.  - Seatbelts can save your life. Wear them always.  - Smoke detectors on every level of your home, check batteries every year.  - Eye Doctor - have an eye exam every 1-2 years   - Alcohol -  If you drink, do it moderately, less than 2 drinks per day.  - Health Care Power of Attorney. Choose someone to speak for you if you are not able.  - Depression is common in our stressful world.If you're feeling down or losing interest in things you normally enjoy, please come in for a visit.  - Violence - If anyone is threatening or hurting you, please call immediately.

## 2024-07-24 ENCOUNTER — Ambulatory Visit: Payer: Self-pay | Admitting: Family Medicine

## 2024-07-24 DIAGNOSIS — I119 Hypertensive heart disease without heart failure: Secondary | ICD-10-CM

## 2024-07-24 LAB — COMPREHENSIVE METABOLIC PANEL WITH GFR
ALT: 36 IU/L (ref 0–44)
AST: 36 IU/L (ref 0–40)
Albumin: 4.3 g/dL (ref 3.8–4.8)
Alkaline Phosphatase: 86 IU/L (ref 47–123)
BUN/Creatinine Ratio: 13 (ref 10–24)
BUN: 12 mg/dL (ref 8–27)
Bilirubin Total: 0.3 mg/dL (ref 0.0–1.2)
CO2: 26 mmol/L (ref 20–29)
Calcium: 10.5 mg/dL — ABNORMAL HIGH (ref 8.6–10.2)
Chloride: 103 mmol/L (ref 96–106)
Creatinine, Ser: 0.9 mg/dL (ref 0.76–1.27)
Globulin, Total: 2 g/dL (ref 1.5–4.5)
Glucose: 102 mg/dL — ABNORMAL HIGH (ref 70–99)
Potassium: 5.6 mmol/L — ABNORMAL HIGH (ref 3.5–5.2)
Sodium: 139 mmol/L (ref 134–144)
Total Protein: 6.3 g/dL (ref 6.0–8.5)
eGFR: 90 mL/min/1.73 (ref >59)

## 2024-07-24 LAB — CBC WITH DIFFERENTIAL/PLATELET
Basophils Absolute: 0.1 x10E3/uL (ref 0.0–0.2)
Basos: 1 %
EOS (ABSOLUTE): 0.3 x10E3/uL (ref 0.0–0.4)
Eos: 4 %
Hematocrit: 47.2 % (ref 37.5–51.0)
Hemoglobin: 15.9 g/dL (ref 13.0–17.7)
Immature Grans (Abs): 0 x10E3/uL (ref 0.0–0.1)
Immature Granulocytes: 0 %
Lymphocytes Absolute: 1.6 x10E3/uL (ref 0.7–3.1)
Lymphs: 26 %
MCH: 32.6 pg (ref 26.6–33.0)
MCHC: 33.7 g/dL (ref 31.5–35.7)
MCV: 97 fL (ref 79–97)
Monocytes Absolute: 0.9 x10E3/uL (ref 0.1–0.9)
Monocytes: 14 %
Neutrophils Absolute: 3.4 x10E3/uL (ref 1.4–7.0)
Neutrophils: 54 %
Platelets: 274 x10E3/uL (ref 150–450)
RBC: 4.88 x10E6/uL (ref 4.14–5.80)
RDW: 12.3 % (ref 11.6–15.4)
WBC: 6.3 x10E3/uL (ref 3.4–10.8)

## 2024-07-24 LAB — LIPID PANEL
Chol/HDL Ratio: 2.1 ratio (ref 0.0–5.0)
Cholesterol, Total: 121 mg/dL (ref 100–199)
HDL: 57 mg/dL (ref >39)
LDL Chol Calc (NIH): 44 mg/dL (ref 0–99)
Triglycerides: 111 mg/dL (ref 0–149)
VLDL Cholesterol Cal: 20 mg/dL (ref 5–40)

## 2024-07-24 LAB — HCV AB W REFLEX TO QUANT PCR: HCV Ab: NONREACTIVE

## 2024-07-24 LAB — TSH: TSH: 2.26 u[IU]/mL (ref 0.450–4.500)

## 2024-07-24 LAB — HCV INTERPRETATION

## 2024-07-24 LAB — HEMOGLOBIN A1C
Est. average glucose Bld gHb Est-mCnc: 128 mg/dL
Hgb A1c MFr Bld: 6.1 % — ABNORMAL HIGH (ref 4.8–5.6)

## 2024-07-24 LAB — VITAMIN D 25 HYDROXY (VIT D DEFICIENCY, FRACTURES): Vit D, 25-Hydroxy: 91.8 ng/mL (ref 30.0–100.0)

## 2024-07-24 MED ORDER — LOSARTAN POTASSIUM 50 MG PO TABS: 50.0000 mg | ORAL_TABLET | Freq: Every day | ORAL | 0 refills | Status: DC

## 2024-07-26 NOTE — Assessment & Plan Note (Addendum)
 Blood pressure 122/72 mmHg. - Continue current antihypertensive medications: lisinopril  and metoprolol .

## 2024-07-26 NOTE — Assessment & Plan Note (Addendum)
 Management per specialist.

## 2024-07-26 NOTE — Assessment & Plan Note (Addendum)
 Routine wellness visit with no significant changes. Engages in regular physical activity and maintains a healthy diet. - Continue current lifestyle and exercise regimen. - Ensure safety measures at home, including seatbelt use and smoke detectors.

## 2024-07-26 NOTE — Assessment & Plan Note (Addendum)
 Managed with Repatha . No recent lipid panel results discussed. - Continue Repatha  for hyperlipidemia management. Orders:   CBC with Differential/Platelet   Comprehensive metabolic panel with GFR   Lipid panel   TSH

## 2024-07-26 NOTE — Assessment & Plan Note (Addendum)
 Vitamin D  levels previously adequate. Current supplementation may be excessive. - Consider reducing vitamin D  supplementation to once a week. Orders:   VITAMIN D  25 Hydroxy (Vit-D Deficiency, Fractures)

## 2024-07-26 NOTE — Assessment & Plan Note (Addendum)
 GERD managed with Nexium . No new symptoms. - Continue Nexium  for GERD management. Orders:   Interpretation:

## 2024-07-26 NOTE — Assessment & Plan Note (Addendum)
 Coronary artery disease with previous interventions. Regular cardiology follow-up. Recent echocardiogram satisfactory. No atrial fibrillation. - Continue annual follow-up with cardiologist. - Continue current medications: lisinopril , metoprolol , aspirin .

## 2024-07-26 NOTE — Assessment & Plan Note (Addendum)
 Last hemoglobin A1c 6.1% a year ago. Discussed dietary modifications. - Ordered hemoglobin A1c test. - Advised dietary modifications to limit sugar and carbohydrates. Orders:   Hemoglobin A1c

## 2024-07-28 ENCOUNTER — Encounter: Payer: Self-pay | Admitting: Family Medicine

## 2024-08-15 ENCOUNTER — Other Ambulatory Visit

## 2024-08-15 ENCOUNTER — Other Ambulatory Visit: Payer: Self-pay | Admitting: Family Medicine

## 2024-08-15 DIAGNOSIS — E875 Hyperkalemia: Secondary | ICD-10-CM

## 2024-08-16 LAB — COMPREHENSIVE METABOLIC PANEL WITH GFR
ALT: 35 IU/L (ref 0–44)
AST: 29 IU/L (ref 0–40)
Albumin: 4.3 g/dL (ref 3.8–4.8)
Alkaline Phosphatase: 95 IU/L (ref 47–123)
BUN/Creatinine Ratio: 16 (ref 10–24)
BUN: 15 mg/dL (ref 8–27)
Bilirubin Total: 0.2 mg/dL (ref 0.0–1.2)
CO2: 23 mmol/L (ref 20–29)
Calcium: 11 mg/dL — ABNORMAL HIGH (ref 8.6–10.2)
Chloride: 102 mmol/L (ref 96–106)
Creatinine, Ser: 0.91 mg/dL (ref 0.76–1.27)
Globulin, Total: 2.2 g/dL (ref 1.5–4.5)
Glucose: 145 mg/dL — ABNORMAL HIGH (ref 70–99)
Potassium: 4.3 mmol/L (ref 3.5–5.2)
Sodium: 142 mmol/L (ref 134–144)
Total Protein: 6.5 g/dL (ref 6.0–8.5)
eGFR: 88 mL/min/1.73 (ref >59)

## 2024-08-20 ENCOUNTER — Ambulatory Visit: Payer: Self-pay | Admitting: Family Medicine

## 2024-08-26 ENCOUNTER — Other Ambulatory Visit

## 2024-08-28 ENCOUNTER — Other Ambulatory Visit: Payer: Self-pay | Admitting: Family Medicine

## 2024-08-28 DIAGNOSIS — E782 Mixed hyperlipidemia: Secondary | ICD-10-CM

## 2024-08-28 DIAGNOSIS — I2510 Atherosclerotic heart disease of native coronary artery without angina pectoris: Secondary | ICD-10-CM

## 2024-08-28 LAB — PROTEIN ELECTROPHORESIS, SERUM
A/G Ratio: 1.2 (ref 0.7–1.7)
Albumin ELP: 3.5 g/dL (ref 2.9–4.4)
Alpha 1: 0.2 g/dL (ref 0.0–0.4)
Alpha 2: 0.8 g/dL (ref 0.4–1.0)
Beta: 1.1 g/dL (ref 0.7–1.3)
Gamma Globulin: 0.9 g/dL (ref 0.4–1.8)
Globulin, Total: 2.9 g/dL (ref 2.2–3.9)
Total Protein: 6.4 g/dL (ref 6.0–8.5)

## 2024-08-28 LAB — CA+CREAT+P+PTH INTACT
Calcium: 11 mg/dL — ABNORMAL HIGH (ref 8.6–10.2)
Creatinine, Ser: 0.85 mg/dL (ref 0.76–1.27)
PTH: 51 pg/mL (ref 15–65)
Phosphorus: 3.4 mg/dL (ref 2.8–4.1)
eGFR: 91 mL/min/1.73 (ref >59)

## 2024-09-02 ENCOUNTER — Ambulatory Visit: Payer: Self-pay | Admitting: Family Medicine

## 2024-09-09 ENCOUNTER — Other Ambulatory Visit: Payer: Self-pay | Admitting: Family Medicine

## 2024-09-09 NOTE — Progress Notes (Signed)
 After more researching, I would like to order  an ULTRASOUND OF PARATHYROID/ SESTAMIBI SCAN of the parathyroid to rule out overactive gland.  24 hour urine calcium An  ACE level. Blood test.  I would recommend a referral to Endocrinology after these tests.   Orders placed. Dr. Sherre

## 2024-09-11 ENCOUNTER — Other Ambulatory Visit

## 2024-09-11 ENCOUNTER — Other Ambulatory Visit: Payer: Self-pay | Admitting: Family Medicine

## 2024-09-12 LAB — ANGIOTENSIN CONVERTING ENZYME: Angio Convert Enzyme: 74 U/L (ref 14–82)

## 2024-09-13 LAB — PSA TOTAL (REFLEX TO FREE): Prostate Specific Ag, Serum: 0.5 ng/mL (ref 0.0–4.0)

## 2024-09-13 LAB — SPECIMEN STATUS REPORT

## 2024-09-14 LAB — CALCIUM, URINE, 24 HOUR
Calcium, 24H Urine: 232 mg/(24.h) (ref 0–320)
Calcium, Urine: 29 mg/dL

## 2024-09-15 ENCOUNTER — Ambulatory Visit: Payer: Self-pay | Admitting: Family Medicine

## 2024-10-08 ENCOUNTER — Encounter: Payer: Self-pay | Admitting: Family Medicine

## 2024-10-14 ENCOUNTER — Other Ambulatory Visit: Payer: Self-pay | Admitting: Family Medicine

## 2024-10-14 DIAGNOSIS — I119 Hypertensive heart disease without heart failure: Secondary | ICD-10-CM

## 2025-02-14 ENCOUNTER — Ambulatory Visit: Admitting: Family Medicine

## 2025-07-29 ENCOUNTER — Ambulatory Visit: Admitting: Family Medicine
